# Patient Record
Sex: Female | Born: 1935 | Hispanic: No | Smoking: Former smoker
Health system: Southern US, Community
[De-identification: ages and names within clinical notes are randomized; demographics above are authoritative.]

## PROBLEM LIST (undated history)

## (undated) DIAGNOSIS — E785 Hyperlipidemia, unspecified: Secondary | ICD-10-CM

## (undated) DIAGNOSIS — Z7409 Other reduced mobility: Secondary | ICD-10-CM

## (undated) DIAGNOSIS — I10 Essential (primary) hypertension: Secondary | ICD-10-CM

## (undated) DIAGNOSIS — M4802 Spinal stenosis, cervical region: Secondary | ICD-10-CM

## (undated) DIAGNOSIS — R011 Cardiac murmur, unspecified: Secondary | ICD-10-CM

## (undated) DIAGNOSIS — R338 Other retention of urine: Secondary | ICD-10-CM

## (undated) DIAGNOSIS — Z741 Need for assistance with personal care: Secondary | ICD-10-CM

## (undated) DIAGNOSIS — K449 Diaphragmatic hernia without obstruction or gangrene: Secondary | ICD-10-CM

## (undated) DIAGNOSIS — R627 Adult failure to thrive: Secondary | ICD-10-CM

## (undated) DIAGNOSIS — I739 Peripheral vascular disease, unspecified: Secondary | ICD-10-CM

## (undated) DIAGNOSIS — M1990 Unspecified osteoarthritis, unspecified site: Secondary | ICD-10-CM

## (undated) DIAGNOSIS — D638 Anemia in other chronic diseases classified elsewhere: Secondary | ICD-10-CM

## (undated) DIAGNOSIS — R1312 Dysphagia, oropharyngeal phase: Secondary | ICD-10-CM

## (undated) DIAGNOSIS — M62838 Other muscle spasm: Secondary | ICD-10-CM

## (undated) DIAGNOSIS — M1711 Unilateral primary osteoarthritis, right knee: Secondary | ICD-10-CM

## (undated) DIAGNOSIS — M6281 Muscle weakness (generalized): Secondary | ICD-10-CM

## (undated) DIAGNOSIS — E440 Moderate protein-calorie malnutrition: Secondary | ICD-10-CM

## (undated) DIAGNOSIS — I621 Nontraumatic extradural hemorrhage: Secondary | ICD-10-CM

## (undated) DIAGNOSIS — I700 Atherosclerosis of aorta: Secondary | ICD-10-CM

## (undated) DIAGNOSIS — U071 COVID-19: Secondary | ICD-10-CM

## (undated) HISTORY — DX: Hyperlipidemia, unspecified: E78.5

## (undated) HISTORY — PX: ABDOMINAL HYSTERECTOMY: SHX81

## (undated) HISTORY — DX: Peripheral vascular disease, unspecified: I73.9

## (undated) HISTORY — DX: Unspecified osteoarthritis, unspecified site: M19.90

## (undated) HISTORY — PX: CHOLECYSTECTOMY: SHX55

## (undated) HISTORY — DX: Cardiac murmur, unspecified: R01.1

## (undated) HISTORY — DX: Essential (primary) hypertension: I10

## (undated) HISTORY — DX: Diaphragmatic hernia without obstruction or gangrene: K44.9

---

## 2006-09-19 ENCOUNTER — Ambulatory Visit: Payer: Self-pay | Admitting: Vascular Surgery

## 2007-03-19 ENCOUNTER — Ambulatory Visit: Payer: Self-pay | Admitting: Vascular Surgery

## 2007-09-17 ENCOUNTER — Ambulatory Visit: Payer: Self-pay | Admitting: Vascular Surgery

## 2008-03-24 ENCOUNTER — Ambulatory Visit: Payer: Self-pay | Admitting: Vascular Surgery

## 2008-08-25 ENCOUNTER — Ambulatory Visit: Payer: Self-pay | Admitting: Vascular Surgery

## 2009-03-18 ENCOUNTER — Ambulatory Visit: Payer: Self-pay | Admitting: Vascular Surgery

## 2009-09-16 ENCOUNTER — Ambulatory Visit: Payer: Self-pay | Admitting: Vascular Surgery

## 2010-03-09 ENCOUNTER — Ambulatory Visit: Payer: Self-pay | Admitting: Vascular Surgery

## 2010-08-09 NOTE — Consult Note (Signed)
VASCULAR SURGERY CONSULTATION   DOAA, KENDZIERSKI D  DOB:  1929-12-18                                       09/19/2006  ZHYQM#:[id number]   I saw Ms. Farney in the office today in consultation concerning her left  lower extremity claudication.  She was referred by Dr. Nicholos Johns.  She states that for approximately 6 months she has noted that the left  leg gets tired when she ambulates.  This occurs at a fairly short  distance.  Her symptoms appear to be mostly involving the thigh and  calf.  She has had minimal symptoms in the right leg with no specific  claudication symptoms on the right.  She has had no rest pain and no  history of nonhealing wounds.  She has had some arthritis pain in the  right knee.  Her symptoms have remained relatively stable over the last  6 months.   PAST MEDICAL HISTORY:  Significant for adult-onset diabetes,  hypertension, and hypercholesterolemia.  She denies any history of  previous myocardial infarction, a history of congestive heart failure,  or history of COPD.   PAST SURGICAL HISTORY:  Her past surgical history is significant for  previous hysterectomy and cholecystectomy.   FAMILY HISTORY:  On the family history, there is no history of premature  cardiovascular disease.   SOCIAL HISTORY:  On social history, she is widowed.  She smokes a pack  per day of cigarettes but quit three years ago.   REVIEW OF SYSTEMS AND MEDICATIONS:  Documented on the medical history  form in her chart.   PHYSICAL EXAMINATION:  Vital Signs:  On physical examination, blood  pressure is 179/78, heart rate is 62.  HEENT:  I did not detect any  carotid bruits.  Lungs:  Clear bilaterally to auscultation.  Cardiac:  On cardiac exam, she has a regular rate and rhythm.  Abdomen:  Her  abdomen is soft and nontender.  I could not palpate an aneurysm.  She  has a normal right femoral pulse.  I could not palpate a left femoral  pulse.  Extremities:  She has a  palpable popliteal and dorsalis pedis  pulse on the right.  She has brisk Doppler signals in the right foot.  On the left side, I could not palpate any popliteal or pedal pulses.   She had a Doppler study at [facility] on September 11, 2006 which  showed an ABI of 94% on the right and 53% on the left.  Of note, she has  no ischemic ulcers on her feet.   Based on her exam, she has essentially normal flow in the right leg.  On  the left side, she has evidence of iliac artery occlusive disease and  possible infrainguinal arterio-occlusive disease.  However, currently  her symptoms are quite stable and I would not recommend arteriography  and revascularization unless she developed disabling claudication, or  rest pain, or nonhealing ulcer.  We have discussed the importance of a  structured walking program and she is willing to give this a try.  I  plan on seeing her back in 6 months to follow her progress.  She knows  to call sooner if she has problems.  The other consideration will  cilostazol however for now she is content to trying the structured  walking program.   Di Kindle.  Edilia Bo, M.D.  Electronically Signed  CSD/MEDQ  D:  09/19/2006  T:  09/20/2006  Job:  [id number]   cc:   Georgianne Fick, M.D.

## 2010-08-09 NOTE — Consult Note (Signed)
NEW PATIENT CONSULTATION   Suzanne Fox, Suzanne Fox  DOB:  December 09, 1929                                       03/09/2010  ZOXWR#:[id number]   I saw this patient in the office today to evaluate her left leg  claudication.  This is a pleasant 77 year old woman with a long history  of claudication in the left leg.  She states that the left leg becomes  tired when she ambulates and this is relieved with rest.  She denies any  specific pain in he leg but says the leg simply gets tired.  This has  gone on for several years and heart rate symptoms have been fairly  stable over the last 2 years.  Symptoms are brought on by ambulation at  approximately 3 blocks.  Symptoms are relieved with rest.  There are no  other aggravating or alleviating factors.  She had no rest pain.  No  history of nonhealing wounds.  Her symptoms only involve the left leg.  She has had no symptoms on the right.   PAST MEDICAL HISTORY:  Significant for hypertension and  hypercholesterolemia.  She also has adult onset diabetes, but does not  require insulin.  She has had no previous history of myocardial  infarction, history of congestive heart failure, or history of COPD.   SOCIAL HISTORY:  She is widowed.  She does not have any children.  She  quit tobacco 5 years ago.   FAMILY HISTORY:  There is no history of premature cardiovascular  disease.   REVIEW OF SYSTEMS:  GENERAL:  She has had no recent weight loss, weight  gain or problems with her appetite.  CARDIOVASCULAR:  She has had no  chest pain, chest pressure, palpitations or arrhythmias.  She had no  claudication, rest pain or nonhealing ulcers.  She had no history of  stroke or TIAs.  She has had no history of DVT or phlebitis.  GI:  She  has a hiatal hernia and has also had problems with constipation.  NEUROLOGIC, PULMONARY, HEMATOLOGIC, GU, ENT, MUSCULOSKELETAL,  PSYCHIATRIC, INTEGUMENTARY review of systems is unremarkable as  documented in the  medical history form in her chart.   PHYSICAL EXAMINATION:  This is a pleasant 77 year old woman who appears  her stated age.  Blood pressure 162/74, heart rate is 55, saturation  98%.  HEENT:  Unremarkable.  Lungs:  Clear bilaterally to auscultation  without rales, rhonchi or wheezing.  Cardiovascular exam:  I do not  detect any carotid bruits.  She has a regular rate and rhythm.  She has  a palpable right femoral pulse.  I cannot palpate a left femoral pulse.  She has a palpable dorsalis pedis and posterior tibial pulse on the  right that is slightly diminished.  I cannot palpate pedal pulses on the  left side.  She has no significant lower extremity swelling.  Both feet  are warm and well-perfused without ischemic ulcers.  Abdomen:  Soft and nontender with normal pitched bowel sounds.  Musculoskeletal exam:  There is no major deformities or cyanosis.  Neurologic:  She has no focal weakness or paresthesias.  Skin:  There  are no ulcers or rashes.   I have independently interpreted her arterial Doppler study which shows  triphasic waveforms in the dorsalis pedis and posterior tibial positions  on the right with  an ABI of 86%.  On the left side she has monophasic  signals in the posterior tibial and dorsalis pedis positions with an ABI  of 59%.  Given her symptoms are stable,  I would not recommend an  aggressive workup unless she develops rest pain or nonhealing ulcer.  I  have encouraged her to stay as active as possible.  Fortunately she is  not a smoker.  I have ordered a follow-up Doppler study in 18 months and  I will see her back at that time.  She knows to call sooner if she has  problems.     Di Kindle. Edilia Bo, M.Fox.  Electronically Signed   CSD/MEDQ  Fox:  03/09/2010  T:  03/10/2010  Job:  [id number]

## 2010-09-07 ENCOUNTER — Ambulatory Visit: Payer: Medicare Other | Admitting: Vascular Surgery

## 2011-08-28 ENCOUNTER — Encounter: Payer: Self-pay | Admitting: Vascular Surgery

## 2011-09-06 ENCOUNTER — Ambulatory Visit: Payer: Medicare Other | Admitting: Neurosurgery

## 2014-01-08 ENCOUNTER — Encounter (INDEPENDENT_AMBULATORY_CARE_PROVIDER_SITE_OTHER): Payer: Self-pay

## 2014-01-08 ENCOUNTER — Encounter: Payer: Self-pay | Admitting: Neurology

## 2014-01-08 ENCOUNTER — Ambulatory Visit (INDEPENDENT_AMBULATORY_CARE_PROVIDER_SITE_OTHER): Payer: Medicare Other | Admitting: Neurology

## 2014-01-08 VITALS — BP 134/56 | HR 62 | Temp 96.8°F | Ht 60.0 in | Wt 132.0 lb

## 2014-01-08 DIAGNOSIS — H47011 Ischemic optic neuropathy, right eye: Secondary | ICD-10-CM

## 2014-01-08 NOTE — Progress Notes (Addendum)
[facility]    Provider:  Dr Jaynee Fox Referring Provider: Merrilee Seashore, MD Primary Care Physician:  Suzanne Seashore, MD  CC:  Something wrong with my right eye  HPI:  Suzanne Fox is a lovely 80 y.o. female with a PMHx of DM, HTN, arthritis, HLD, hypothyroidism who is here as a referral from Suzanne Fox for optic atrophy. She has been following with an ophthamologist due to a cataract. Patient reports that Suzanne. Kathrin Fox says there is something wrong with her right eye but doesn't know what it is. Has not noticed decreased vision in her right eye. No vision changes. Denies pain chewing, no headache, no stffnes in the shoulders or hips more than her usual arthritis. She feels like she sees well. Sometimes feels a little gritty in her right eye. Has not been in an MRi machine at all. Has diabetes. No hypercoagulability or FHx of this.  Per notes from Suzanne Fox, she under went removal of right eye mature cataract 06/2013. Since then it has become apparent that she has optic atrophy of the right eye. I spoke with Suzanne. Kathrin Fox and appreciate her time, advice and sincere concern for this lovely elderly patient. There is a FHx of glaucoma and blindness in an aunt. Left eye cataract was removed in 2006. Vision in her right eye was 20/400 previous to removing the cataract and 20/20-2 afterwards, left eye 20/25. Visual field test in her right eye showed diffuse and dense visual field depression with some relative sparing of central field. OCT testing showed a markedly thinned retinal nerve fiber layer in right and normal in the left, thinned ganglion cell layer in the macula of the right eye and normal in the left. Intraocular pressures normal in both eyes and no history of elevated pressures or glaucoma. Dilated fundus exam showed diffuse pallor and moderate optic disc cupping in the right eye. No diabetic retinopathy but mildly attenuated retinal vasculature due to  HTN.    No labs available.   Review of Systems: Patient complains of symptoms per HPI as well as the following symptoms blurred vision, easy bruising, constipation, cramps, joint pain. Pertinent negatives per HPI. All others negative.   History   Social History  . Marital Status: Widowed    Spouse Name: N/A    Number of Children: 0  . Years of Education: 12   Occupational History  . Retired    Social History Main Topics  . Smoking status: Former Smoker -- 1.00 packs/day for 50 years    Types: Cigarettes    Quit date: 08/27/2004  . Smokeless tobacco: Never Used  . Alcohol Use: No  . Drug Use: No  . Sexual Activity: Not on file   Other Topics Concern  . Not on file   Social History Narrative   Patient lives at home alone.   Caffeine use: 4 cups daily    Family History  Problem Relation Age of Onset  . Other Mother     At childbirth  . Prostate cancer Father   . Asthma Sister   . Diabetes Sister   . Cancer Maternal Aunt   . Diabetes Sister   . Diabetes Sister     Past Medical History  Diagnosis Date  . Hypertension   . Diabetes mellitus   . Hyperlipidemia   . Hiatal hernia   . Peripheral vascular disease   . Arthritis     Past Surgical History  Procedure Laterality Date  . Cholecystectomy    .  Abdominal hysterectomy      Current Outpatient Prescriptions  Medication Sig Dispense Refill  . amLODipine (NORVASC) 10 MG tablet Take 10 mg by mouth daily.      Marland Kitchen aspirin EC 81 MG tablet Take 81 mg by mouth every other day.      Marland Kitchen atenolol-chlorthalidone (TENORETIC) 50-25 MG per tablet Take 1 tablet by mouth daily.      Marland Kitchen atorvastatin (LIPITOR) 40 MG tablet Take 40 mg by mouth daily.      . cilostazol (PLETAL) 50 MG tablet Take 50 mg by mouth 2 (two) times daily.      . clobetasol cream (TEMOVATE) 6.78 % Apply 1 application topically 2 (two) times daily.      Marland Kitchen glipiZIDE (GLUCOTROL) 10 MG tablet Take 20 mg by mouth daily.       Marland Kitchen guanFACINE (TENEX) 1 MG  tablet Take 2 mg by mouth at bedtime.      Marland Kitchen levothyroxine (SYNTHROID, LEVOTHROID) 25 MCG tablet Take 25 mcg by mouth daily.      Marland Kitchen lisinopril (PRINIVIL,ZESTRIL) 40 MG tablet Take 80 mg by mouth daily.       . metFORMIN (GLUCOPHAGE-XR) 500 MG 24 hr tablet Take 1,000 mg by mouth 2 (two) times daily.        No current facility-administered medications for this visit.    Allergies as of 01/08/2014  . (No Known Allergies)    Vitals: BP 134/56  Pulse 62  Temp(Src) 96.8 F (36 C) (Oral)  Ht 5' (1.524 m)  Wt 132 lb (59.875 kg)  BMI 25.78 kg/m2 Last Weight:  Wt Readings from Last 1 Encounters:  01/08/14 132 lb (59.875 kg)   Last Height:   Ht Readings from Last 1 Encounters:  01/08/14 5' (1.524 m)    Physical exam: Exam: Gen: NAD, conversant, well nourised, well groomed                     CV: RRR, no MRG. No Carotid Bruits. No peripheral edema, warm, nontender Eyes: Conjunctivae clear without exudates or hemorrhage  Neuro: Detailed Neurologic Exam  Speech:    Speech is normal; fluent and spontaneous with normal comprehension.  Cognition:    The patient is oriented to person, place, and time;     recent and remote memory intact;     language fluent;     normal attention, concentration,    fund of knowledge Cranial Nerves:    Right APD.  Pale right optic nerve. Visual fields were impaired bilaterally, she could not see fingers when looking at my nose however she did look in the correct quadrant indicating she does have some intact peripheral vision. 20/30 bilaterally.  Extraocular movements are intact. Trigeminal sensation is intact and the muscles of mastication are normal. The face is symmetric. The palate elevates in the midline. Voice is normal. Shoulder shrug is normal. The tongue has normal motion without fasciculations.   Coordination:    Normal finger to nose, no dysmetria noted  Gait:    Slow, mildly wide based  Motor Observation:    No asymmetry, no atrophy, and  no involuntary movements noted.  Tone:    Normal muscle tone.    Posture:    Posture is stooped due to kyphosis    Strength:    Strength is V/V in the upper and lower limbs.      Sensation: intact to LT     Reflex Exam:  DTR's:    Right achilles  absent, otherwise deep tendon reflexes in the upper and lower extremities are normal bilaterally.   Toes:    Left toe upgoing? Clonus:    Clonus is absent.    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 Denies prodrome of systemic symptoms known as polymyalgia rheumatica, which include jaw claudication, proximal myalgias and arthralgias, scalp tenderness, headache, and fatigue. Denies dizziness or any hx of hypotension. Will order carotid dopplers to rule out atherosclerotic carotid disease. Will order MRI of the brain to rule out any compressive etiology. Need records from her primary care to see if they have checked esr or crp recently and will need renal function for contrast.    Sarina Ill, MD  [facility] [address] [place], [place] [address]  Phone [phone] Fax [phone]    Lenor Coffin

## 2014-01-08 NOTE — Patient Instructions (Signed)
As far as diagnostic testing: MRI of the brain and eyes and MRA of the vessels  I would like to see you back after results to discuss, sooner if we need to. Please call us with any interim questions, concerns, problems, updates or refill requests.   Please also call us for any test results so we can go over those with you on the phone.  My clinical assistant and will answer any of your questions and relay your messages to me and also relay most of my messages to you.   Our phone number is [phone]. We also have an after hours call service for urgent matters and there is a physician on-call for urgent questions. For any emergencies you know to call 911 or go to the nearest emergency room

## 2014-01-11 ENCOUNTER — Telehealth: Payer: Self-pay | Admitting: Neurology

## 2014-01-11 ENCOUNTER — Encounter: Payer: Self-pay | Admitting: Neurology

## 2014-01-11 DIAGNOSIS — H47019 Ischemic optic neuropathy, unspecified eye: Secondary | ICD-10-CM | POA: Insufficient documentation

## 2014-01-11 NOTE — Telephone Encounter (Signed)
Larey Seat - Would you call Dr. Mathis Fare office and get the most recent labs for this patient please? Will need renal function (so a BMP or a CMP) and an esr and crp. This is her PCP and he referred her here in conjunction with Dr. Kathrin Penner for her optic atrophy. If they don't have recent labs, let me know and we will have her back to get them done. Thanks.

## 2014-01-12 ENCOUNTER — Other Ambulatory Visit: Payer: Self-pay | Admitting: Neurology

## 2014-01-12 ENCOUNTER — Telehealth: Payer: Self-pay | Admitting: Neurology

## 2014-01-12 DIAGNOSIS — H4720 Unspecified optic atrophy: Secondary | ICD-10-CM

## 2014-01-12 DIAGNOSIS — H47011 Ischemic optic neuropathy, right eye: Secondary | ICD-10-CM

## 2014-01-12 NOTE — Telephone Encounter (Signed)
Called patient. Went over Dr. Trevor MaceAhern's previous note. Patient agreed to come to our office for labs by the end of this week.

## 2014-01-12 NOTE — Telephone Encounter (Signed)
Notes received

## 2014-01-12 NOTE — Telephone Encounter (Signed)
Larey Seat - would you please call this lovely patient and let her know that after reviewing the labs that she had done at Dr. Mathis Fare office, there are 2 more we really need. Would you have her go to [facility] or come here at her earliest convenience please? (ESR and CRP) Thank you.

## 2014-01-12 NOTE — Telephone Encounter (Signed)
Dr. Lucia GaskinsAhern spoke with Dr. Carolyn Stareamachandran's nurse. She will fax info needed.

## 2014-01-15 ENCOUNTER — Other Ambulatory Visit (INDEPENDENT_AMBULATORY_CARE_PROVIDER_SITE_OTHER): Payer: Medicare Other

## 2014-01-15 ENCOUNTER — Ambulatory Visit (INDEPENDENT_AMBULATORY_CARE_PROVIDER_SITE_OTHER): Payer: Medicare Other

## 2014-01-15 ENCOUNTER — Other Ambulatory Visit: Payer: Self-pay | Admitting: Neurology

## 2014-01-15 DIAGNOSIS — H4720 Unspecified optic atrophy: Secondary | ICD-10-CM

## 2014-01-15 DIAGNOSIS — H47011 Ischemic optic neuropathy, right eye: Secondary | ICD-10-CM

## 2014-01-15 DIAGNOSIS — Z0289 Encounter for other administrative examinations: Secondary | ICD-10-CM

## 2014-01-16 LAB — SEDIMENTATION RATE: Sed Rate: 5 mm/hr (ref 0–40)

## 2014-01-16 LAB — BASIC METABOLIC PANEL
BUN/Creatinine Ratio: 13 (ref 11–26)
BUN: 8 mg/dL (ref 8–27)
CO2: 24 mmol/L (ref 18–29)
Calcium: 10.1 mg/dL (ref 8.7–10.3)
Chloride: 95 mmol/L — ABNORMAL LOW (ref 97–108)
Creatinine, Ser: 0.63 mg/dL (ref 0.57–1.00)
GFR calc Af Amer: 97 mL/min/{1.73_m2} (ref >59)
GFR calc non Af Amer: 84 mL/min/{1.73_m2} (ref >59)
Glucose: 122 mg/dL — ABNORMAL HIGH (ref 65–99)
Potassium: 4.2 mmol/L (ref 3.5–5.2)
Sodium: 136 mmol/L (ref 134–144)

## 2014-01-16 LAB — C-REACTIVE PROTEIN: CRP: 0.4 mg/L (ref 0.0–4.9)

## 2014-01-21 ENCOUNTER — Ambulatory Visit
Admission: RE | Admit: 2014-01-21 | Discharge: 2014-01-21 | Disposition: A | Discharge status: Home / Self Care | Payer: 59 | Source: Ambulatory Visit | Attending: Neurology | Admitting: Neurology

## 2014-01-21 ENCOUNTER — Telehealth: Payer: Self-pay

## 2014-01-21 DIAGNOSIS — H47011 Ischemic optic neuropathy, right eye: Secondary | ICD-10-CM

## 2014-01-21 MED ORDER — GADOBENATE DIMEGLUMINE 529 MG/ML IV SOLN
12.0000 mL | Freq: Once | INTRAVENOUS | Status: AC | PRN
  Administered 2014-01-21: 12 mL via INTRAVENOUS

## 2014-01-21 NOTE — Telephone Encounter (Signed)
Message copied by Doree BarthelLOWE, Jedadiah Abdallah on Wed Jan 21, 2014 11:47 AM ------      Message from: Naomie DeanAHERN, ANTONIA B      Created: Fri Jan 16, 2014  2:39 PM       Would you let patient know that her lab tests done in our office were normal. Also remind her that we ordered images of her brain (MRIs). Thank you. ------

## 2014-01-21 NOTE — Telephone Encounter (Signed)
Called patient. No answer. Will try later.  

## 2014-01-22 NOTE — Telephone Encounter (Signed)
Called patient.  No answer.

## 2014-01-28 ENCOUNTER — Telehealth: Payer: Self-pay

## 2014-01-28 NOTE — Telephone Encounter (Signed)
-----   Message from Anson FretAntonia B Ahern, MD sent at 01/26/2014 11:14 AM EST ----- Please let patient know that her MRIs were not unremarkable, not concerning for any significant issues.

## 2014-01-28 NOTE — Telephone Encounter (Signed)
Called patient and gave results.

## 2014-02-18 ENCOUNTER — Telehealth: Payer: Self-pay | Admitting: Neurology

## 2014-02-18 NOTE — Telephone Encounter (Signed)
Reviewed with Dr. Pearlean BrownieSethi and called patient. There is stenosis in the external carotid artery but there is no intervention for this and it does not increase stroke risk or cause patient's symptoms (optic neuropathy).

## 2017-08-23 ENCOUNTER — Telehealth: Payer: Self-pay | Admitting: Pharmacist

## 2017-08-23 NOTE — Patient Outreach (Signed)
[facility]) Care Management  08/23/2017  Suzanne Fox [birth year] [id number]  84 year old female on triple-fail measure medication adherence list from [facility].  Per [facility] records, the following medications are due to be refilled.      glipizide: last filled 05/24/2017 #30 DS $9:60  lisinopril  last filled 05/24/2017 #30 DS: $7.86  atorvastatin: last filled 05/25/2017 #30 DS $9.60  Unsuccessful call placed to Ms. Suzanne Fox.  Home phone number on file was no longer in service.  No other phone number noted in [facility].  Call placed to [facility] and new contact information given.  Successful call to Ms. Suzanne Fox on new home phone number.  HIPAA identifiers verified.   Patient reports that she is filling her medications but due to a "fixed income" she cannot afford more than 30 day fills at a time.  We reviewed cost-savings with 90 day supplies and with mail order.  Patient voiced understanding.  Call placed to [facility] to confirm medications were filled and all were ready to be picked up.  Based on pricing, patient may be in the coverage gap.  Patient plans to pick up medications within the next few days.     Plan: I will close [facility] pharmacy case.  No further intervention warranted at this time.    Haynes Hoehn, PharmD, [facility] Clinical Pharmacist [facility] [employer] [phone]

## 2019-09-05 ENCOUNTER — Ambulatory Visit: Payer: Medicare PPO | Admitting: Podiatry

## 2019-09-05 ENCOUNTER — Other Ambulatory Visit: Payer: Self-pay

## 2019-09-05 ENCOUNTER — Encounter: Payer: Self-pay | Admitting: Podiatry

## 2019-09-05 DIAGNOSIS — M79675 Pain in left toe(s): Secondary | ICD-10-CM | POA: Diagnosis not present

## 2019-09-05 DIAGNOSIS — M79674 Pain in right toe(s): Secondary | ICD-10-CM

## 2019-09-05 DIAGNOSIS — B351 Tinea unguium: Secondary | ICD-10-CM

## 2019-09-05 NOTE — Progress Notes (Signed)
This patient returns to my office for at risk foot care.  This patient requires this care by a professional since this patient will be at risk due to having  Diabetes.  This patient presents to the office with her niece.   This patient is unable to cut nails himself since the patient cannot reach his nails.These nails are painful walking and wearing shoes.  This patient presents for at risk foot care today.  General Appearance  Alert, conversant and in no acute stress.  Vascular  Dorsalis pedis are weakly  pulses are palpable  Bilaterally. Posterior tibial pulses are not palpable  B/L.   Capillary return is within normal limits  bilaterally. Temperature is within normal limits  bilaterally.  Neurologic  Senn-Weinstein monofilament wire test within normal limits  bilaterally. Muscle power within normal limits bilaterally.  Nails Thick disfigured discolored nails with subungual debris  from hallux to fifth toes bilaterally. No evidence of bacterial infection or drainage bilaterally.  Orthopedic  No limitations of motion  feet .  No crepitus or effusions noted.  No bony pathology or digital deformities noted. HAV  B/L.  Pes planus  B/L.  Skin  normotropic skin with no porokeratosis noted bilaterally.  No signs of infections or ulcers noted.     Onychomycosis  Pain in right toes  Pain in left toes  Consent was obtained for treatment procedures.   Mechanical debridement of nails 1-5  bilaterally performed with a nail nipper.  Filed with dremel without incident.    Return office visit  3 months                    Told patient to return for periodic foot care and evaluation due to potential at risk complications.   Helane Gunther DPM

## 2019-09-24 ENCOUNTER — Ambulatory Visit: Payer: Medicare PPO | Admitting: Podiatry

## 2019-12-12 ENCOUNTER — Ambulatory Visit: Payer: Medicare PPO | Admitting: Podiatry

## 2019-12-12 ENCOUNTER — Encounter: Payer: Self-pay | Admitting: Podiatry

## 2019-12-12 ENCOUNTER — Other Ambulatory Visit: Payer: Self-pay

## 2019-12-12 DIAGNOSIS — M79674 Pain in right toe(s): Secondary | ICD-10-CM | POA: Diagnosis not present

## 2019-12-12 DIAGNOSIS — M79675 Pain in left toe(s): Secondary | ICD-10-CM | POA: Diagnosis not present

## 2019-12-12 DIAGNOSIS — B351 Tinea unguium: Secondary | ICD-10-CM | POA: Diagnosis not present

## 2019-12-12 NOTE — Progress Notes (Signed)
This patient returns to my office for at risk foot care.  This patient requires this care by a professional since this patient will be at risk due to having  Diabetes.  This patient presents to the office with her niece.    This patient is unable to cut nails herself since the patient cannot reach her nails.These nails are painful walking and wearing shoes.  This patient presents for at risk foot care today.  General Appearance  Alert, conversant and in no acute stress.  Vascular  Dorsalis pedis are weakly  pulses are palpable  Bilaterally. Posterior tibial pulses are not palpable  B/L.   Capillary return is within normal limits  bilaterally. Temperature is within normal limits  bilaterally.  Neurologic  Senn-Weinstein monofilament wire test within normal limits  bilaterally. Muscle power within normal limits bilaterally.  Nails Thick disfigured discolored nails with subungual debris  from hallux to fifth toes bilaterally. No evidence of bacterial infection or drainage bilaterally.  Orthopedic  No limitations of motion  feet .  No crepitus or effusions noted.  No bony pathology or digital deformities noted. HAV  B/L.  Pes planus  B/L.  Skin  normotropic skin with no porokeratosis noted bilaterally.  No signs of infections or ulcers noted.     Onychomycosis  Pain in right toes  Pain in left toes  Consent was obtained for treatment procedures.   Mechanical debridement of nails 1-5  bilaterally performed with a nail nipper.  Filed with dremel without incident.    Return office visit  3 months                    Told patient to return for periodic foot care and evaluation due to potential at risk complications.   Helane Gunther DPM

## 2020-03-16 ENCOUNTER — Other Ambulatory Visit: Payer: Self-pay

## 2020-03-16 ENCOUNTER — Ambulatory Visit (INDEPENDENT_AMBULATORY_CARE_PROVIDER_SITE_OTHER): Payer: Medicare PPO | Admitting: Podiatry

## 2020-03-16 DIAGNOSIS — B351 Tinea unguium: Secondary | ICD-10-CM

## 2020-03-16 DIAGNOSIS — M79674 Pain in right toe(s): Secondary | ICD-10-CM

## 2020-03-16 DIAGNOSIS — M79675 Pain in left toe(s): Secondary | ICD-10-CM

## 2020-03-23 DIAGNOSIS — H34832 Tributary (branch) retinal vein occlusion, left eye, with macular edema: Secondary | ICD-10-CM | POA: Diagnosis not present

## 2020-03-23 DIAGNOSIS — H35373 Puckering of macula, bilateral: Secondary | ICD-10-CM | POA: Diagnosis not present

## 2020-03-23 DIAGNOSIS — H35033 Hypertensive retinopathy, bilateral: Secondary | ICD-10-CM | POA: Diagnosis not present

## 2020-03-23 DIAGNOSIS — H35361 Drusen (degenerative) of macula, right eye: Secondary | ICD-10-CM | POA: Diagnosis not present

## 2020-03-24 ENCOUNTER — Encounter: Payer: Self-pay | Admitting: Podiatry

## 2020-03-24 NOTE — Progress Notes (Signed)
Pt. Left without being seen

## 2020-04-14 ENCOUNTER — Ambulatory Visit: Payer: Medicare PPO | Admitting: Podiatry

## 2020-06-09 DIAGNOSIS — I10 Essential (primary) hypertension: Secondary | ICD-10-CM | POA: Diagnosis not present

## 2020-06-09 DIAGNOSIS — R5383 Other fatigue: Secondary | ICD-10-CM | POA: Diagnosis not present

## 2020-06-09 DIAGNOSIS — E1165 Type 2 diabetes mellitus with hyperglycemia: Secondary | ICD-10-CM | POA: Diagnosis not present

## 2020-06-09 DIAGNOSIS — M542 Cervicalgia: Secondary | ICD-10-CM | POA: Diagnosis not present

## 2020-06-10 ENCOUNTER — Emergency Department (HOSPITAL_COMMUNITY): Payer: Medicare PPO

## 2020-06-10 ENCOUNTER — Inpatient Hospital Stay (HOSPITAL_COMMUNITY)
Admission: EM | Admit: 2020-06-10 | Discharge: 2020-06-18 | DRG: 471 | Disposition: A | Discharge status: Skilled Nursing Facility | Payer: Medicare PPO | Attending: Family Medicine | Admitting: Family Medicine

## 2020-06-10 ENCOUNTER — Encounter (HOSPITAL_COMMUNITY): Payer: Self-pay | Admitting: Emergency Medicine

## 2020-06-10 ENCOUNTER — Observation Stay (HOSPITAL_COMMUNITY): Payer: Medicare PPO | Admitting: Anesthesiology

## 2020-06-10 ENCOUNTER — Observation Stay (HOSPITAL_COMMUNITY): Payer: Medicare PPO

## 2020-06-10 ENCOUNTER — Ambulatory Visit (HOSPITAL_COMMUNITY)
Admission: EM | Disposition: A | Discharge status: Skilled Nursing Facility | Payer: Self-pay | Source: Home / Self Care | Attending: Pulmonary Disease

## 2020-06-10 ENCOUNTER — Other Ambulatory Visit: Payer: Self-pay

## 2020-06-10 DIAGNOSIS — G8314 Monoplegia of lower limb affecting left nondominant side: Secondary | ICD-10-CM | POA: Diagnosis present

## 2020-06-10 DIAGNOSIS — E876 Hypokalemia: Secondary | ICD-10-CM | POA: Diagnosis present

## 2020-06-10 DIAGNOSIS — R339 Retention of urine, unspecified: Secondary | ICD-10-CM | POA: Diagnosis present

## 2020-06-10 DIAGNOSIS — D638 Anemia in other chronic diseases classified elsewhere: Secondary | ICD-10-CM | POA: Diagnosis present

## 2020-06-10 DIAGNOSIS — Z20822 Contact with and (suspected) exposure to covid-19: Secondary | ICD-10-CM | POA: Diagnosis present

## 2020-06-10 DIAGNOSIS — R531 Weakness: Secondary | ICD-10-CM | POA: Diagnosis not present

## 2020-06-10 DIAGNOSIS — I10 Essential (primary) hypertension: Secondary | ICD-10-CM | POA: Diagnosis present

## 2020-06-10 DIAGNOSIS — G8191 Hemiplegia, unspecified affecting right dominant side: Secondary | ICD-10-CM | POA: Diagnosis present

## 2020-06-10 DIAGNOSIS — S064X9A Epidural hemorrhage with loss of consciousness of unspecified duration, initial encounter: Secondary | ICD-10-CM | POA: Diagnosis present

## 2020-06-10 DIAGNOSIS — I621 Nontraumatic extradural hemorrhage: Secondary | ICD-10-CM | POA: Diagnosis not present

## 2020-06-10 DIAGNOSIS — Z87891 Personal history of nicotine dependence: Secondary | ICD-10-CM | POA: Diagnosis not present

## 2020-06-10 DIAGNOSIS — M7551 Bursitis of right shoulder: Secondary | ICD-10-CM | POA: Diagnosis present

## 2020-06-10 DIAGNOSIS — S14109A Unspecified injury at unspecified level of cervical spinal cord, initial encounter: Secondary | ICD-10-CM | POA: Diagnosis present

## 2020-06-10 DIAGNOSIS — G9589 Other specified diseases of spinal cord: Secondary | ICD-10-CM | POA: Diagnosis not present

## 2020-06-10 DIAGNOSIS — F0390 Unspecified dementia without behavioral disturbance: Secondary | ICD-10-CM | POA: Diagnosis present

## 2020-06-10 DIAGNOSIS — M4802 Spinal stenosis, cervical region: Secondary | ICD-10-CM | POA: Diagnosis present

## 2020-06-10 DIAGNOSIS — M47812 Spondylosis without myelopathy or radiculopathy, cervical region: Secondary | ICD-10-CM | POA: Diagnosis not present

## 2020-06-10 DIAGNOSIS — H9190 Unspecified hearing loss, unspecified ear: Secondary | ICD-10-CM | POA: Diagnosis present

## 2020-06-10 DIAGNOSIS — Z741 Need for assistance with personal care: Secondary | ICD-10-CM | POA: Diagnosis not present

## 2020-06-10 DIAGNOSIS — Z888 Allergy status to other drugs, medicaments and biological substances status: Secondary | ICD-10-CM

## 2020-06-10 DIAGNOSIS — E785 Hyperlipidemia, unspecified: Secondary | ICD-10-CM | POA: Diagnosis present

## 2020-06-10 DIAGNOSIS — E43 Unspecified severe protein-calorie malnutrition: Secondary | ICD-10-CM | POA: Insufficient documentation

## 2020-06-10 DIAGNOSIS — E039 Hypothyroidism, unspecified: Secondary | ICD-10-CM | POA: Diagnosis present

## 2020-06-10 DIAGNOSIS — Z419 Encounter for procedure for purposes other than remedying health state, unspecified: Secondary | ICD-10-CM

## 2020-06-10 DIAGNOSIS — X58XXXA Exposure to other specified factors, initial encounter: Secondary | ICD-10-CM | POA: Diagnosis present

## 2020-06-10 DIAGNOSIS — E1151 Type 2 diabetes mellitus with diabetic peripheral angiopathy without gangrene: Secondary | ICD-10-CM | POA: Diagnosis present

## 2020-06-10 DIAGNOSIS — J9601 Acute respiratory failure with hypoxia: Secondary | ICD-10-CM | POA: Diagnosis not present

## 2020-06-10 DIAGNOSIS — Z79890 Hormone replacement therapy (postmenopausal): Secondary | ICD-10-CM

## 2020-06-10 DIAGNOSIS — E1165 Type 2 diabetes mellitus with hyperglycemia: Secondary | ICD-10-CM | POA: Diagnosis present

## 2020-06-10 DIAGNOSIS — S46119A Strain of muscle, fascia and tendon of long head of biceps, unspecified arm, initial encounter: Secondary | ICD-10-CM | POA: Diagnosis present

## 2020-06-10 DIAGNOSIS — M25511 Pain in right shoulder: Secondary | ICD-10-CM | POA: Diagnosis not present

## 2020-06-10 DIAGNOSIS — E871 Hypo-osmolality and hyponatremia: Secondary | ICD-10-CM | POA: Diagnosis present

## 2020-06-10 DIAGNOSIS — Z4682 Encounter for fitting and adjustment of non-vascular catheter: Secondary | ICD-10-CM | POA: Diagnosis not present

## 2020-06-10 DIAGNOSIS — M2550 Pain in unspecified joint: Secondary | ICD-10-CM | POA: Diagnosis not present

## 2020-06-10 DIAGNOSIS — M6281 Muscle weakness (generalized): Secondary | ICD-10-CM | POA: Diagnosis not present

## 2020-06-10 DIAGNOSIS — Z833 Family history of diabetes mellitus: Secondary | ICD-10-CM | POA: Diagnosis not present

## 2020-06-10 DIAGNOSIS — E782 Mixed hyperlipidemia: Secondary | ICD-10-CM | POA: Diagnosis not present

## 2020-06-10 DIAGNOSIS — Z7982 Long term (current) use of aspirin: Secondary | ICD-10-CM | POA: Diagnosis not present

## 2020-06-10 DIAGNOSIS — Z48811 Encounter for surgical aftercare following surgery on the nervous system: Secondary | ICD-10-CM | POA: Diagnosis not present

## 2020-06-10 DIAGNOSIS — I959 Hypotension, unspecified: Secondary | ICD-10-CM | POA: Diagnosis not present

## 2020-06-10 DIAGNOSIS — Z981 Arthrodesis status: Secondary | ICD-10-CM | POA: Diagnosis not present

## 2020-06-10 DIAGNOSIS — E860 Dehydration: Secondary | ICD-10-CM | POA: Diagnosis not present

## 2020-06-10 DIAGNOSIS — Z825 Family history of asthma and other chronic lower respiratory diseases: Secondary | ICD-10-CM

## 2020-06-10 DIAGNOSIS — J988 Other specified respiratory disorders: Secondary | ICD-10-CM | POA: Diagnosis not present

## 2020-06-10 DIAGNOSIS — Z043 Encounter for examination and observation following other accident: Secondary | ICD-10-CM | POA: Diagnosis not present

## 2020-06-10 DIAGNOSIS — R509 Fever, unspecified: Secondary | ICD-10-CM | POA: Diagnosis not present

## 2020-06-10 DIAGNOSIS — Z79899 Other long term (current) drug therapy: Secondary | ICD-10-CM | POA: Diagnosis not present

## 2020-06-10 DIAGNOSIS — M4322 Fusion of spine, cervical region: Secondary | ICD-10-CM | POA: Diagnosis not present

## 2020-06-10 DIAGNOSIS — T68XXXA Hypothermia, initial encounter: Secondary | ICD-10-CM | POA: Diagnosis present

## 2020-06-10 DIAGNOSIS — Z7409 Other reduced mobility: Secondary | ICD-10-CM | POA: Diagnosis not present

## 2020-06-10 DIAGNOSIS — Z7401 Bed confinement status: Secondary | ICD-10-CM | POA: Diagnosis not present

## 2020-06-10 DIAGNOSIS — Z6822 Body mass index (BMI) 22.0-22.9, adult: Secondary | ICD-10-CM

## 2020-06-10 DIAGNOSIS — S064XAA Epidural hemorrhage with loss of consciousness status unknown, initial encounter: Secondary | ICD-10-CM | POA: Diagnosis present

## 2020-06-10 DIAGNOSIS — R5381 Other malaise: Secondary | ICD-10-CM | POA: Diagnosis not present

## 2020-06-10 DIAGNOSIS — Z4659 Encounter for fitting and adjustment of other gastrointestinal appliance and device: Secondary | ICD-10-CM

## 2020-06-10 DIAGNOSIS — M67813 Other specified disorders of tendon, right shoulder: Secondary | ICD-10-CM | POA: Diagnosis not present

## 2020-06-10 HISTORY — PX: POSTERIOR CERVICAL FUSION/FORAMINOTOMY: SHX5038

## 2020-06-10 HISTORY — DX: Hypothyroidism, unspecified: E03.9

## 2020-06-10 HISTORY — DX: Unspecified dementia, unspecified severity, without behavioral disturbance, psychotic disturbance, mood disturbance, and anxiety: F03.90

## 2020-06-10 LAB — RESP PANEL BY RT-PCR (FLU A&B, COVID) ARPGX2
Influenza A by PCR: NEGATIVE
Influenza B by PCR: NEGATIVE
SARS Coronavirus 2 by RT PCR: NEGATIVE

## 2020-06-10 LAB — POCT I-STAT 7, (LYTES, BLD GAS, ICA,H+H)
Acid-Base Excess: 1 mmol/L (ref 0.0–2.0)
Bicarbonate: 26.1 mmol/L (ref 20.0–28.0)
Calcium, Ion: 1.07 mmol/L — ABNORMAL LOW (ref 1.15–1.40)
HCT: 27 % — ABNORMAL LOW (ref 36.0–46.0)
Hemoglobin: 9.2 g/dL — ABNORMAL LOW (ref 12.0–15.0)
O2 Saturation: 100 %
Patient temperature: 32.7
Potassium: 2.5 mmol/L — CL (ref 3.5–5.1)
Sodium: 132 mmol/L — ABNORMAL LOW (ref 135–145)
TCO2: 27 mmol/L (ref 22–32)
pCO2 arterial: 35.8 mmHg (ref 32.0–48.0)
pH, Arterial: 7.452 — ABNORMAL HIGH (ref 7.350–7.450)
pO2, Arterial: 305 mmHg — ABNORMAL HIGH (ref 83.0–108.0)

## 2020-06-10 LAB — URINALYSIS, ROUTINE W REFLEX MICROSCOPIC
Bilirubin Urine: NEGATIVE
Glucose, UA: NEGATIVE mg/dL
Hgb urine dipstick: NEGATIVE
Ketones, ur: 5 mg/dL — AB
Leukocytes,Ua: NEGATIVE
Nitrite: NEGATIVE
Protein, ur: NEGATIVE mg/dL
Specific Gravity, Urine: 1.011 (ref 1.005–1.030)
pH: 5 (ref 5.0–8.0)

## 2020-06-10 LAB — BASIC METABOLIC PANEL
Anion gap: 16 — ABNORMAL HIGH (ref 5–15)
BUN: 15 mg/dL (ref 8–23)
CO2: 19 mmol/L — ABNORMAL LOW (ref 22–32)
Calcium: 9.8 mg/dL (ref 8.9–10.3)
Chloride: 93 mmol/L — ABNORMAL LOW (ref 98–111)
Creatinine, Ser: 0.72 mg/dL (ref 0.44–1.00)
GFR, Estimated: >60 mL/min (ref >60)
Glucose, Bld: 184 mg/dL — ABNORMAL HIGH (ref 70–99)
Potassium: 3.6 mmol/L (ref 3.5–5.1)
Sodium: 128 mmol/L — ABNORMAL LOW (ref 135–145)

## 2020-06-10 LAB — POCT I-STAT, CHEM 8
BUN: 8 mg/dL (ref 8–23)
Calcium, Ion: 1.02 mmol/L — ABNORMAL LOW (ref 1.15–1.40)
Chloride: 89 mmol/L — ABNORMAL LOW (ref 98–111)
Creatinine, Ser: 0.3 mg/dL — ABNORMAL LOW (ref 0.44–1.00)
Glucose, Bld: 205 mg/dL — ABNORMAL HIGH (ref 70–99)
HCT: 29 % — ABNORMAL LOW (ref 36.0–46.0)
Hemoglobin: 9.9 g/dL — ABNORMAL LOW (ref 12.0–15.0)
Potassium: 2.6 mmol/L — CL (ref 3.5–5.1)
Sodium: 130 mmol/L — ABNORMAL LOW (ref 135–145)
TCO2: 24 mmol/L (ref 22–32)

## 2020-06-10 LAB — GLUCOSE, CAPILLARY
Glucose-Capillary: 186 mg/dL — ABNORMAL HIGH (ref 70–99)
Glucose-Capillary: 209 mg/dL — ABNORMAL HIGH (ref 70–99)

## 2020-06-10 LAB — ABO/RH
ABO/RH(D): B POS
ABO/RH(D): B POS

## 2020-06-10 LAB — CBC
HCT: 35.4 % — ABNORMAL LOW (ref 36.0–46.0)
Hemoglobin: 11.1 g/dL — ABNORMAL LOW (ref 12.0–15.0)
MCH: 27.1 pg (ref 26.0–34.0)
MCHC: 31.4 g/dL (ref 30.0–36.0)
MCV: 86.6 fL (ref 80.0–100.0)
Platelets: 210 10*3/uL (ref 150–400)
RBC: 4.09 MIL/uL (ref 3.87–5.11)
RDW: 13.5 % (ref 11.5–15.5)
WBC: 9.5 10*3/uL (ref 4.0–10.5)
nRBC: 0 % (ref 0.0–0.2)

## 2020-06-10 LAB — RAPID URINE DRUG SCREEN, HOSP PERFORMED
Amphetamines: NOT DETECTED
Barbiturates: NOT DETECTED
Benzodiazepines: NOT DETECTED
Cocaine: NOT DETECTED
Opiates: POSITIVE — AB
Tetrahydrocannabinol: NOT DETECTED

## 2020-06-10 LAB — PROTIME-INR
INR: 1.1 (ref 0.8–1.2)
Prothrombin Time: 13.3 seconds (ref 11.4–15.2)

## 2020-06-10 LAB — APTT: aPTT: 32 seconds (ref 24–36)

## 2020-06-10 SURGERY — POSTERIOR CERVICAL FUSION/FORAMINOTOMY LEVEL 3
Anesthesia: General | Site: Spine Cervical

## 2020-06-10 MED ORDER — SENNOSIDES-DOCUSATE SODIUM 8.6-50 MG PO TABS: 1.0000 | ORAL_TABLET | Freq: Every evening | ORAL | Status: DC | PRN

## 2020-06-10 MED ORDER — BACITRACIN ZINC 500 UNIT/GM EX OINT
TOPICAL_OINTMENT | CUTANEOUS | Status: AC
  Filled 2020-06-10: qty 28.35

## 2020-06-10 MED ORDER — PROPOFOL 500 MG/50ML IV EMUL
INTRAVENOUS | Status: DC | PRN
  Administered 2020-06-10: 50 ug/kg/min via INTRAVENOUS

## 2020-06-10 MED ORDER — SODIUM CHLORIDE 0.9 % IV SOLN: Freq: Once | INTRAVENOUS | Status: DC

## 2020-06-10 MED ORDER — PHENYLEPHRINE HCL-NACL 10-0.9 MG/250ML-% IV SOLN
0.0000 ug/min | INTRAVENOUS | Status: DC
  Administered 2020-06-10: 15 ug/min via INTRAVENOUS
  Administered 2020-06-11: 10 ug/min via INTRAVENOUS
  Filled 2020-06-10: qty 250

## 2020-06-10 MED ORDER — POTASSIUM CHLORIDE 10 MEQ/50ML IV SOLN
10.0000 meq | INTRAVENOUS | Status: AC
  Administered 2020-06-10 – 2020-06-11 (×6): 10 meq via INTRAVENOUS
  Filled 2020-06-10 (×6): qty 50

## 2020-06-10 MED ORDER — ROCURONIUM BROMIDE 10 MG/ML (PF) SYRINGE
PREFILLED_SYRINGE | INTRAVENOUS | Status: AC
  Filled 2020-06-10: qty 10

## 2020-06-10 MED ORDER — PROPOFOL 1000 MG/100ML IV EMUL
5.0000 ug/kg/min | INTRAVENOUS | Status: DC
  Administered 2020-06-10: 30 ug/kg/min via INTRAVENOUS
  Filled 2020-06-10: qty 100

## 2020-06-10 MED ORDER — CHLORHEXIDINE GLUCONATE CLOTH 2 % EX PADS
6.0000 | MEDICATED_PAD | Freq: Every day | CUTANEOUS | Status: DC
  Administered 2020-06-11 – 2020-06-14 (×4): 6 via TOPICAL

## 2020-06-10 MED ORDER — PROPOFOL 1000 MG/100ML IV EMUL
INTRAVENOUS | Status: AC
  Filled 2020-06-10: qty 100

## 2020-06-10 MED ORDER — FENTANYL CITRATE (PF) 100 MCG/2ML IJ SOLN
25.0000 ug | INTRAMUSCULAR | Status: DC | PRN
Start: 2020-06-10 — End: 2020-06-10

## 2020-06-10 MED ORDER — PHENOL 1.4 % MT LIQD
1.0000 | OROMUCOSAL | Status: DC | PRN
  Administered 2020-06-14: 1 via OROMUCOSAL
  Filled 2020-06-10: qty 177

## 2020-06-10 MED ORDER — DEXAMETHASONE SODIUM PHOSPHATE 10 MG/ML IJ SOLN
INTRAMUSCULAR | Status: AC
  Filled 2020-06-10: qty 1

## 2020-06-10 MED ORDER — FENTANYL CITRATE (PF) 250 MCG/5ML IJ SOLN
INTRAMUSCULAR | Status: DC | PRN
  Administered 2020-06-10 (×2): 50 ug via INTRAVENOUS

## 2020-06-10 MED ORDER — LACTATED RINGERS IV SOLN: INTRAVENOUS | Status: DC

## 2020-06-10 MED ORDER — ACETAMINOPHEN 325 MG PO TABS: 650.0000 mg | ORAL_TABLET | ORAL | Status: DC | PRN

## 2020-06-10 MED ORDER — BUPIVACAINE-EPINEPHRINE (PF) 0.25% -1:200000 IJ SOLN
INTRAMUSCULAR | Status: AC
  Filled 2020-06-10: qty 10

## 2020-06-10 MED ORDER — ROCURONIUM 10MG/ML (10ML) SYRINGE FOR MEDFUSION PUMP - OPTIME
INTRAVENOUS | Status: DC | PRN
  Administered 2020-06-10 (×2): 10 mg via INTRAVENOUS
  Administered 2020-06-10: 40 mg via INTRAVENOUS

## 2020-06-10 MED ORDER — CEFAZOLIN SODIUM-DEXTROSE 2-4 GM/100ML-% IV SOLN
2.0000 g | INTRAVENOUS | Status: AC
  Administered 2020-06-10: 2 g via INTRAVENOUS
  Filled 2020-06-10: qty 100

## 2020-06-10 MED ORDER — MENTHOL 3 MG MT LOZG: 1.0000 | LOZENGE | OROMUCOSAL | Status: DC | PRN

## 2020-06-10 MED ORDER — PROPOFOL 10 MG/ML IV BOLUS
INTRAVENOUS | Status: DC | PRN
  Administered 2020-06-10: 50 mg via INTRAVENOUS
  Administered 2020-06-10: 60 mg via INTRAVENOUS

## 2020-06-10 MED ORDER — ONDANSETRON HCL 4 MG PO TABS: 4.0000 mg | ORAL_TABLET | Freq: Four times a day (QID) | ORAL | Status: DC | PRN

## 2020-06-10 MED ORDER — POTASSIUM CHLORIDE 10 MEQ/50ML IV SOLN
10.0000 meq | INTRAVENOUS | Status: DC
  Filled 2020-06-10: qty 50

## 2020-06-10 MED ORDER — KETAMINE HCL 10 MG/ML IJ SOLN
INTRAMUSCULAR | Status: DC | PRN
  Administered 2020-06-10: 15 mg via INTRAVENOUS

## 2020-06-10 MED ORDER — INSULIN ASPART 100 UNIT/ML ~~LOC~~ SOLN
0.0000 [IU] | SUBCUTANEOUS | Status: DC
  Administered 2020-06-10: 5 [IU] via SUBCUTANEOUS
  Administered 2020-06-11: 3 [IU] via SUBCUTANEOUS
  Administered 2020-06-11 (×2): 2 [IU] via SUBCUTANEOUS
  Administered 2020-06-11 (×2): 3 [IU] via SUBCUTANEOUS
  Administered 2020-06-11 – 2020-06-12 (×2): 5 [IU] via SUBCUTANEOUS
  Administered 2020-06-12: 8 [IU] via SUBCUTANEOUS
  Administered 2020-06-12 (×2): 5 [IU] via SUBCUTANEOUS

## 2020-06-10 MED ORDER — FENTANYL 2500MCG IN NS 250ML (10MCG/ML) PREMIX INFUSION
0.0000 ug/h | INTRAVENOUS | Status: DC
  Administered 2020-06-10: 25 ug/h via INTRAVENOUS
  Filled 2020-06-10: qty 250

## 2020-06-10 MED ORDER — VANCOMYCIN HCL 1000 MG IV SOLR
INTRAVENOUS | Status: AC
  Filled 2020-06-10: qty 1000

## 2020-06-10 MED ORDER — THROMBIN (RECOMBINANT) 5000 UNITS EX SOLR
CUTANEOUS | Status: AC
  Filled 2020-06-10: qty 5000

## 2020-06-10 MED ORDER — ONDANSETRON HCL 4 MG/2ML IJ SOLN
INTRAMUSCULAR | Status: DC | PRN
  Administered 2020-06-10: 4 mg via INTRAVENOUS

## 2020-06-10 MED ORDER — LORAZEPAM 2 MG/ML IJ SOLN: 0.5000 mg | Freq: Once | INTRAMUSCULAR | Status: DC | PRN

## 2020-06-10 MED ORDER — LIDOCAINE 2% (20 MG/ML) 5 ML SYRINGE
INTRAMUSCULAR | Status: DC | PRN
  Administered 2020-06-10: 50 mg via INTRAVENOUS

## 2020-06-10 MED ORDER — SODIUM CHLORIDE 0.9 % IV SOLN: 250.0000 mL | INTRAVENOUS | Status: DC

## 2020-06-10 MED ORDER — CEFAZOLIN SODIUM-DEXTROSE 2-4 GM/100ML-% IV SOLN
INTRAVENOUS | Status: AC
  Administered 2020-06-11: 2 g via INTRAVENOUS
  Filled 2020-06-10: qty 100

## 2020-06-10 MED ORDER — SODIUM CHLORIDE 0.9% FLUSH
3.0000 mL | INTRAVENOUS | Status: DC | PRN
Start: 2020-06-10 — End: 2020-06-11

## 2020-06-10 MED ORDER — BUPIVACAINE-EPINEPHRINE (PF) 0.25% -1:200000 IJ SOLN
INTRAMUSCULAR | Status: AC
  Filled 2020-06-10: qty 20

## 2020-06-10 MED ORDER — SODIUM CHLORIDE 0.9% FLUSH
3.0000 mL | Freq: Two times a day (BID) | INTRAVENOUS | Status: DC
  Administered 2020-06-10: 3 mL via INTRAVENOUS

## 2020-06-10 MED ORDER — SODIUM CHLORIDE 0.9 % IV SOLN: INTRAVENOUS | Status: DC

## 2020-06-10 MED ORDER — SUCCINYLCHOLINE CHLORIDE 20 MG/ML IJ SOLN
INTRAMUSCULAR | Status: DC | PRN
  Administered 2020-06-10: 80 mg via INTRAVENOUS

## 2020-06-10 MED ORDER — MORPHINE SULFATE (PF) 2 MG/ML IV SOLN
2.0000 mg | INTRAVENOUS | Status: DC | PRN
  Administered 2020-06-10 (×3): 2 mg via INTRAVENOUS
  Filled 2020-06-10 (×3): qty 1

## 2020-06-10 MED ORDER — PHENYLEPHRINE 40 MCG/ML (10ML) SYRINGE FOR IV PUSH (FOR BLOOD PRESSURE SUPPORT)
PREFILLED_SYRINGE | INTRAVENOUS | Status: AC
  Filled 2020-06-10: qty 10

## 2020-06-10 MED ORDER — METHOCARBAMOL 1000 MG/10ML IJ SOLN: 500.0000 mg | Freq: Four times a day (QID) | INTRAVENOUS | Status: DC | PRN

## 2020-06-10 MED ORDER — SUGAMMADEX SODIUM 200 MG/2ML IV SOLN
INTRAVENOUS | Status: DC | PRN
  Administered 2020-06-10: 200 mg via INTRAVENOUS

## 2020-06-10 MED ORDER — CHLORHEXIDINE GLUCONATE CLOTH 2 % EX PADS: 6.0000 | MEDICATED_PAD | Freq: Every day | CUTANEOUS | Status: DC

## 2020-06-10 MED ORDER — THROMBIN 5000 UNITS EX SOLR
OROMUCOSAL | Status: DC | PRN
  Administered 2020-06-10 (×2): 5 mL via TOPICAL

## 2020-06-10 MED ORDER — HYDROCODONE-ACETAMINOPHEN 7.5-325 MG PO TABS
1.0000 | ORAL_TABLET | ORAL | Status: DC | PRN
  Filled 2020-06-10: qty 1

## 2020-06-10 MED ORDER — SODIUM CHLORIDE 0.9% IV SOLUTION: Freq: Once | INTRAVENOUS | Status: DC

## 2020-06-10 MED ORDER — FENTANYL CITRATE (PF) 250 MCG/5ML IJ SOLN
INTRAMUSCULAR | Status: AC
  Filled 2020-06-10: qty 5

## 2020-06-10 MED ORDER — HYDROCODONE-ACETAMINOPHEN 5-325 MG PO TABS
1.0000 | ORAL_TABLET | Freq: Once | ORAL | Status: AC
  Administered 2020-06-10: 1 via ORAL
  Filled 2020-06-10: qty 1

## 2020-06-10 MED ORDER — LIDOCAINE 2% (20 MG/ML) 5 ML SYRINGE
INTRAMUSCULAR | Status: AC
  Filled 2020-06-10: qty 5

## 2020-06-10 MED ORDER — LIDOCAINE-EPINEPHRINE 1 %-1:100000 IJ SOLN
INTRAMUSCULAR | Status: AC
  Filled 2020-06-10: qty 1

## 2020-06-10 MED ORDER — POTASSIUM CHLORIDE 10 MEQ/100ML IV SOLN
INTRAVENOUS | Status: DC | PRN
  Administered 2020-06-10 (×2): 10 meq via INTRAVENOUS

## 2020-06-10 MED ORDER — KETAMINE HCL 50 MG/5ML IJ SOSY
PREFILLED_SYRINGE | INTRAMUSCULAR | Status: AC
  Filled 2020-06-10: qty 10

## 2020-06-10 MED ORDER — PHENYLEPHRINE HCL (PRESSORS) 10 MG/ML IV SOLN
INTRAVENOUS | Status: DC | PRN
  Administered 2020-06-10: 40 ug via INTRAVENOUS
  Administered 2020-06-10: 80 ug via INTRAVENOUS

## 2020-06-10 MED ORDER — PHENYLEPHRINE HCL-NACL 10-0.9 MG/250ML-% IV SOLN
INTRAVENOUS | Status: DC | PRN
  Administered 2020-06-10: 25 ug/min via INTRAVENOUS
  Administered 2020-06-10: 50 ug/min via INTRAVENOUS
  Administered 2020-06-10: 5 ug/min via INTRAVENOUS

## 2020-06-10 MED ORDER — ALUM & MAG HYDROXIDE-SIMETH 200-200-20 MG/5ML PO SUSP: 30.0000 mL | Freq: Four times a day (QID) | ORAL | Status: DC | PRN

## 2020-06-10 MED ORDER — MEPERIDINE HCL 25 MG/ML IJ SOLN: 6.2500 mg | INTRAMUSCULAR | Status: DC | PRN

## 2020-06-10 MED ORDER — THROMBIN 5000 UNITS EX KIT
PACK | CUTANEOUS | Status: AC
  Filled 2020-06-10: qty 1

## 2020-06-10 MED ORDER — PROPOFOL 10 MG/ML IV BOLUS
INTRAVENOUS | Status: AC
  Filled 2020-06-10: qty 20

## 2020-06-10 MED ORDER — ALBUMIN HUMAN 5 % IV SOLN: INTRAVENOUS | Status: DC | PRN

## 2020-06-10 MED ORDER — SODIUM CHLORIDE 0.9 % IV BOLUS
500.0000 mL | Freq: Once | INTRAVENOUS | Status: AC
  Administered 2020-06-10: 500 mL via INTRAVENOUS

## 2020-06-10 MED ORDER — AMISULPRIDE (ANTIEMETIC) 5 MG/2ML IV SOLN: 10.0000 mg | Freq: Once | INTRAVENOUS | Status: DC | PRN

## 2020-06-10 MED ORDER — GADOBUTROL 1 MMOL/ML IV SOLN
5.0000 mL | Freq: Once | INTRAVENOUS | Status: AC | PRN
  Administered 2020-06-10: 5 mL via INTRAVENOUS

## 2020-06-10 MED ORDER — THROMBIN 20000 UNITS EX SOLR
CUTANEOUS | Status: DC | PRN
  Administered 2020-06-10: 20 mL via TOPICAL

## 2020-06-10 MED ORDER — CEFAZOLIN SODIUM-DEXTROSE 2-4 GM/100ML-% IV SOLN
2.0000 g | Freq: Three times a day (TID) | INTRAVENOUS | Status: AC
  Administered 2020-06-11: 2 g via INTRAVENOUS
  Filled 2020-06-10 (×2): qty 100

## 2020-06-10 MED ORDER — METHOCARBAMOL 500 MG PO TABS: 500.0000 mg | ORAL_TABLET | Freq: Four times a day (QID) | ORAL | Status: DC | PRN

## 2020-06-10 MED ORDER — EPHEDRINE 5 MG/ML INJ
INTRAVENOUS | Status: AC
  Filled 2020-06-10: qty 10

## 2020-06-10 MED ORDER — 0.9 % SODIUM CHLORIDE (POUR BTL) OPTIME
TOPICAL | Status: DC | PRN
  Administered 2020-06-10: 1000 mL

## 2020-06-10 MED ORDER — THROMBIN 20000 UNITS EX SOLR
CUTANEOUS | Status: AC
  Filled 2020-06-10: qty 20000

## 2020-06-10 MED ORDER — ACETAMINOPHEN 650 MG RE SUPP: 650.0000 mg | RECTAL | Status: DC | PRN

## 2020-06-10 MED ORDER — MORPHINE SULFATE (PF) 2 MG/ML IV SOLN
2.0000 mg | INTRAVENOUS | Status: DC | PRN
Start: 2020-06-10 — End: 2020-06-12
  Administered 2020-06-11 (×2): 2 mg via INTRAVENOUS
  Filled 2020-06-10 (×2): qty 1

## 2020-06-10 MED ORDER — EPHEDRINE SULFATE 50 MG/ML IJ SOLN
INTRAMUSCULAR | Status: DC | PRN
  Administered 2020-06-10 (×3): 10 mg via INTRAVENOUS

## 2020-06-10 MED ORDER — MIDAZOLAM HCL 2 MG/2ML IJ SOLN
INTRAMUSCULAR | Status: AC
  Filled 2020-06-10: qty 2

## 2020-06-10 MED ORDER — ONDANSETRON HCL 4 MG/2ML IJ SOLN: 4.0000 mg | Freq: Four times a day (QID) | INTRAMUSCULAR | Status: DC | PRN

## 2020-06-10 SURGICAL SUPPLY — 81 items
ADH SKN CLS APL DERMABOND .7 (GAUZE/BANDAGES/DRESSINGS) ×1
BAND INSRT 18 STRL LF DISP RB (MISCELLANEOUS) ×2
BAND RUBBER #18 3X1/16 STRL (MISCELLANEOUS) ×4 IMPLANT
BIT DRILL NEURO 2X3.1 SFT TUCH (MISCELLANEOUS) ×1 IMPLANT
BNDG GAUZE ELAST 4 BULKY (GAUZE/BANDAGES/DRESSINGS) ×2 IMPLANT
BONE VIVIGEN FORMABLE 5.4CC (Bone Implant) ×2 IMPLANT
BUR MATCHSTICK NEURO 3.0 LAGG (BURR) ×1 IMPLANT
BUR SABER NEURO 2.5 (BURR) IMPLANT
CARTRIDGE OIL MAESTRO DRILL (MISCELLANEOUS) ×1 IMPLANT
CNTNR URN SCR LID CUP LEK RST (MISCELLANEOUS) ×1 IMPLANT
CONT SPEC 4OZ STRL OR WHT (MISCELLANEOUS) ×2
COVER BACK TABLE 60X90IN (DRAPES) ×2 IMPLANT
COVER MAYO STAND STRL (DRAPES) ×2 IMPLANT
COVER WAND RF STERILE (DRAPES) ×2 IMPLANT
DECANTER SPIKE VIAL GLASS SM (MISCELLANEOUS) ×2 IMPLANT
DERMABOND ADVANCED (GAUZE/BANDAGES/DRESSINGS) ×1
DERMABOND ADVANCED .7 DNX12 (GAUZE/BANDAGES/DRESSINGS) ×1 IMPLANT
DIFFUSER DRILL AIR PNEUMATIC (MISCELLANEOUS) ×1 IMPLANT
DRAIN JACKSON RD 7FR 3/32 (WOUND CARE) IMPLANT
DRAPE C-ARM 42X72 X-RAY (DRAPES) ×2 IMPLANT
DRAPE LAPAROTOMY 100X72 PEDS (DRAPES) ×2 IMPLANT
DRAPE MICROSCOPE LEICA (MISCELLANEOUS) ×2 IMPLANT
DRAPE SURG 17X23 STRL (DRAPES) ×2 IMPLANT
DRILL NEURO 2X3.1 SOFT TOUCH (MISCELLANEOUS) ×2
DRSG OPSITE POSTOP 4X6 (GAUZE/BANDAGES/DRESSINGS) ×2 IMPLANT
DURAPREP 26ML APPLICATOR (WOUND CARE) ×2 IMPLANT
ELECT BLADE INSULATED 4IN (ELECTROSURGICAL)
ELECT BLADE INSULATED 6.5IN (ELECTROSURGICAL)
ELECT REM PT RETURN 9FT ADLT (ELECTROSURGICAL)
ELECTRODE BLADE INSULATED 4IN (ELECTROSURGICAL) IMPLANT
ELECTRODE BLDE INSULATED 6.5IN (ELECTROSURGICAL) IMPLANT
ELECTRODE REM PT RTRN 9FT ADLT (ELECTROSURGICAL) ×1 IMPLANT
GAUZE 4X4 16PLY RFD (DISPOSABLE) IMPLANT
GAUZE SPONGE 4X4 12PLY STRL (GAUZE/BANDAGES/DRESSINGS) ×1 IMPLANT
GLOVE ECLIPSE 8.0 STRL XLNG CF (GLOVE) ×2 IMPLANT
GLOVE SRG 8 PF TXTR STRL LF DI (GLOVE) ×1 IMPLANT
GLOVE SURG UNDER POLY LF SZ7 (GLOVE) ×1 IMPLANT
GLOVE SURG UNDER POLY LF SZ7.5 (GLOVE) ×3 IMPLANT
GLOVE SURG UNDER POLY LF SZ8 (GLOVE) ×2
GOWN STRL REUS W/ TWL LRG LVL3 (GOWN DISPOSABLE) IMPLANT
GOWN STRL REUS W/ TWL XL LVL3 (GOWN DISPOSABLE) ×1 IMPLANT
GOWN STRL REUS W/TWL 2XL LVL3 (GOWN DISPOSABLE) IMPLANT
GOWN STRL REUS W/TWL LRG LVL3 (GOWN DISPOSABLE) ×6
GOWN STRL REUS W/TWL XL LVL3 (GOWN DISPOSABLE) ×4
GRAFT BNE MATRIX VG FRMBL MD 5 (Bone Implant) IMPLANT
HEMOSTAT POWDER KIT SURGIFOAM (HEMOSTASIS) ×2 IMPLANT
KIT BASIN OR (CUSTOM PROCEDURE TRAY) ×2 IMPLANT
KIT TURNOVER KIT B (KITS) ×2 IMPLANT
MARKER SKIN DUAL TIP RULER LAB (MISCELLANEOUS) ×2 IMPLANT
MILL MEDIUM DISP (BLADE) ×1 IMPLANT
MODULE EMG NDL SSEP NVM5 (NEEDLE) IMPLANT
MODULE EMG NEEDLE SSEP NVM5 (NEEDLE) ×2 IMPLANT
NDL BLUNT 18X1 FOR OR ONLY (NEEDLE) ×1 IMPLANT
NDL HYPO 25X1 1.5 SAFETY (NEEDLE) ×1 IMPLANT
NDL SPNL 18GX3.5 QUINCKE PK (NEEDLE) ×2 IMPLANT
NEEDLE BLUNT 18X1 FOR OR ONLY (NEEDLE) ×2 IMPLANT
NEEDLE HYPO 25X1 1.5 SAFETY (NEEDLE) ×2 IMPLANT
NEEDLE SPNL 18GX3.5 QUINCKE PK (NEEDLE) ×4 IMPLANT
NS IRRIG 1000ML POUR BTL (IV SOLUTION) ×2 IMPLANT
OIL CARTRIDGE MAESTRO DRILL (MISCELLANEOUS)
PACK LAMINECTOMY NEURO (CUSTOM PROCEDURE TRAY) ×2 IMPLANT
PAD ARMBOARD 7.5X6 YLW CONV (MISCELLANEOUS) ×6 IMPLANT
PATTIES SURGICAL .5 X1 (DISPOSABLE) IMPLANT
ROD TI PREBENT 4.0X45 (Rod) ×2 IMPLANT
SCREW POLY 3.5X12 FOR 4 ROD (Screw) ×6 IMPLANT
SCREW SET SPINAL (Screw) ×6 IMPLANT
SPONGE LAP 4X18 RFD (DISPOSABLE) IMPLANT
SPONGE SURGIFOAM ABS GEL 100 (HEMOSTASIS) ×2 IMPLANT
STAPLER VISISTAT 35W (STAPLE) IMPLANT
SUT VIC AB 0 CT1 18XCR BRD8 (SUTURE) IMPLANT
SUT VIC AB 0 CT1 27 (SUTURE) ×2
SUT VIC AB 0 CT1 27XBRD ANBCTR (SUTURE) ×1 IMPLANT
SUT VIC AB 0 CT1 8-18 (SUTURE) ×4
SUT VIC AB 2-0 CP2 18 (SUTURE) ×2 IMPLANT
SYR 3ML LL SCALE MARK (SYRINGE) ×2 IMPLANT
SYR CONTROL 10ML LL (SYRINGE) ×1 IMPLANT
TOWEL GREEN STERILE (TOWEL DISPOSABLE) ×2 IMPLANT
TOWEL GREEN STERILE FF (TOWEL DISPOSABLE) ×2 IMPLANT
TRAY FOLEY MTR SLVR 16FR STAT (SET/KITS/TRAYS/PACK) ×2 IMPLANT
TUBE FEEDING ENTERAL 5FR 16IN (TUBING) ×1 IMPLANT
WATER STERILE IRR 1000ML POUR (IV SOLUTION) ×2 IMPLANT

## 2020-06-10 NOTE — ED Provider Notes (Signed)
[facility] EMERGENCY DEPARTMENT Provider Note   CSN: [id number] Arrival date & time: 06/10/20  0101     History Chief Complaint  Patient presents with  . Weakness    Suzanne Fox is a 87 y.o. female.  The history is provided by the patient, a relative and a caregiver.  Weakness Severity:  Moderate Onset quality:  Gradual Duration:  1 day Timing:  Constant Progression:  Unchanged Chronicity:  New Relieved by:  None tried Exacerbated by: walking. Associated symptoms: arthralgias   Associated symptoms: no abdominal pain, no chest pain, no fever and no headaches   Patient with history of diabetes, hypertension, hyperlipidemia, peripheral vascular disease, dementia presents with generalized weakness and joint pain. Patient lives at home with her twin sister and her niece lives next door and cares for her frequently. Patient woke up reporting right shoulder pain.  No recent falls or trauma.  She saw her PCP who gave her a cortisone injection and anti-inflammatory injection in her buttock The shoulder was not injected.  Since that time her right shoulder pain has worsened.  She now has difficulty walking and moving her legs  she reports pain in her right knee She typically can ambulate with a cane No falls are reported. No fevers or vomiting.  No chest pain or abdominal pain    Past Medical History:  Diagnosis Date  . Arthritis   . Diabetes mellitus   . Heart murmur   . Hiatal hernia   . Hyperlipidemia   . Hypertension   . Peripheral vascular disease [facility])     Patient Active Problem List   Diagnosis Date Noted  . Pain due to onychomycosis of toenails of both feet 09/05/2019  . 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 ABDOMINAL HYSTERECTOMY    . CHOLECYSTECTOMY       OB History   No obstetric history on file.     Family History  Problem Relation Age of Onset  . Other Mother         At childbirth  . Prostate cancer Father   . Cancer Maternal Aunt   . Asthma Sister   . Diabetes Sister   . Diabetes Sister   . Diabetes Sister     Social History   Tobacco Use  . Smoking status: Former Smoker    Packs/day: 1.00    Years: 50.00    Pack years: 50.00    Types: Cigarettes    Quit date: 08/27/2004    Years since quitting: 15.7  . Smokeless tobacco: Never Used  Substance Use Topics  . Alcohol use: No  . Drug use: No    Home Medications Prior to Admission medications   Medication Sig Start Date End Date Taking? Authorizing Provider  amLODipine (NORVASC) 10 MG tablet Take 10 mg by mouth daily.    [provider]  aspirin EC 81 MG tablet Take 81 mg by mouth every other day.    [provider]  atenolol-chlorthalidone (TENORETIC) 50-25 MG per tablet Take 1 tablet by mouth daily.    [provider]  atorvastatin (LIPITOR) 40 MG tablet Take 40 mg by mouth daily.    [provider]  augmented betamethasone dipropionate (DIPROLENE-AF) 0.05 % ointment Apply topically. 10/27/19   [provider]  cilostazol (PLETAL) 50 MG tablet Take 50 mg by mouth 2 (two) times daily.     [provider]  clobetasol cream (  TEMOVATE) 0.05 % Apply 1 application topically 2 (two) times daily.     [provider]  cloNIDine (CATAPRES) 0.1 MG tablet Take 0.1 mg by mouth 2 (two) times daily. 09/29/19   [provider]  glipiZIDE (GLUCOTROL XL) 2.5 MG 24 hr tablet Take 2.5 mg by mouth every morning. 10/29/19   [provider]  guanFACINE (TENEX) 1 MG tablet Take 2 mg by mouth at bedtime.    [provider]  levothyroxine (SYNTHROID, LEVOTHROID) 25 MCG tablet Take 25 mcg by mouth daily.     [provider]  lisinopril (PRINIVIL,ZESTRIL) 40 MG tablet Take 80 mg by mouth daily.     [provider]  lisinopril (ZESTRIL) 40 MG tablet SMARTSIG:2 Tablet(s) By Mouth Every Morning 09/04/19   [provider]  memantine (NAMENDA) 10 MG tablet Take 10 mg by mouth every morning. 12/09/19   [provider]  metFORMIN (GLUCOPHAGE-XR) 500 MG 24 hr tablet Take 1,000 mg by mouth 2 (two) times daily.     [provider]    Allergies    Other  Review of Systems   Review of Systems  Constitutional: Negative for fever.  Cardiovascular: Negative for chest pain.  Gastrointestinal: Negative for abdominal pain.  Musculoskeletal: Positive for arthralgias and joint swelling.  Neurological: Positive for weakness. Negative for headaches.  All other systems reviewed and are negative.   Physical Exam Updated Vital Signs BP (!) 146/63   Pulse 65   Temp 97.8 F (36.6 C) (Oral)   Resp (!) 25   Ht 1.524 m (5')   Wt 59.9 kg   SpO2 100%   BMI 25.79 kg/m   Physical Exam CONSTITUTIONAL: Elderly and frail HEAD: Normocephalic/atraumatic EYES: EOMI/PERRL ENMT: Mucous membranes moist NECK: supple no meningeal signs SPINE/BACK:entire spine nontender CV: S1/S2 noted, no murmurs/rubs/gallops noted LUNGS: Lungs are clear to auscultation bilaterally, no apparent distress ABDOMEN: soft, nontender, no rebound or guarding, bowel sounds noted throughout abdomen GU:no cva tenderness NEURO: Pt is awake/alert/appropriate, no facial droop.  She is able move left arm without difficulty.  She has limitation in range of motion of her right arm and right leg due to joint pain EXTREMITIES: Soft tissue swelling and tenderness noted to the right shoulder.  No erythema is noted.  No crepitus.  See photo below. Deformity noted to the right knee.  No effusion is noted. Distal pulses are intact in all 4 extremities.  Pulses in both feet found by Doppler SKIN: warm, color normal PSYCH: no abnormalities of mood noted, alert and oriented to situation  Patient gave verbal permission to utilize photo for medical documentation only The image was not stored on any personal device        ED  Results / Procedures / Treatments   Labs (all labs ordered are listed, but only abnormal results are displayed) Labs Reviewed  BASIC METABOLIC PANEL - Abnormal; Notable for the following components:      Result Value   Sodium 128 (*)    Chloride 93 (*)    CO2 19 (*)    Glucose, Bld 184 (*)    Anion gap 16 (*)    All other components within normal limits  CBC - Abnormal; Notable for the following components:   Hemoglobin 11.1 (*)    HCT 35.4 (*)    All other components within normal limits  RESP PANEL BY RT-PCR (FLU A&B, COVID) [id number]  URINALYSIS, ROUTINE W REFLEX MICROSCOPIC  RAPID URINE DRUG SCREEN, HOSP  PERFORMED    EKG EKG Interpretation  Date/Time:  Thursday June 10 2020 01:20:59 EDT Ventricular Rate:  63 PR Interval:  214 QRS Duration: 68 QT Interval:  454 QTC Calculation: 464 R Axis:   49 Text Interpretation: Sinus rhythm with 1st degree A-V block Otherwise normal ECG No previous ECGs available Interpretation limited secondary to artifact Confirmed by Zadie RhineWickline, Shanicka Oldenkamp ([id number]) on 06/10/2020 3:04:11 AM   Radiology DG Pelvis Portable  Result Date: 06/10/2020 CLINICAL DATA:  Initial evaluation for acute pain status post fall. EXAM: PORTABLE PELVIS 1-2 VIEWS COMPARISON:  None. FINDINGS: Right hip in internal rotation, somewhat limiting assessment. There is suspected cortical irregularity with possible impaction at the subcapital right femoral neck, which could reflect an acute fracture. Femoral head remains in normal alignment within the acetabulum. Femoral head height maintained. No acute abnormality about the left hip. Bony pelvis intact. SI joints approximated. No pubic diastasis. Advanced degenerative spondylosis noted within the lower lumbar spine. Osteopenia. No visible soft tissue injury. IMPRESSION: 1. Question cortical irregularity with possible impaction at the subcapital right femoral neck, suspicious for possible fracture. Correlation with physical exam recommended.  Additionally, further assessment with dedicated radiographs of the right hip could be performed as clinically warranted. 2. No other acute osseous abnormality about the pelvis. Electronically Signed   By: Rise MuBenjamin  McClintock M.D.   On: 06/10/2020 03:59   DG Chest Port 1 View  Result Date: 06/10/2020 CLINICAL DATA:  Initial evaluation for acute trauma, fall. EXAM: PORTABLE CHEST 1 VIEW COMPARISON:  None available. FINDINGS: Transverse heart size within normal limits. Mediastinal silhouette normal. Aortic atherosclerosis. Lungs normally inflated. No focal infiltrates. No pulmonary edema or pleural effusion. Minimal left basilar subsegmental atelectasis and/or scarring. No visible pneumothorax. No acute osseous finding. Prominent osteoarthritic changes noted about the right shoulder. Underlying osteopenia. IMPRESSION: 1. Minimal left basilar subsegmental atelectasis and/or scarring. 2. No other active cardiopulmonary disease. 3.  Aortic Atherosclerosis (ICD10-I70.0). Electronically Signed   By: Rise MuBenjamin  McClintock M.D.   On: 06/10/2020 04:40   DG Shoulder Right Port  Result Date: 06/10/2020 CLINICAL DATA:  Initial evaluation for acute pain status post fall. EXAM: PORTABLE RIGHT SHOULDER COMPARISON:  None available. FINDINGS: No visible acute fracture or dislocation. Advanced osteoarthritic changes seen about the right glenohumeral articulation, with more mild to moderate changes about the right acromioclavicular joint. No visible soft tissue abnormality. Underlying osteopenia. Visualized right hemithorax clear. IMPRESSION: 1. No acute osseous abnormality about the right shoulder. 2. Advanced osteoarthritic changes about the right glenohumeral articulation. 3. Osteopenia. Electronically Signed   By: Rise MuBenjamin  McClintock M.D.   On: 06/10/2020 03:51   DG Knee Right Port  Result Date: 06/10/2020 CLINICAL DATA:  Initial evaluation for acute trauma, fall. EXAM: PORTABLE RIGHT KNEE - 1-2 VIEW COMPARISON:  None.  FINDINGS: No acute fracture dislocation. Moderate osteoarthritic changes present about the knee. No joint effusion. No visible soft tissue injury. Osteopenia noted. Prominent vascular calcifications noted at the posterior right thigh/knee. IMPRESSION: 1. No acute osseous abnormality about the right knee. 2. Moderate degenerative osteoarthrosis. Electronically Signed   By: Rise MuBenjamin  McClintock M.D.   On: 06/10/2020 04:42   DG Hip Unilat W or Wo Pelvis 2-3 Views Right  Result Date: 06/10/2020 CLINICAL DATA:  Initial evaluation for acute trauma, fall. EXAM: DG HIP (WITH OR WITHOUT PELVIS) 2-3V RIGHT COMPARISON:  Prior radiograph of the pelvis from earlier the same day. FINDINGS: Additional views of the right hip demonstrate no acute fracture or dislocation. Femoral head in normal alignment  within the acetabulum. Femoral head height maintained. Visualized bony pelvis intact. Mild osteoarthritic changes noted about the hips. No visible soft tissue injury. Advanced degenerative spondylosis again noted within the lower lumbar spine. Vascular calcifications overlying the pelvis and thigh noted. IMPRESSION: 1. No acute fracture or dislocation. 2. Previously questioned cortical irregularity is not seen on these additional views of the right hip. This was likely positional on prior exam. Electronically Signed   By: Rise Mu M.D.   On: 06/10/2020 04:45    Procedures Procedures   Medications Ordered in ED Medications  HYDROcodone-acetaminophen (NORCO/VICODIN) 5-325 MG per tablet 1 tablet (1 tablet Oral Given 06/10/20 0407)    ED Course  I have reviewed the triage vital signs and the nursing notes.  Pertinent labs & imaging results that were available during my care of the patient were reviewed by me and considered in my medical decision making (see chart for details).    MDM Rules/Calculators/A&P                          Patient presented for multiple complaints.  Patient had woken up with  right shoulder pain and was seen by her PCP.  After receiving injections for pain and steroids, she reported weakness throughout her body.  The knees also felt that her right knee appeared deformed.  X-ray imaging was performed, there is no acute fracture or dislocation is noted.  The right shoulder is mildly tender to palpation but there is no erythema or warmth.  Low suspicion for septic joint at this time. 5:49 AM On reassessment, patient has almost no movement in the right leg.  She also has significantly decreased grip in the right hand which would not be explained by pain in the shoulder. She has no focal weakness in the left arm or leg.  No obvious facial droop.  Due to dementia patient is a poor historian.  At this point  concern for possible stroke.  MRI has been ordered Last known well is unknown. tPA in stroke considered but not given due to: Onset over 3-4.5hours  7:16 AM Signed out to Dr. Clarice Pole at shift change to follow-up on MRI.  Patient will need to be admitted.  If MRI brain negative, would advise exploration of myelopathy as cause of weakness.  Final Clinical Impression(s) / ED Diagnoses Final diagnoses:  Weakness  Pain in joint of right shoulder    Rx / DC Orders ED Discharge Orders    None       Zadie Rhine, MD 06/10/20 [phone]

## 2020-06-10 NOTE — Anesthesia Procedure Notes (Signed)
Procedure Name: Intubation Date/Time: 06/10/2020 5:37 PM Performed by: Gwenyth Allegra, CRNA Pre-anesthesia Checklist: Patient identified, Emergency Drugs available, Suction available, Patient being monitored and Timeout performed Patient Re-evaluated:Patient Re-evaluated prior to induction Oxygen Delivery Method: Circle system utilized Preoxygenation: Pre-oxygenation with 100% oxygen Induction Type: Rapid sequence Laryngoscope Size: Glidescope Grade View: Grade I Tube type: Oral Tube size: 7.0 mm Number of attempts: 1 Airway Equipment and Method: Stylet and Video-laryngoscopy Placement Confirmation: ETT inserted through vocal cords under direct vision,  positive ETCO2 and breath sounds checked- equal and bilateral Secured at: 21 cm Tube secured with: Tape Dental Injury: Teeth and Oropharynx as per pre-operative assessment

## 2020-06-10 NOTE — Progress Notes (Addendum)
I discussed the findings of the MRI and CAT scan with the patient.  Discussed how I recommend surgical intervention for any improvement of her right upper greater than lower extremity weakness.  She agrees to proceed with surgical intervention at this time.  We discussed all risks and benefits which included but were not limited to heart attack, stroke, death, infection, further bleeding, further spinal cord injury, hardware failure.  She understands that surgical intervention is her only opportunity for any improvement neurologically.  At this time she is still awake and alert, communicates appropriately.  She states she does take aspirin daily but is unsure of the dose.  Per her niece, she did get an injection of Toradol yesterday from her primary care physician.  On physical exam she is 0/5 in the right upper extremity in all groups, the right lower extremity has no spontaneous movement at 0/5 however now she will withdraw to pain in the right lower extremity.  The left arm and left leg remain 4+/5 in strength.  I am also going to give her platelet pheresis given her aspirin and Toradol.  Monia Pouch, DO Neurosurgeon

## 2020-06-10 NOTE — ED Notes (Signed)
Patient taken to MRI

## 2020-06-10 NOTE — Anesthesia Preprocedure Evaluation (Addendum)
Anesthesia Evaluation  Patient identified by MRN, date of birth, ID band Patient awake    Reviewed: Allergy & Precautions, NPO status , Patient's Chart, lab work & pertinent test results  Airway Mallampati: II  TM Distance: >3 FB Neck ROM: Full    Dental  (+) Dental Advisory Given, Edentulous Upper, Edentulous Lower   Pulmonary neg pulmonary ROS, former smoker,    Pulmonary exam normal breath sounds clear to auscultation       Cardiovascular hypertension, Pt. on medications + Peripheral Vascular Disease  Normal cardiovascular exam+ Valvular Problems/Murmurs  Rhythm:Regular Rate:Normal     Neuro/Psych negative neurological ROS     GI/Hepatic Neg liver ROS, hiatal hernia,   Endo/Other  negative endocrine ROSdiabetes  Renal/GU negative Renal ROS     Musculoskeletal negative musculoskeletal ROS (+)   Abdominal   Peds  Hematology negative hematology ROS (+)   Anesthesia Other Findings   Reproductive/Obstetrics                           Anesthesia Physical Anesthesia Plan  ASA: III  Anesthesia Plan: General   Post-op Pain Management:    Induction: Intravenous, Rapid sequence and Cricoid pressure planned  PONV Risk Score and Plan: 4 or greater and Ondansetron and Dexamethasone  Airway Management Planned: Oral ETT and Video Laryngoscope Planned  Additional Equipment: Arterial line  Intra-op Plan:   Post-operative Plan: Possible Post-op intubation/ventilation  Informed Consent: I have reviewed the patients History and Physical, chart, labs and discussed the procedure including the risks, benefits and alternatives for the proposed anesthesia with the patient or authorized representative who has indicated his/her understanding and acceptance.     Dental advisory given  Plan Discussed with: CRNA  Anesthesia Plan Comments:        Anesthesia Quick Evaluation

## 2020-06-10 NOTE — ED Provider Notes (Addendum)
Follow-up MRI.  Anticipate admission. Physical Exam  BP (!) 145/52   Pulse (!) 55   Temp 97.8 F (36.6 C) (Oral)   Resp 18   Ht 5' (1.524 m)   Wt 59.9 kg   SpO2 100%   BMI 25.79 kg/m   Physical Exam Patient is alert and interactive.  She is cooperative.  She follows commands to the best of her capacity based on pain and dysfunction of the right shoulder and upper extremity.  Right shoulder has pronounced 10 cm protuberant swelling anteriorly.  Not significantly erythematous.  She does have pain with range of motion of the shoulder.  There is crepitus present.  Hand is warm and dry with normal pulse.  Right knee has some mild erythema and large arthritic changes.  No gross effusion. ED Course/Procedures     Procedures  MDM  9: 14 reviewed with Dale Thayer of orthopedics for right shoulder mass with pain and limited range of motion.  Will consult and likely proceed with MRI.  10: 06 placed repeat request for consult for neurosurgery.  10: 25 reviewed with Dr. Jake Samples neurosurgery.  He will add MRI C-spine and consult.  11: 07 consult Dr. Ophelia Charter [facility] hospitalist for admission Arby Barrette, MD 06/10/20 1006    Arby Barrette, MD 06/10/20 1109

## 2020-06-10 NOTE — ED Notes (Signed)
Patient transported to MRI 

## 2020-06-10 NOTE — Progress Notes (Signed)
I discussed with the patient's niece, Boone Master at [phone], she described to me that yesterday the patient was complaining of right shoulder pain and therefore made an appointment with her primary care physician, Dr. Nicholos Johns.  She was taken there and given a steroid and anti-inflammatory intramuscular injection.  She was brought back home by her family in which they had difficulty helping her even stand independently.  She needed help with her right leg at that point and could hardly move it whatsoever.  She stated she did not quite feel like herself.  Then this morning she continued to complain of right shoulder pain therefore they brought her to the emergency department.  I discussed with her at length the findings of the MRI brain and the likelihood of severe cervical stenosis, we did discuss that she is a poor candidate for surgery given her age despite the findings and her poor physical exam.  She mentions that she is coherent and can make her own decisions and that if she states she does not want any surgical intervention then she agrees.  I discussed with her that it is unlikely that she will need surgical intervention due to her age. I told her I would call back once MRI is completed, she is going to discuss with her other family to see if she has any type of advanced directive.  I answered all of her questions.    Warden Buffa, DO

## 2020-06-10 NOTE — ED Notes (Signed)
Niece Cherrie called to talk to Pt. I informed Nurse that Her Niece would like and update.

## 2020-06-10 NOTE — Anesthesia Postprocedure Evaluation (Signed)
Anesthesia Post Note  Patient: Suzanne Fox  Procedure(s) Performed: POSTERIOR CERVICAL FUSION/FORAMINOTOMY LEVEL cervical three-cervical six (N/A Spine Cervical)     Patient location during evaluation: PACU Anesthesia Type: General Level of consciousness: sedated Pain management: pain level controlled Vital Signs Assessment: post-procedure vital signs reviewed and stable Respiratory status: spontaneous breathing, nonlabored ventilation, patient on ventilator - see flowsheet for VS and patient remains intubated per anesthesia plan Cardiovascular status: blood pressure returned to baseline and stable Postop Assessment: no apparent nausea or vomiting Anesthetic complications: no Comments: Pt cold at the end of the procedure despite forced warming throughout the case. Bear Hugger and warm blankets continued in the PACU. Pt was not extubated to to her temperature. Will be sent to the ICU and extubated when her temperature improves.    No complications documented.  Last Vitals:  Vitals:   06/10/20 2210 06/10/20 2215  BP:    Pulse: (!) 55 (!) 59  Resp: 18 19  Temp: (!) 33.6 C (!) 33.8 C  SpO2: 100% 100%    Last Pain:  Vitals:   06/10/20 1600  TempSrc: Oral  PainSc:                  Eleaner Dibartolo,W. EDMOND

## 2020-06-10 NOTE — Progress Notes (Signed)
Received on cart from ED.  Assisted to the bed and 10 minutes later, surgery called to get report and said pt is going to surgery for a C3-C6 decompression.  Was here long enough to get vitals and assister her with peri care, as she was incont.  Called report to [facility] in short stay and pt is now on her way to surgery.

## 2020-06-10 NOTE — ED Notes (Signed)
Heat Pack was placed on patient, Right Shoulder.

## 2020-06-10 NOTE — Consult Note (Signed)
NAME:  Suzanne Fox, MRN:  [id number], DOB:  [birth year], LOS: 0 ADMISSION DATE:  06/10/2020, CONSULTATION DATE: 06/10/2020 REFERRING MD:  Bethann Goo, DO, CHIEF COMPLAINT: Right-sided weakness  History of Present Illness:  Patient is intubated and sedated so most of the history taken from chart review  87 year old female with peripheral vascular disease, hypertension, hyperlipidemia and diabetes who presented with increasing right shoulder pain, she went to see primary care physician where she had a steroid and Toradol injection, today she presented with increasing right-sided weakness, MRI of cervical spine showed spontaneous epidural hematoma with cord compression and severe cervical spinal stenosis.  Neurosurgery was consulted, patient underwent emergent C3-C6 decompression and fusion, postop she was hypothermic so decision was made to keep her intubated and sedated until her temperature is back to normal.  Pertinent  Medical History   Past Medical History:  Diagnosis Date  . Arthritis   . Dementia without behavioral disturbance (HCC) 06/10/2020  . Diabetes mellitus   . Heart murmur   . Hiatal hernia   . Hyperlipidemia   . Hypertension   . Hypothyroidism (acquired) 06/10/2020  . Peripheral vascular disease (HCC)     Significant Hospital Events: Including procedures, antibiotic start and stop dates in addition to other pertinent events   . 3/17 underwent emergent C3-6 decompression and fusion of cervical spine  Interim History / Subjective:  Remains intubated and sedated  Objective   Blood pressure (!) 148/53, pulse 61, temperature (!) 93.2 F (34 C), resp. rate (!) 22, height 5' (1.524 m), weight 59.9 kg, SpO2 100 %.    Vent Mode: PRVC FiO2 (%):  [100 %] 100 % Set Rate:  [18 bmp] 18 bmp Vt Set:  [400 mL] 400 mL PEEP:  [5 cmH20] 5 cmH20 Plateau Pressure:  [12 cmH20] 12 cmH20   Intake/Output Summary (Last 24 hours) at 06/10/2020 2227 Last data filed at 06/10/2020  2101 Gross per 24 hour  Intake 2889 ml  Output 2725 ml  Net 164 ml   Filed Weights   06/10/20 0306  Weight: 59.9 kg    Examination: Physical exam: General: Chronically ill-appearing female, orally intubated HEENT: /AT, eyes anicteric.  ETT in place Neuro: Sedated, not following commands.  Opens eyes with vocal stimuli, pupils 3 mm bilateral reactive to light, withdraws in lower extremities, right upper extremity no withdrawal to pain Chest: Coarse breath sounds, no wheezes or rhonchi Heart: Regular rate and rhythm, no murmurs or gallops Abdomen: Soft, nontender, nondistended, bowel sounds present Skin: No rash  Labs/imaging personally reviewed   3/17 MRI cervical spine: Ventral epidural collection or structure is likely due to hematoma but could represent ossification of the posterior longitudinal ligament. Hematoma is favored signal intensity is similar to that seen about the cord at the C2 level. CT of the cervical spine could be used to assess for ossification of the posterior longitudinal ligament.  Severe central canal stenosis at C2-3, C3-4, C4-5 and C5-6. There is edema within the cord from approximately the foramen magnum through the C6 level. The extent of edema is worrisome for acute Abnormality  3/17 MRI right shoulder: Severe glenohumeral osteoarthritis.  Massive volume of subacromial/subdeltoid fluid consistent with bursitis.  Complete tear of the long head of biceps from the superior labrum.  Rotator cuff tendinopathy without tear. Mild atrophy of musculature about the shoulder is likely due to disuse.  Moderate acromioclavicular osteoarthritis.  3/17 CT cervical spine: 1. Ventral epidural abnormality seen on same day MRI is hyperdense on  CT and does not correlate with bone, compatible with acute/recent hemorrhage. Additional canal hemorrhage is noted more superiorly at the foramen magnum/craniocervical junction.   Resolved Hospital Problem list      Assessment & Plan:  Right-sided weakness due to severe cervical spinal stenosis Spontaneous cervical epidural hematoma with cord compression s/p C3-C6 decompression and fusion Acute hypoxic respiratory failure Hyponatremia Hypokalemia Diabetes type 2  Patient underwent urgent neurosurgical decompression of her cervical spine due to cord compression Currently remain intubated and sedated Continue lung protective ventilation Vent bundle ordered Elevate head end of the bed Minimize sedation with RASS goal -1/-2 SAT/SBT in a.m. Monitor serum sodium Continue supplement electrolytes and monitor Hold oral hypoglycemic agent, continue sliding scale insulin Monitor fingerstick with goal 140-180  Best practice (evaluated daily)  Diet:  NPO Pain/Anxiety/Delirium protocol (if indicated): Yes (RASS goal -1) VAP protocol (if indicated): Yes DVT prophylaxis: SCD GI prophylaxis: H2B Glucose control:  SSI Yes Central venous access:  N/A Arterial line:  N/A Foley:  N/A Mobility:  bed rest  PT consulted: N/A Last date of multidisciplinary goals of care discussion(per primary team] Code Status:  full code Disposition: ICU  Labs   CBC: Recent Labs  Lab 06/10/20 0114 06/10/20 1954 06/10/20 2120  WBC 9.5  --   --   HGB 11.1* 9.9* 9.2*  HCT 35.4* 29.0* 27.0*  MCV 86.6  --   --   PLT 210  --   --     Basic Metabolic Panel: Recent Labs  Lab 06/10/20 0114 06/10/20 1954 06/10/20 2120  NA 128* 130* 132*  K 3.6 2.6* 2.5*  CL 93* 89*  --   CO2 19*  --   --   GLUCOSE 184* 205*  --   BUN 15 8  --   CREATININE 0.72 0.30*  --   CALCIUM 9.8  --   --    GFR: Estimated Creatinine Clearance: 39.4 mL/min (A) (by C-G formula based on SCr of 0.3 mg/dL (L)). Recent Labs  Lab 06/10/20 0114  WBC 9.5    Liver Function Tests: No results for input(s): AST, ALT, ALKPHOS, BILITOT, PROT, ALBUMIN in the last 168 hours. No results for input(s): LIPASE, AMYLASE in the last 168 hours. No  results for input(s): AMMONIA in the last 168 hours.  ABG    Component Value Date/Time   PHART 7.452 (H) 06/10/2020 2120   PCO2ART 35.8 06/10/2020 2120   PO2ART 305 (H) 06/10/2020 2120   HCO3 26.1 06/10/2020 2120   TCO2 27 06/10/2020 2120   O2SAT 100.0 06/10/2020 2120     Coagulation Profile: Recent Labs  Lab 06/10/20 1631  INR 1.1    Cardiac Enzymes: No results for input(s): CKTOTAL, CKMB, CKMBINDEX, TROPONINI in the last 168 hours.  HbA1C: No results found for: HGBA1C  CBG: Recent Labs  Lab 06/10/20 2203  GLUCAP 186*    Review of Systems:   Unable to obtain as patient intubated and sedated  Past Medical History:  She,  has a past medical history of Arthritis, Dementia without behavioral disturbance (HCC) (06/10/2020), Diabetes mellitus, Heart murmur, Hiatal hernia, Hyperlipidemia, Hypertension, Hypothyroidism (acquired) (06/10/2020), and Peripheral vascular disease (HCC).   Surgical History:   Past Surgical History:  Procedure Laterality Date  . ABDOMINAL HYSTERECTOMY    . CHOLECYSTECTOMY       Social History:   reports that she quit smoking about 15 years ago. Her smoking use included cigarettes. She has a 50.00 pack-year smoking history. She has never used  smokeless tobacco. She reports that she does not drink alcohol and does not use drugs.   Family History:  Her family history includes Asthma in her sister; Cancer in her maternal aunt; Diabetes in her sister, sister, and sister; Other in her mother; Prostate cancer in her father.   Allergies Allergies  Allergen Reactions  . Aricept [Donepezil]     Other reaction(s): localised rash  . Other Other (See Comments)     Home Medications  Prior to Admission medications   Medication Sig Start Date End Date Taking? Authorizing Provider  amLODipine (NORVASC) 10 MG tablet Take 10 mg by mouth daily.   Yes [provider]  aspirin EC 81 MG tablet Take 81 mg by mouth daily.   Yes [provider]  atenolol-chlorthalidone (TENORETIC) 50-25 MG per tablet Take 1 tablet by mouth daily.   Yes [provider]  atorvastatin (LIPITOR) 40 MG tablet Take 40 mg by mouth daily.   Yes [provider]  augmented betamethasone dipropionate (DIPROLENE-AF) 0.05 % ointment Apply 1 application topically daily as needed (rash). 10/27/19  Yes [provider]  cloNIDine (CATAPRES) 0.1 MG tablet Take 0.1 mg by mouth 2 (two) times daily. 09/29/19  Yes [provider]  levothyroxine (SYNTHROID, LEVOTHROID) 25 MCG tablet Take 25 mcg by mouth daily.    Yes [provider]  lisinopril (PRINIVIL,ZESTRIL) 40 MG tablet Take 80 mg by mouth daily.    Yes [provider]  memantine (NAMENDA) 10 MG tablet Take 10 mg by mouth every morning. 12/09/19  Yes [provider]     Total critical care time: 35 minutes  Performed by: Cheri Fowler   Critical care time was exclusive of separately billable procedures and treating other patients.   Critical care was necessary to treat or prevent imminent or life-threatening deterioration.   Critical care was time spent personally by me on the following activities: development of treatment plan with patient and/or surrogate as well as nursing, discussions with consultants, evaluation of patient's response to treatment, examination of patient, obtaining history from patient or surrogate, ordering and performing treatments and interventions, ordering and review of laboratory studies, ordering and review of radiographic studies, pulse oximetry and re-evaluation of patient's condition.   Cheri Fowler MD [facility] See Amion for pager If no response to pager, please call [phone] until 7pm After 7pm, Please call E-link [phone]

## 2020-06-10 NOTE — Transfer of Care (Signed)
Immediate Anesthesia Transfer of Care Note  Patient: Suzanne Fox  Procedure(s) Performed: POSTERIOR CERVICAL FUSION/FORAMINOTOMY LEVEL cervical three-cervical six (N/A Spine Cervical)  Patient Location: PACU  Anesthesia Type:General  Level of Consciousness: Patient remains intubated per anesthesia plan  Airway & Oxygen Therapy: Patient remains intubated per anesthesia plan and Patient placed on Ventilator (see vital sign flow sheet for setting)  Post-op Assessment: Report given to RN and Post -op Vital signs reviewed and stable  Post vital signs: Reviewed and stable  Last Vitals:  Vitals Value Taken Time  BP 141/42 (ABP)   Temp 32.6 C 06/10/20 2139  Pulse 56 06/10/20 2139  Resp 17 06/10/20 2139  SpO2 100 % 06/10/20 2139  Vitals shown include unvalidated device data.  Last Pain:  Vitals:   06/10/20 1600  TempSrc: Oral  PainSc:      Report to Harriett Sine RN in PACU, 2 Bare Huggers applied, upper and lower @ 43C in PACU. Unable to extubate due to low body temperatures, propofol, neo, potassium running per orders, RT at bedside to apply to ventilator, transfer of patient in safe and stable condition.     Complications: No complications documented.

## 2020-06-10 NOTE — Consult Note (Signed)
   Providing Compassionate, Quality Care - Together  Neurosurgery Consult  Referring physician: Dr. Clarice Pole Reason for referral: Weakness, cervical stenosis  Chief Complaint: Right-sided weakness  History of Present Illness: This is an 87 year old female with a history of diabetes hypertension hyperlipidemia, dementia that presents with acute right-sided weakness in her arm and leg.  She states she woke up with weakness and also has acute right shoulder pain.  She denies any trauma whatsoever.  She states she is normally ambulatory with an assistive device at home.  She lives with her twin.  She has for the most part self-sufficient.  She denies any numbness at this time.  She denies any bowel or bladder changes.  She denies any left-sided symptoms.  She was initially worked up for stroke, MRI of the brain was negative for stroke.  On [facility] there was noted moderate to severe stenosis in the upper cervical spine therefore I was consulted.  She is somewhat of a poor historian.  She also complains of pain in her right knee.   Medications: I have reviewed the patient's current medications. Allergies: No Known Allergies  History reviewed. No pertinent family history. Social History:  has no history on file for tobacco use, alcohol use, and drug use.  ROS: Unable to obtain  Physical Exam:  Vital signs in last 24 hours: Temp:  [98 F (36.7 C)-98.3 F (36.8 C)] 98 F (36.7 C) (07/25 1814) Pulse Rate:  [58-128] 65 (07/26 0746) Resp:  [11-18] 14 (07/26 0217) BP: (138-182)/(65-125) 153/88 (07/26 0700) SpO2:  [91 %-98 %] 96 % (07/26 0746) PE: Awake, alert, oriented to person PERRLA face symmetric EOMI Left upper/lower extremity 4+/5 strength Right upper extremity 1/5 strength throughout, tender to palpation in the right shoulder Right lower extremity 0/5 strength throughout except able to wiggle toes slightly, does complain of right knee tenderness Sensory intact to light touch  throughout Left lower extremity deep tendon reflex patellar 3/4, no clonus Right lower extremity patellar DTR 2/4, no clonus No Hoffmann's bilaterally, slightly difficult to assess due to her dementia and cooperation   Impression/Assessment:  87 year old female with  1.  Acute right-sided weakness, arm and leg, questionable etiology  Plan:  -Right shoulder mass, per Ortho likely incidental and unrelated, I agree given her right lower extremity symptoms as well -MRI on T1 sagittal shows upper cervical spine, there is moderate to severe stenosis at C2-3, 3-4, 4-5 (partially visualized), dedicated MRI cervical spine ordered stat -We will attempt to reach patient's family, likely a very poor surgical candidate given her age -I question her history given her dementia, however she may have had a fall that she does not remember although there is no evidence of this. -Further recommendations once MRI cervical spine is completed -Continue bedrest for the time being  Thank you for allowing me to participate in this patient's care.  Please do not hesitate to call with questions or concerns.   Monia Pouch, DO Neurosurgeon [facility] Cell: [phone]

## 2020-06-10 NOTE — Anesthesia Procedure Notes (Addendum)
Arterial Line Insertion Start/End3/17/2022 5:40 PM, 06/10/2020 5:50 PM Performed by: Lewie Loron, MD, anesthesiologist  Patient location: Pre-op. Preanesthetic checklist: patient identified, IV checked, site marked, risks and benefits discussed, surgical consent, monitors and equipment checked, pre-op evaluation, timeout performed and anesthesia consent Lidocaine 1% used for infiltration Right, radial was placed Catheter size: 20 Fr Hand hygiene performed  and maximum sterile barriers used   Attempts: 3 Procedure performed without using ultrasound guided technique. Ultrasound Notes:anatomy identified, needle tip was noted to be adjacent to the nerve/plexus identified and no ultrasound evidence of intravascular and/or intraneural injection Following insertion, dressing applied and Biopatch. Post procedure assessment: normal and unchanged  Patient tolerated the procedure well with no immediate complications.

## 2020-06-10 NOTE — Anesthesia Procedure Notes (Signed)
Central Venous Catheter Insertion Performed by: Lewie Loron, MD, anesthesiologist Start/End3/17/2022 5:50 PM, 06/10/2020 6:10 PM Patient location: Pre-op. Preanesthetic checklist: patient identified, IV checked, site marked, risks and benefits discussed, surgical consent, monitors and equipment checked, pre-op evaluation, timeout performed and anesthesia consent Position: Trendelenburg Lidocaine 1% used for infiltration and patient sedated Hand hygiene performed , maximum sterile barriers used  and Seldinger technique used Catheter size: 8 Fr Total catheter length 16. Central line was placed.Double lumen Procedure performed using ultrasound guided technique. Ultrasound Notes:anatomy identified, needle tip was noted to be adjacent to the nerve/plexus identified, no ultrasound evidence of intravascular and/or intraneural injection and image(s) printed for medical record Attempts: 1 Following insertion, dressing applied, line sutured and Biopatch. Post procedure assessment: blood return through all ports, free fluid flow and no air  Patient tolerated the procedure well with no immediate complications.

## 2020-06-10 NOTE — ED Triage Notes (Signed)
Pt BIB [facility] from home, pt c/o right shoulder pain, seen by PCP yesterday for same and received a steroid shot. Throughout the day yesterday, pt had increased weakness to right arm and right leg. Pt able to move both at this time, grip strengths equal.

## 2020-06-10 NOTE — Progress Notes (Signed)
   Providing Compassionate, Quality Care - Together  NEUROSURGERY PROGRESS NOTE   S: Patient seen and examined in PACU, intubated, sedation weaning  O: EXAM:  BP (!) 148/53 (BP Location: Left Arm)   Pulse (!) 54   Temp (!) 91.22 F (32.9 C)   Resp 18   Ht 5' (1.524 m)   Wt 59.9 kg   SpO2 100%   BMI 25.79 kg/m   Eyes open spontaneously, intubated Pupils equally round reactive light Face symmetric Strong bilateral lower extremity withdrawal to pain Left upper extremity withdrawal to pain Right upper extremity no withdrawal to pain Hemovac in place Incision clean dry and intact Not following commands at this point  ASSESSMENT:  87 y.o. female with   1.  Cervical stenosis with cord signal change due to spontaneous epidural hematoma, cervical spine  -Status post C3-6 decompression and fusion on 06/10/2020  PLAN: -ICU for vent management -Map goals greater than 70 -Monitor Hemovac -Pain control -Hold all anticoagulation at this time, platelets given Intra-Op due to aspirin regimen -SCDs -GI prophylaxis -I spoke to and updated the family about the surgery and her remaining intubated.    Thank you for allowing me to participate in this patient's care.  Please do not hesitate to call with questions or concerns.   Monia Pouch, DO Neurosurgeon [facility] Cell: [phone]

## 2020-06-10 NOTE — H&P (Addendum)
History and Physical    Suzanne Fox RAQ:[id number] DOB: March 09, 1930 DOA: 06/10/2020  PCP: Georgianne Fick, MD Consultants:  Stacie Acres - podiatry Patient coming from:  Home - lives with ; NOK: Niece, Laruth Bouchard, [phone]  Chief Complaint:  Shoulder pain  HPI: Suzanne Fox is a 87 y.o. female with medical history significant of PVD; HTN; HLD; dementia; and DM presenting with R shoulder pain.  She reports that the pain started today, but she was actually seen by her PCP yesterday for this issue.  I called her PCP and was told that she was diagnosed with spasms of paraspinous neck muscles, R > L with radiation to R shoulder.  She was given Prednisone and Toradol.  Her PCP has not discussed advanced directives with her.  At the time of my evaluation, she reported pain in the R shoulder but inability to move her R arm or leg.      ED Course:  Acute R-sided weakness and pain, starting with shoulder.  Brain MRI negative but with potential spinal cord impingement.  Neurosurgery to see, recommends MRI.  Ortho is seeing for 10 cm R shoulder mass and R knee pain with erythema.  Review of Systems: As per HPI; otherwise review of systems reviewed and negative.   Ambulatory Status:  Ambulates with cane or walker  COVID Vaccine Status:   Complete  Past Medical History:  Diagnosis Date  . Arthritis   . Dementia without behavioral disturbance (HCC) 06/10/2020  . Diabetes mellitus   . Heart murmur   . Hiatal hernia   . Hyperlipidemia   . Hypertension   . Hypothyroidism (acquired) 06/10/2020  . Peripheral vascular disease [facility])     Past Surgical History:  Procedure Laterality Date  . ABDOMINAL HYSTERECTOMY    . CHOLECYSTECTOMY      Social History   Socioeconomic History  . Marital status: Widowed    Spouse name: Not on file  . Number of children: 0  . Years of education: 75  . Highest education level: Not on file  Occupational History  . Occupation: Retired    Associate Professor:  RETIRED  Tobacco Use  . Smoking status: Former Smoker    Packs/day: 1.00    Years: 50.00    Pack years: 50.00    Types: Cigarettes    Quit date: 08/27/2004    Years since quitting: 15.7  . Smokeless tobacco: Never Used  Substance and Sexual Activity  . Alcohol use: No  . Drug use: No  . Sexual activity: Not on file  Other Topics Concern  . Not on file  Social History Narrative   Patient lives at home alone.   Caffeine use: 4 cups daily   Social Determinants of Health   Financial Resource Strain: Not on file  Food Insecurity: Not on file  Transportation Needs: Not on file  Physical Activity: Not on file  Stress: Not on file  Social Connections: Not on file  Intimate Partner Violence: Not on file    Allergies  Allergen Reactions  . Aricept [Donepezil]     Other reaction(s): localised rash  . Other Other (See Comments)    Family History  Problem Relation Age of Onset  . Other Mother        At childbirth  . Prostate cancer Father   . Cancer Maternal Aunt   . Asthma Sister   . Diabetes Sister   . Diabetes Sister   . Diabetes Sister     Prior to Admission  medications   Medication Sig Start Date End Date Taking? Authorizing Provider  amLODipine (NORVASC) 10 MG tablet Take 10 mg by mouth daily.   Yes [provider]  aspirin EC 81 MG tablet Take 81 mg by mouth daily.   Yes [provider]  atenolol-chlorthalidone (TENORETIC) 50-25 MG per tablet Take 1 tablet by mouth daily.   Yes [provider]  atorvastatin (LIPITOR) 40 MG tablet Take 40 mg by mouth daily.   Yes [provider]  augmented betamethasone dipropionate (DIPROLENE-AF) 0.05 % ointment Apply 1 application topically daily as needed (rash). 10/27/19  Yes [provider]  cloNIDine (CATAPRES) 0.1 MG tablet Take 0.1 mg by mouth 2 (two) times daily. 09/29/19  Yes [provider]  levothyroxine (SYNTHROID, LEVOTHROID) 25 MCG tablet Take 25 mcg by mouth daily.     Yes [provider]  lisinopril (PRINIVIL,ZESTRIL) 40 MG tablet Take 80 mg by mouth daily.    Yes [provider]  memantine (NAMENDA) 10 MG tablet Take 10 mg by mouth every morning. 12/09/19  Yes [provider]    Physical Exam: Vitals:   06/10/20 1330 06/10/20 1504 06/10/20 1505 06/10/20 1600  BP: (!) 155/56  (!) 148/61 (!) 148/53  Pulse: (!) 56  (!) 53   Resp: (!) 30  20 16   Temp:  (!) 97.4 F (36.3 C)  (!) 97.5 F (36.4 C)  TempSrc:  Oral  Oral  SpO2: 100%  100% 100%  Weight:      Height:         . General:  Appears calm and comfortable and is in NAD . Eyes:  PERRL, EOMI, normal lids, iris . ENT:  Hard of hearing, grossly normal lips & tongue, mmm . Neck:  no LAD, masses or thyromegaly . Cardiovascular:  RR with bradycardia, no m/r/g. No LE edema.  Respiratory:   CTA bilaterally with no wheezes/rales/rhonchi.  Normal respiratory effort. . Abdomen:  soft, NT, ND . Neck:   normal alignment, no TTP along C-spine . Skin:  no rash or induration seen on limited exam . Musculoskeletal:  Marked difficulty moving RUE or RLE . Lower extremity:  No LE edema.  Limited foot exam with no ulcerations.  2+ distal pulses. Marland Kitchen Psychiatric:  blunted mood and affect, speech fluent and appropriate, AOx3 . Neurologic:  CN 2-12 grossly intact    Radiological Exams on Admission: Independently reviewed - see discussion in A/P where applicable  CT CERVICAL SPINE WO CONTRAST  Result Date: 06/10/2020 CLINICAL DATA:  Spinal stenosis. Concern for hemorrhage on same day MRI of the cervical spine. EXAM: CT CERVICAL SPINE WITHOUT CONTRAST TECHNIQUE: Multidetector CT imaging of the cervical spine was performed without intravenous contrast. Multiplanar CT image reconstructions were also generated. COMPARISON:  Same day MRI of the cervical spine. FINDINGS: Alignment: Similar reversal of the normal cervical lordosis. Slight anterolisthesis of C3 on C4 and retrolisthesis of C4 on  C5, favor degenerative given severe degenerative changes at these levels. Dextrocurvature. Skull base and vertebrae: No evidence of acute fracture. Vertebral body heights are maintained. Soft tissues and spinal canal: Ventral epidural abnormality seen on same day MRI is hyperdense on CT (for example see series 11, image 43) and does not correlate with bone, compatible with hemorrhage. Additional hyperdensity in the canal more superiorly at the foramen magnum/craniocervical junction, suggestive of hemorrhage. Disc levels: Please see same day MRI for characterization of multilevel degenerative change, including multilevel severe canal stenosis. Upper chest: Limited visualization lung  apices without evidence of consolidation. Other: Calcific atherosclerosis of the carotid arteries. IMPRESSION: 1. Ventral epidural abnormality seen on same day MRI is hyperdense on CT and does not correlate with bone, compatible with acute/recent hemorrhage. Additional canal hemorrhage is noted more superiorly at the foramen magnum/craniocervical junction. 2. Please see same day MRI for characterization of multilevel severe canal stenosis and cord edema. 3. No evidence of acute fracture. Electronically Signed   By: Feliberto Harts MD   On: 06/10/2020 16:05   MR BRAIN WO CONTRAST  Result Date: 06/10/2020 CLINICAL DATA:  Increasing weakness in the right arm and leg. EXAM: MRI HEAD WITHOUT CONTRAST TECHNIQUE: Multiplanar, multiecho pulse sequences of the brain and surrounding structures were obtained without intravenous contrast. COMPARISON:  01/21/2014 FINDINGS: Brain: No convincing infarction, hemorrhage, hydrocephalus, extra-axial collection or mass lesion. Minimal diffusion hyperintensity in the region of the lower left thalamus on coronal acquisition is not confirmed on the axial diffusion or other sequences. Cerebral volume which has progressed from 2015. Chronic small vessel ischemic change in the deep cerebral white matter that  is mild for age, also mildly progressed. Clustered CSF intensity cystic spaces along the body and splenium of the corpus callosum on the left, stable and attributed to dilated perivascular spaces. Vascular: The left cervical ICA is difficult to visualize on T2 weighted imaging but appears stable and symmetric on T1 weighted imaging when compared with prior. Skull and upper cervical spine: Advanced cervical spine degeneration with probable cord impingement at C2-3, C3-4, and C4-5. Sinuses/Orbits: No emergent finding. Bilateral cataract resection. Partial left mastoid opacification with negative nasopharynx. IMPRESSION: 1. No acute intracranial finding. 2. Brain involution that has progressed from 2015. 3. Cervical spine degeneration with multilevel upper cord impingement. 4. Partial left mastoid opacification with negative nasopharynx. Electronically Signed   By: Marnee Spring M.D.   On: 06/10/2020 07:33   MR CERVICAL SPINE WO CONTRAST  Result Date: 06/10/2020 CLINICAL DATA:  Increasing right arm and right leg weakness. Myelopathy. EXAM: MRI CERVICAL SPINE WITHOUT CONTRAST TECHNIQUE: Multiplanar, multisequence MR imaging of the cervical spine was performed. No intravenous contrast was administered. COMPARISON:  None. FINDINGS: Alignment: There is marked reversal of the normal cervical lordosis with the apex at C4. Vertebrae: No fracture or worrisome lesion. There is some degenerative endplate signal change from C2-3 to C5-6, worst at C5-6. Cord: There is edema in the cord from the foramen magnum to approximately mid C6. Posterior Fossa, vertebral arteries, paraspinal tissues: Empty sella is noted. The patient also has a 1 cm T2 hyperintense lesion left lobe of the thyroid. Not clinically significant; no follow-up imaging recommended (ref: J Am Coll Radiol. 2015 Feb;12(2): 143-50). Disc levels: C2-3: Disc bulge, ligamentum flavum thickening and right worse than left facet degenerative disease. There is severe  central canal stenosis. T2 hypointense material is seen on both sides of the cord, greater on the left. This is contiguous with the ventral epidural structure described below. C3-4: Marked loss of disc space height with a bulge and endplate spur. There is facet arthropathy. Partially visualized at this level is a ventral epidural collection or structure measuring approximately 2.5 cm craniocaudal by 0.5 cm AP by up to 2 cm transverse. This area is T1 hypointense and mixed signal intensity on T2 weighted imaging. Severe flattening of the cord is present. Moderate to moderately severe foraminal narrowing is worse on the left. C4-5: Loss of disc space height with endplate spur. Cyst severe central canal stenosis and moderately severe bilateral foraminal narrowing.  C5-6: Loss of disc space height with endplate spur and a bulge. Severe central canal stenosis and moderately severe to severe foraminal narrowing, worse on the left. C6-7: Shallow disc bulge and ligamentum flavum thickening. The central canal and foramina appear open. C7-T1: Negative. IMPRESSION: Ventral epidural collection or structure is likely due to hematoma but could represent ossification of the posterior longitudinal ligament. Hematoma is favored signal intensity is similar to that seen about the cord at the C2 level. CT of the cervical spine could be used to assess for ossification of the posterior longitudinal ligament. Severe central canal stenosis at C2-3, C3-4, C4-5 and C5-6. There is edema within the cord from approximately the foramen magnum through the C6 level. The extent of edema is worrisome for acute abnormality. Critical Value/emergent results were called by telephone at the time of interpretation on 06/10/2020 at 2:46 pm to provider TROY DAWLEY , who verbally acknowledged these results. Electronically Signed   By: Drusilla Kannerhomas  Dalessio M.D.   On: 06/10/2020 15:02   DG Pelvis Portable  Result Date: 06/10/2020 CLINICAL DATA:  Initial  evaluation for acute pain status post fall. EXAM: PORTABLE PELVIS 1-2 VIEWS COMPARISON:  None. FINDINGS: Right hip in internal rotation, somewhat limiting assessment. There is suspected cortical irregularity with possible impaction at the subcapital right femoral neck, which could reflect an acute fracture. Femoral head remains in normal alignment within the acetabulum. Femoral head height maintained. No acute abnormality about the left hip. Bony pelvis intact. SI joints approximated. No pubic diastasis. Advanced degenerative spondylosis noted within the lower lumbar spine. Osteopenia. No visible soft tissue injury. IMPRESSION: 1. Question cortical irregularity with possible impaction at the subcapital right femoral neck, suspicious for possible fracture. Correlation with physical exam recommended. Additionally, further assessment with dedicated radiographs of the right hip could be performed as clinically warranted. 2. No other acute osseous abnormality about the pelvis. Electronically Signed   By: Rise MuBenjamin  McClintock M.D.   On: 06/10/2020 03:59   MR Shoulder Right W Wo Contrast  Result Date: 06/10/2020 CLINICAL DATA:  Severe right shoulder pain. EXAM: MRI OF THE RIGHT SHOULDER WITHOUT AND WITH CONTRAST TECHNIQUE: Multiplanar, multisequence MR imaging of the right shoulder was performed before and after the administration of intravenous contrast. CONTRAST:  5 mL GADAVIST IV SOLN COMPARISON:  Plain films right shoulder 06/10/2020. FINDINGS: Rotator cuff:  There is rotator cuff tendinopathy without tear. Muscles: Mild-to-moderate fatty atrophy of musculature of the shoulder girdle is identified. Biceps long head:  Completely torn from the superior labrum. Acromioclavicular Joint: Moderate osteoarthritis. Type 2 acromion. There is a massive volume of subacromial/subdeltoid fluid. Glenohumeral Joint: Severe degenerative change is present with marked flattening and remodeling of both the glenoid and the humeral  head. Osteophyte off the humeral head is noted. Labrum:  Diffusely torn. Bones:  No fracture or focal lesion. Other: None. IMPRESSION: No acute abnormality. Severe glenohumeral osteoarthritis. Massive volume of subacromial/subdeltoid fluid consistent with bursitis. Complete tear of the long head of biceps from the superior labrum. Rotator cuff tendinopathy without tear. Mild atrophy of musculature about the shoulder is likely due to disuse. Moderate acromioclavicular osteoarthritis. Electronically Signed   By: Drusilla Kannerhomas  Dalessio M.D.   On: 06/10/2020 15:07   DG Chest Port 1 View  Result Date: 06/10/2020 CLINICAL DATA:  Initial evaluation for acute trauma, fall. EXAM: PORTABLE CHEST 1 VIEW COMPARISON:  None available. FINDINGS: Transverse heart size within normal limits. Mediastinal silhouette normal. Aortic atherosclerosis. Lungs normally inflated. No focal infiltrates. No pulmonary edema or  pleural effusion. Minimal left basilar subsegmental atelectasis and/or scarring. No visible pneumothorax. No acute osseous finding. Prominent osteoarthritic changes noted about the right shoulder. Underlying osteopenia. IMPRESSION: 1. Minimal left basilar subsegmental atelectasis and/or scarring. 2. No other active cardiopulmonary disease. 3.  Aortic Atherosclerosis (ICD10-I70.0). Electronically Signed   By: Rise Mu M.D.   On: 06/10/2020 04:40   DG Shoulder Right Port  Result Date: 06/10/2020 CLINICAL DATA:  Initial evaluation for acute pain status post fall. EXAM: PORTABLE RIGHT SHOULDER COMPARISON:  None available. FINDINGS: No visible acute fracture or dislocation. Advanced osteoarthritic changes seen about the right glenohumeral articulation, with more mild to moderate changes about the right acromioclavicular joint. No visible soft tissue abnormality. Underlying osteopenia. Visualized right hemithorax clear. IMPRESSION: 1. No acute osseous abnormality about the right shoulder. 2. Advanced osteoarthritic  changes about the right glenohumeral articulation. 3. Osteopenia. Electronically Signed   By: Rise Mu M.D.   On: 06/10/2020 03:51   DG Knee Right Port  Result Date: 06/10/2020 CLINICAL DATA:  Initial evaluation for acute trauma, fall. EXAM: PORTABLE RIGHT KNEE - 1-2 VIEW COMPARISON:  None. FINDINGS: No acute fracture dislocation. Moderate osteoarthritic changes present about the knee. No joint effusion. No visible soft tissue injury. Osteopenia noted. Prominent vascular calcifications noted at the posterior right thigh/knee. IMPRESSION: 1. No acute osseous abnormality about the right knee. 2. Moderate degenerative osteoarthrosis. Electronically Signed   By: Rise Mu M.D.   On: 06/10/2020 04:42   DG Hip Unilat W or Wo Pelvis 2-3 Views Right  Result Date: 06/10/2020 CLINICAL DATA:  Initial evaluation for acute trauma, fall. EXAM: DG HIP (WITH OR WITHOUT PELVIS) 2-3V RIGHT COMPARISON:  Prior radiograph of the pelvis from earlier the same day. FINDINGS: Additional views of the right hip demonstrate no acute fracture or dislocation. Femoral head in normal alignment within the acetabulum. Femoral head height maintained. Visualized bony pelvis intact. Mild osteoarthritic changes noted about the hips. No visible soft tissue injury. Advanced degenerative spondylosis again noted within the lower lumbar spine. Vascular calcifications overlying the pelvis and thigh noted. IMPRESSION: 1. No acute fracture or dislocation. 2. Previously questioned cortical irregularity is not seen on these additional views of the right hip. This was likely positional on prior exam. Electronically Signed   By: Rise Mu M.D.   On: 06/10/2020 04:45    EKG: Independently reviewed.  NSR with rate 63; no evidence of acute ischemia   Labs on Admission: I have personally reviewed the available labs and imaging studies at the time of the admission.  Pertinent labs:   Na++ 128 CO2 19 Glucose  184 Anion gap 16 WBC 9.5 Hgb 11.1 COVID/flu negative   Assessment/Plan Principal Problem:   Nontraumatic epidural hematoma (HCC) Active Problems:   Right shoulder pain   Dementia without behavioral disturbance (HCC)   Hypertension   Hyperlipidemia   Hypothyroidism (acquired)   R hemiparesis -Imaging indicates a spontaneous epidural hematoma along the entire C-spine -On 81 mg ASA PTA and received Toradol injection the day prior - unclear if related -Neurosurgery ordered stat CT -Dr. Jake Samples contacted NOK to determine if emergent surgery is desired; they did -Her outcome is very tenuous -Surgery at 5pm -Full code -Family is aware of the gravity of the situation - no plan for trach/PEG -Likely to come out to neurosurgery ICU if she survives -Patient will be transferred to neurosurgery service  R shoulder joint effusion -Appears to be bursal inflammation -Will defer to orthopedics post-operatively  Dementia -Resume Namenda  when appropriate  HTN -Resume Norvasc, Tenoretic, Catapres, Lisinopril when appropriate  HLD -Resume Lipitor when appropriate  Hypothyroidism -Continue Synthroid at current dose for now    Note: This patient has been tested and is negative for the novel coronavirus COVID-19. The patient has been fully vaccinated against COVID-19.    Total critical care time: 65 minutes Critical care time was exclusive of separately billable procedures and treating other patients. Critical care was necessary to treat or prevent imminent or life-threatening deterioration. Critical care was time spent personally by me on the following activities: development of treatment plan with patient and/or surrogate as well as nursing, discussions with consultants, evaluation of patient's response to treatment, examination of patient, obtaining history from patient or surrogate, ordering and performing treatments and interventions, ordering and review of laboratory studies,  ordering and review of radiographic studies, pulse oximetry and re-evaluation of patient's condition.   Level of care: In OR - will go to neurosurgical ICU post-operatively DVT prophylaxis:  SCDs Code Status: Full - confirmed with patient/family Family Communication: None present; Dr. Jake Samples spoke with the patient's niece by telephone at the time of admission. Disposition Plan:  The patient is from: home  Anticipated d/c is to: be determined  Anticipated d/c date will depend on clinical response to treatment  Patient is currently: critically ill Consults called: Neurosurgery; orthopedics Admission status:  Admit - It is my clinical opinion that admission to INPATIENT is reasonable and necessary because of the expectation that this patient will require hospital care that crosses at least 2 midnights to treat this condition based on the medical complexity of the problems presented.  Given the aforementioned information, the predictability of an adverse outcome is felt to be significant.    Jonah Blue MD [facility]   How to contact the [facility] Attending or Consulting provider 7A - 7P or covering provider during after hours 7P -7A, for this patient?  1. Check the care team in [facility] and look for a) attending/consulting [facility] provider listed and b) the [facility] team listed 2. Log into [email or web address] and use [place]'s universal password to access. If you do not have the password, please contact the hospital operator. 3. Locate the [facility] provider you are looking for under [facility] and page to a number that you can be directly reached. 4. If you still have difficulty reaching the provider, please page the [facility] (Director on Call) for the Hospitalists listed on amion for assistance.   06/10/2020, 7:01 PM

## 2020-06-10 NOTE — Progress Notes (Signed)
I was called by radiology with emergent results of the patient's MRI findings.  There is severe stenosis throughout the cervical spine with evidence of a ventral epidural hematoma likely.  There is cord signal change from the medulla down throughout the cervical spine.  I called the patient's family, Cherrie and discussed the findings with the patient's family.  We discussed 2 options, 1 being posterior cervical decompression and fusion and this would be her only opportunity of any neurologic improvement.  The other option we did discuss was palliative/hospice care given her age.  The family discussed among her sisters and agreed that they would like to proceed with surgical intervention as an opportunity for improvement despite risks of paralysis, no improvement, minimal improvement, tracheostomy, PEG tube, infection, bleeding, heart attack, stroke, death.  I described to them in detail the surgery and answered all their questions.  We extensively went over the expected outcomes, recovery process and her likelihood of not returning to an independent state.  However given her hemiplegia and the severe stenosis, surgical intervention may give her an opportunity of improvement.  Thankfully her symptoms began yesterday afternoon per the family.  I am boarding her for an emergency posterior cervical decompression and fusion. They understand all of the risks and wish to proceed.     Monia Pouch, DO Neurosurgery

## 2020-06-10 NOTE — ED Notes (Signed)
Pt in CT.

## 2020-06-10 NOTE — Consult Note (Signed)
Reason for Consult:Right shoulder mass Referring Physician: Stanton Kidney Time called: 0910 Time at bedside: 0926   Suzanne Fox is an 87 y.o. female.  HPI: Suzanne Fox comes to the ED for evaluation of right shoulder pain and weakness. She woke up yesterday with acute right shoulder pain, denies known trauma. She saw her PCP who gave her cortisone and anti-inflammatory injection (not in joint) but she only got worse and presented to the ED last night. She denies prior hx/o similar. She is LHD.  Past Medical History:  Diagnosis Date  . Arthritis   . Diabetes mellitus   . Heart murmur   . Hiatal hernia   . Hyperlipidemia   . Hypertension   . Peripheral vascular disease [facility])     Past Surgical History:  Procedure Laterality Date  . ABDOMINAL HYSTERECTOMY    . CHOLECYSTECTOMY      Family History  Problem Relation Age of Onset  . Other Mother        At childbirth  . Prostate cancer Father   . Cancer Maternal Aunt   . Asthma Sister   . Diabetes Sister   . Diabetes Sister   . Diabetes Sister     Social History:  reports that she quit smoking about 15 years ago. Her smoking use included cigarettes. She has a 50.00 pack-year smoking history. She has never used smokeless tobacco. She reports that she does not drink alcohol and does not use drugs.  Allergies:  Allergies  Allergen Reactions  . Aricept [Donepezil]     Other reaction(s): localised rash  . Other Other (See Comments)    Medications: I have reviewed the patient's current medications.  Results for orders placed or performed during the hospital encounter of 06/10/20 (from the past 48 hour(s))  Basic metabolic panel     Status: Abnormal   Collection Time: 06/10/20  1:14 AM  Result Value Ref Range   Sodium 128 (L) 135 - 145 mmol/L   Potassium 3.6 3.5 - 5.1 mmol/L   Chloride 93 (L) 98 - 111 mmol/L   CO2 19 (L) 22 - 32 mmol/L   Glucose, Bld 184 (H) 70 - 99 mg/dL    Comment: Glucose reference range applies only to  samples taken after fasting for at least 8 hours.   BUN 15 8 - 23 mg/dL   Creatinine, Ser 1.09 0.44 - 1.00 mg/dL   Calcium 9.8 8.9 - 32.3 mg/dL   GFR, Estimated >55 >73 mL/min    Comment: (NOTE) Calculated using the CKD-EPI Creatinine Equation (2021)    Anion gap 16 (H) 5 - 15    Comment: Performed at [facility], [address], [place], [place] [address]  CBC     Status: Abnormal   Collection Time: 06/10/20  1:14 AM  Result Value Ref Range   WBC 9.5 4.0 - 10.5 K/uL   RBC 4.09 3.87 - 5.11 MIL/uL   Hemoglobin 11.1 (L) 12.0 - 15.0 g/dL   HCT 42.7 (L) 06.2 - 37.6 %   MCV 86.6 80.0 - 100.0 fL   MCH 27.1 26.0 - 34.0 pg   MCHC 31.4 30.0 - 36.0 g/dL   RDW 28.3 15.1 - 76.1 %   Platelets 210 150 - 400 K/uL   nRBC 0.0 0.0 - 0.2 %    Comment: Performed at [facility], [address], [place], [place] [address]  Resp Panel by RT-PCR (Flu A&B, Covid) Nasopharyngeal Swab     Status: None  Collection Time: 06/10/20  5:52 AM   Specimen: Nasopharyngeal Swab; Nasopharyngeal(NP) swabs in vial transport medium  Result Value Ref Range   SARS Coronavirus 2 by RT PCR NEGATIVE NEGATIVE    Comment: (NOTE) SARS-CoV-2 target nucleic acids are NOT DETECTED.  The SARS-CoV-2 RNA is generally detectable in upper respiratory specimens during the acute phase of infection. The lowest concentration of SARS-CoV-2 viral copies this assay can detect is 138 copies/mL. A negative result does not preclude SARS-Cov-2 infection and should not be used as the sole basis for treatment or other patient management decisions. A negative result may occur with  improper specimen collection/handling, submission of specimen other than nasopharyngeal swab, presence of viral mutation(s) within the areas targeted by this assay, and inadequate number of viral copies(<138 copies/mL). A negative result must be combined with clinical observations, patient history, and epidemiological information. The expected result  is Negative.  Fact Sheet for Patients:  BloggerCourse.com  Fact Sheet for Healthcare Providers:  SeriousBroker.it  This test is no t yet approved or cleared by the [place] FDA and  has been authorized for detection and/or diagnosis of SARS-CoV-2 by FDA under an Emergency Use Authorization (EUA). This EUA will remain  in effect (meaning this test can be used) for the duration of the COVID-19 declaration under Section 564(b)(1) of the Act, 21 U.S.C.section 360bbb-3(b)(1), unless the authorization is terminated  or revoked sooner.       Influenza A by PCR NEGATIVE NEGATIVE   Influenza B by PCR NEGATIVE NEGATIVE    Comment: (NOTE) The Xpert Xpress SARS-CoV-2/FLU/RSV plus assay is intended as an aid in the diagnosis of influenza from Nasopharyngeal swab specimens and should not be used as a sole basis for treatment. Nasal washings and aspirates are unacceptable for Xpert Xpress SARS-CoV-2/FLU/RSV testing.  Fact Sheet for Patients: BloggerCourse.com  Fact Sheet for Healthcare Providers: SeriousBroker.it  This test is not yet approved or cleared by the [place] FDA and has been authorized for detection and/or diagnosis of SARS-CoV-2 by FDA under an Emergency Use Authorization (EUA). This EUA will remain in effect (meaning this test can be used) for the duration of the COVID-19 declaration under Section 564(b)(1) of the Act, 21 U.S.C. section 360bbb-3(b)(1), unless the authorization is terminated or revoked.  Performed at [facility], [address] [place], [place] [address]     MR BRAIN WO CONTRAST  Result Date: 06/10/2020 CLINICAL DATA:  Increasing weakness in the right arm and leg. EXAM: MRI HEAD WITHOUT CONTRAST TECHNIQUE: Multiplanar, multiecho pulse sequences of the brain and surrounding structures were obtained without intravenous contrast. COMPARISON:   01/21/2014 FINDINGS: Brain: No convincing infarction, hemorrhage, hydrocephalus, extra-axial collection or mass lesion. Minimal diffusion hyperintensity in the region of the lower left thalamus on coronal acquisition is not confirmed on the axial diffusion or other sequences. Cerebral volume which has progressed from 2015. Chronic small vessel ischemic change in the deep cerebral white matter that is mild for age, also mildly progressed. Clustered CSF intensity cystic spaces along the body and splenium of the corpus callosum on the left, stable and attributed to dilated perivascular spaces. Vascular: The left cervical ICA is difficult to visualize on T2 weighted imaging but appears stable and symmetric on T1 weighted imaging when compared with prior. Skull and upper cervical spine: Advanced cervical spine degeneration with probable cord impingement at C2-3, C3-4, and C4-5. Sinuses/Orbits: No emergent finding. Bilateral cataract resection. Partial left mastoid opacification with negative nasopharynx. IMPRESSION: 1. No acute intracranial  finding. 2. Brain involution that has progressed from 2015. 3. Cervical spine degeneration with multilevel upper cord impingement. 4. Partial left mastoid opacification with negative nasopharynx. Electronically Signed   By: Marnee SpringJonathon  Watts M.D.   On: 06/10/2020 07:33   DG Pelvis Portable  Result Date: 06/10/2020 CLINICAL DATA:  Initial evaluation for acute pain status post fall. EXAM: PORTABLE PELVIS 1-2 VIEWS COMPARISON:  None. FINDINGS: Right hip in internal rotation, somewhat limiting assessment. There is suspected cortical irregularity with possible impaction at the subcapital right femoral neck, which could reflect an acute fracture. Femoral head remains in normal alignment within the acetabulum. Femoral head height maintained. No acute abnormality about the left hip. Bony pelvis intact. SI joints approximated. No pubic diastasis. Advanced degenerative spondylosis noted  within the lower lumbar spine. Osteopenia. No visible soft tissue injury. IMPRESSION: 1. Question cortical irregularity with possible impaction at the subcapital right femoral neck, suspicious for possible fracture. Correlation with physical exam recommended. Additionally, further assessment with dedicated radiographs of the right hip could be performed as clinically warranted. 2. No other acute osseous abnormality about the pelvis. Electronically Signed   By: Rise MuBenjamin  McClintock M.D.   On: 06/10/2020 03:59   DG Chest Port 1 View  Result Date: 06/10/2020 CLINICAL DATA:  Initial evaluation for acute trauma, fall. EXAM: PORTABLE CHEST 1 VIEW COMPARISON:  None available. FINDINGS: Transverse heart size within normal limits. Mediastinal silhouette normal. Aortic atherosclerosis. Lungs normally inflated. No focal infiltrates. No pulmonary edema or pleural effusion. Minimal left basilar subsegmental atelectasis and/or scarring. No visible pneumothorax. No acute osseous finding. Prominent osteoarthritic changes noted about the right shoulder. Underlying osteopenia. IMPRESSION: 1. Minimal left basilar subsegmental atelectasis and/or scarring. 2. No other active cardiopulmonary disease. 3.  Aortic Atherosclerosis (ICD10-I70.0). Electronically Signed   By: Rise MuBenjamin  McClintock M.D.   On: 06/10/2020 04:40   DG Shoulder Right Port  Result Date: 06/10/2020 CLINICAL DATA:  Initial evaluation for acute pain status post fall. EXAM: PORTABLE RIGHT SHOULDER COMPARISON:  None available. FINDINGS: No visible acute fracture or dislocation. Advanced osteoarthritic changes seen about the right glenohumeral articulation, with more mild to moderate changes about the right acromioclavicular joint. No visible soft tissue abnormality. Underlying osteopenia. Visualized right hemithorax clear. IMPRESSION: 1. No acute osseous abnormality about the right shoulder. 2. Advanced osteoarthritic changes about the right glenohumeral  articulation. 3. Osteopenia. Electronically Signed   By: Rise MuBenjamin  McClintock M.D.   On: 06/10/2020 03:51   DG Knee Right Port  Result Date: 06/10/2020 CLINICAL DATA:  Initial evaluation for acute trauma, fall. EXAM: PORTABLE RIGHT KNEE - 1-2 VIEW COMPARISON:  None. FINDINGS: No acute fracture dislocation. Moderate osteoarthritic changes present about the knee. No joint effusion. No visible soft tissue injury. Osteopenia noted. Prominent vascular calcifications noted at the posterior right thigh/knee. IMPRESSION: 1. No acute osseous abnormality about the right knee. 2. Moderate degenerative osteoarthrosis. Electronically Signed   By: Rise MuBenjamin  McClintock M.D.   On: 06/10/2020 04:42   DG Hip Unilat W or Wo Pelvis 2-3 Views Right  Result Date: 06/10/2020 CLINICAL DATA:  Initial evaluation for acute trauma, fall. EXAM: DG HIP (WITH OR WITHOUT PELVIS) 2-3V RIGHT COMPARISON:  Prior radiograph of the pelvis from earlier the same day. FINDINGS: Additional views of the right hip demonstrate no acute fracture or dislocation. Femoral head in normal alignment within the acetabulum. Femoral head height maintained. Visualized bony pelvis intact. Mild osteoarthritic changes noted about the hips. No visible soft tissue injury. Advanced degenerative spondylosis again noted within the  lower lumbar spine. Vascular calcifications overlying the pelvis and thigh noted. IMPRESSION: 1. No acute fracture or dislocation. 2. Previously questioned cortical irregularity is not seen on these additional views of the right hip. This was likely positional on prior exam. Electronically Signed   By: Rise Mu M.D.   On: 06/10/2020 04:45    Review of Systems  HENT: Negative for ear discharge, ear pain, hearing loss and tinnitus.   Eyes: Negative for photophobia and pain.  Respiratory: Negative for cough and shortness of breath.   Cardiovascular: Negative for chest pain.  Gastrointestinal: Negative for abdominal pain, nausea  and vomiting.  Genitourinary: Negative for dysuria, flank pain, frequency and urgency.  Musculoskeletal: Positive for arthralgias (Right shoulder). Negative for back pain, myalgias and neck pain.  Neurological: Positive for weakness. Negative for dizziness and headaches.  Hematological: Does not bruise/bleed easily.  Psychiatric/Behavioral: The patient is not nervous/anxious.    Blood pressure (!) 145/52, pulse (!) 55, temperature 97.8 F (36.6 C), temperature source Oral, resp. rate 18, height 5' (1.524 m), weight 59.9 kg, SpO2 100 %. Physical Exam Constitutional:      General: She is not in acute distress.    Appearance: She is well-developed. She is not diaphoretic.  HENT:     Head: Normocephalic and atraumatic.  Eyes:     General: No scleral icterus.       Right eye: No discharge.        Left eye: No discharge.     Conjunctiva/sclera: Conjunctivae normal.  Cardiovascular:     Rate and Rhythm: Regular rhythm. Bradycardia present.  Pulmonary:     Effort: Pulmonary effort is normal. No respiratory distress.  Musculoskeletal:     Cervical back: Normal range of motion.     Comments: Right shoulder, elbow, wrist, digits- no skin wounds, soft mobile mass anterior shoulder, nontender, shoulder joint TTP ant/post, crepitance noted with PROM, pt unable to perform any AROM of shoulder, elbow, or hand, no instability, no blocks to motion  Sens  Ax/R/M/U intact  Mot   Ax/ R/ PIN/ M/ AIN/ U 1/5  Rad 2+  Skin:    General: Skin is warm and dry.  Neurological:     Mental Status: She is alert.  Psychiatric:        Behavior: Behavior normal.     Assessment/Plan: Right shoulder mass -- Will need MRI w/wo to evaluate mass. Given entire arm involvement would favor radicular or central etiology, suspect mass is incidental. We will await results of studies. She may be WBAT RUE.    Freeman Caldron, PA-C Orthopedic Surgery [phone] 06/10/2020, 9:32 AM

## 2020-06-10 NOTE — Op Note (Signed)
Providing Compassionate, Quality Care - Together  Date of service: 06/10/2020  PREOP DIAGNOSIS:  Severe cervical stenosis with weakness Ventral spontaneous epidural hematoma, cervical spine, with cord signal change  POSTOP DIAGNOSIS: Same  PROCEDURE: Posterior cervical arthrodesis, lateral mass instrumentation, segmental, C3-C4, C4-C5, C5-C6, DePuy Synthes Symphony system (3.5 mm x 19mm bilaterally at C3, 3.5 mm x 50mm at C4 on the left, 3.5 mm x 33mm on C5 at the left, 3.5 x 12 mm bilaterally at C6) Posterior cervical bilateral laminectomy partial C2, bilateral C3, bilateral C4, bilateral C5, bilateral C6 for decompression of spinal cord Right C4, C5 partial facetectomy for decompression and attempted evacuation of ventral epidural hematoma Use of autograft Use of allograft, vivigen Intraoperative use of microscope for neural decompression and microsurgery Intraoperative use of fluoroscopy, greater than 1 hour Intraoperative use of neuro monitoring, SSEPs  SURGEON: Dr. Kendell Bane C. Syncere Eble, DO  ASSISTANT: Dr. Coletta Memos, MD, he was present for the entirety of the decompression and his assistance was required due to the severity of stenosis.  ANESTHESIA: General Endotracheal  EBL: 300 cc  SPECIMENS: None  DRAINS: Medium Hemovac  COMPLICATIONS: None immediate  CONDITION: Hemodynamically stable, remaining intubated for airway protection  HISTORY: Suzanne Fox is a 87 y.o. female the presented to the emergency department with progressive right-sided weakness onset yesterday.  She takes aspirin at home on a daily regimen, received an injection intramuscular of Toradol yesterday.  Once returning home from her PCP she had progressive right lower extremity weakness and then this morning when she woke up she also had right upper extremity weakness and was complaining of shoulder pain.  She was brought to the hospital and worked up for stroke which was negative but cervical stenosis was  noted.  MRI of the cervical spine was obtained which showed a large ventral epidural mass likely hematoma with severe stenosis and cord signal change from the medulla to C6.  The stenosis was noted at C2-3, 3 4, 4 5, 5 6.  Despite her age, she was living somewhat independently with her twin sister.  I discussed with her and her family about surgical decompression and fusion given the severe stenosis and cord signal change in her progressive neurologic decline.  Our other option was unfortunately no surgical intervention given her age.  We discussed all risks, benefits and expected outcomes and the family and the patient agreed to proceed with surgical intervention.  PROCEDURE IN DETAIL: The patient was brought to the operating room. After induction of general anesthesia, prepositional SSEPs were obtained and noted to be monitorable, the patient's head was placed in a Sugita head holder, the patient was positioned on the operative table in the prone position. All pressure points were meticulously padded. Skin incision was then marked out and prepped and draped in the usual sterile fashion.  A midline incision was made with a 10 blade over the C2-C7 spinous processes.  Using Bovie electrocautery the soft tissue was dissected down to the posterior cervical fascia.  Self-retaining retractors were placed in the wound.  Subperiosteal dissection was performed bilaterally to expose C2-C7 lamina bilaterally and the C3-C6 lateral masses bilaterally.  Deep retractors were placed in the wound.  At this point using a high-speed drill, a pilot hole was created in the left C3 lateral mass and a tap was used to place a lateral mass screw.  Lateral fluoroscopy confirmed the appropriate level at C3.  At this point the microscope was brought into the field for the  decompression and was used for the remainder of the case for neural decompression microsurgery.  Using a high-speed drill a inferior bilateral C2 laminectomy was  performed to the ligamentous attachment bilaterally along C2.  Then bilateral troughs were created with a high-speed drill along the lateral mass/lamina junction bilaterally.  The ligamentum flavum was carefully dissected with a micronerve hook.  The spinous processes were and lamina were carefully removed.  The bone was then saved and morselized for autograft.  At this point the dura and spinal cord were noted to be pulsatile.  A partial facetectomy was then performed inferiorly at C4 and superiorly at C5 on the right side exposing the C5 nerve root.  Carefully using Kerrison rongeurs and micro curettes the lateral edge of the thecal sac was identified and dissected ventrally.  Using a pediatric feeding tube and micronerve hooks and microcurette's, the ventral space was carefully dilated and explored both above and below the C5 nerve root.  There was no significant hematoma that was evacuated.  Given the thecal sac was pulsatile at this point further ventral dissection did not seem necessary.  Hemostasis was achieved with Surgi-Flo and the lateral gutters.  Hemostasis was achieved with Surgi-Flo in the epidural space as well.  Lateral mass pilot holes were created bilaterally at C6 and tapped to 14 mm.  They were felt with a feeler and noted to be circumferentially containing bone.  12 mm lateral mass screws were placed at C6 bilaterally.  This was repeated at C4 on the left, C5 on the left and C3 bilaterally.  All bony purchase felt appropriate.  Lateral fluoroscopy confirmed appropriate placement of hardware.  Appropriate size rods, 45 mm bilaterally, precontoured, were placed in the screws and setscrews were placed and final tightened to the manufacturer's recommendation.  Autograft and allograft was then placed in the lateral gutters.  Deep retractors were taken out of the wound.  Hemostasis was achieved with Surgi-Flo and bipolar cautery.  A medium Hemovac was tunneled laterally and placed in the  epidural space.  The wound was noted to be excellently hemostatic.  The wound was then closed in layers with 0 Vicryl, 2-0 Vicryl, 3-0 Vicryl.  The skin was closed with Dermabond.  Sterile dressing was applied.  Sterile drapes were taken down.  At the end of the case all sponge, needle, and instrument counts were correct. The patient was then transferred to the stretcher, head holder removed, unable to be extubated therefore remained intubated per anesthesia's recommendations, and taken to the post-anesthesia care unit in stable hemodynamic condition.  An ICU bed was obtained for vent management, and I discussed the case directly with critical care medicine and updated the family.

## 2020-06-11 ENCOUNTER — Inpatient Hospital Stay (HOSPITAL_COMMUNITY): Payer: Medicare PPO

## 2020-06-11 ENCOUNTER — Encounter (HOSPITAL_COMMUNITY): Payer: Self-pay | Admitting: Neurological Surgery

## 2020-06-11 DIAGNOSIS — R339 Retention of urine, unspecified: Secondary | ICD-10-CM | POA: Diagnosis present

## 2020-06-11 DIAGNOSIS — E1151 Type 2 diabetes mellitus with diabetic peripheral angiopathy without gangrene: Secondary | ICD-10-CM | POA: Diagnosis present

## 2020-06-11 DIAGNOSIS — E785 Hyperlipidemia, unspecified: Secondary | ICD-10-CM | POA: Diagnosis present

## 2020-06-11 DIAGNOSIS — I621 Nontraumatic extradural hemorrhage: Secondary | ICD-10-CM | POA: Diagnosis not present

## 2020-06-11 DIAGNOSIS — Z87891 Personal history of nicotine dependence: Secondary | ICD-10-CM | POA: Diagnosis not present

## 2020-06-11 DIAGNOSIS — S064X9A Epidural hemorrhage with loss of consciousness of unspecified duration, initial encounter: Secondary | ICD-10-CM | POA: Diagnosis not present

## 2020-06-11 DIAGNOSIS — E039 Hypothyroidism, unspecified: Secondary | ICD-10-CM | POA: Diagnosis present

## 2020-06-11 DIAGNOSIS — E782 Mixed hyperlipidemia: Secondary | ICD-10-CM | POA: Diagnosis not present

## 2020-06-11 DIAGNOSIS — Z20822 Contact with and (suspected) exposure to covid-19: Secondary | ICD-10-CM | POA: Diagnosis present

## 2020-06-11 DIAGNOSIS — E876 Hypokalemia: Secondary | ICD-10-CM | POA: Diagnosis present

## 2020-06-11 DIAGNOSIS — R509 Fever, unspecified: Secondary | ICD-10-CM | POA: Diagnosis not present

## 2020-06-11 DIAGNOSIS — J988 Other specified respiratory disorders: Secondary | ICD-10-CM

## 2020-06-11 DIAGNOSIS — S064XAA Epidural hemorrhage with loss of consciousness status unknown, initial encounter: Secondary | ICD-10-CM | POA: Diagnosis present

## 2020-06-11 DIAGNOSIS — M7551 Bursitis of right shoulder: Secondary | ICD-10-CM | POA: Diagnosis present

## 2020-06-11 DIAGNOSIS — Z79899 Other long term (current) drug therapy: Secondary | ICD-10-CM | POA: Diagnosis not present

## 2020-06-11 DIAGNOSIS — E43 Unspecified severe protein-calorie malnutrition: Secondary | ICD-10-CM | POA: Diagnosis present

## 2020-06-11 DIAGNOSIS — J9601 Acute respiratory failure with hypoxia: Secondary | ICD-10-CM | POA: Diagnosis not present

## 2020-06-11 DIAGNOSIS — E871 Hypo-osmolality and hyponatremia: Secondary | ICD-10-CM | POA: Diagnosis present

## 2020-06-11 DIAGNOSIS — Z833 Family history of diabetes mellitus: Secondary | ICD-10-CM | POA: Diagnosis not present

## 2020-06-11 DIAGNOSIS — M4802 Spinal stenosis, cervical region: Secondary | ICD-10-CM | POA: Diagnosis present

## 2020-06-11 DIAGNOSIS — G8191 Hemiplegia, unspecified affecting right dominant side: Secondary | ICD-10-CM | POA: Diagnosis present

## 2020-06-11 DIAGNOSIS — I10 Essential (primary) hypertension: Secondary | ICD-10-CM | POA: Diagnosis present

## 2020-06-11 DIAGNOSIS — T68XXXA Hypothermia, initial encounter: Secondary | ICD-10-CM | POA: Diagnosis present

## 2020-06-11 DIAGNOSIS — Z825 Family history of asthma and other chronic lower respiratory diseases: Secondary | ICD-10-CM | POA: Diagnosis not present

## 2020-06-11 DIAGNOSIS — Z7982 Long term (current) use of aspirin: Secondary | ICD-10-CM | POA: Diagnosis not present

## 2020-06-11 DIAGNOSIS — X58XXXA Exposure to other specified factors, initial encounter: Secondary | ICD-10-CM | POA: Diagnosis present

## 2020-06-11 DIAGNOSIS — S14109A Unspecified injury at unspecified level of cervical spinal cord, initial encounter: Secondary | ICD-10-CM | POA: Diagnosis present

## 2020-06-11 DIAGNOSIS — Z888 Allergy status to other drugs, medicaments and biological substances status: Secondary | ICD-10-CM | POA: Diagnosis not present

## 2020-06-11 DIAGNOSIS — F0390 Unspecified dementia without behavioral disturbance: Secondary | ICD-10-CM | POA: Diagnosis present

## 2020-06-11 DIAGNOSIS — E1165 Type 2 diabetes mellitus with hyperglycemia: Secondary | ICD-10-CM | POA: Diagnosis present

## 2020-06-11 LAB — CBC
HCT: 25.4 % — ABNORMAL LOW (ref 36.0–46.0)
Hemoglobin: 8.6 g/dL — ABNORMAL LOW (ref 12.0–15.0)
MCH: 27.3 pg (ref 26.0–34.0)
MCHC: 33.9 g/dL (ref 30.0–36.0)
MCV: 80.6 fL (ref 80.0–100.0)
Platelets: 306 10*3/uL (ref 150–400)
RBC: 3.15 MIL/uL — ABNORMAL LOW (ref 3.87–5.11)
RDW: 13.2 % (ref 11.5–15.5)
WBC: 7.4 10*3/uL (ref 4.0–10.5)
nRBC: 0 % (ref 0.0–0.2)

## 2020-06-11 LAB — GLUCOSE, CAPILLARY
Glucose-Capillary: 245 mg/dL — ABNORMAL HIGH (ref 70–99)
Glucose-Capillary: 171 mg/dL — ABNORMAL HIGH (ref 70–99)
Glucose-Capillary: 166 mg/dL — ABNORMAL HIGH (ref 70–99)
Glucose-Capillary: 122 mg/dL — ABNORMAL HIGH (ref 70–99)
Glucose-Capillary: 130 mg/dL — ABNORMAL HIGH (ref 70–99)
Glucose-Capillary: 184 mg/dL — ABNORMAL HIGH (ref 70–99)

## 2020-06-11 LAB — BASIC METABOLIC PANEL
Anion gap: 11 (ref 5–15)
BUN: 9 mg/dL (ref 8–23)
CO2: 23 mmol/L (ref 22–32)
Calcium: 8.6 mg/dL — ABNORMAL LOW (ref 8.9–10.3)
Chloride: 93 mmol/L — ABNORMAL LOW (ref 98–111)
Creatinine, Ser: 0.58 mg/dL (ref 0.44–1.00)
GFR, Estimated: >60 mL/min (ref >60)
Glucose, Bld: 234 mg/dL — ABNORMAL HIGH (ref 70–99)
Potassium: 3.9 mmol/L (ref 3.5–5.1)
Sodium: 127 mmol/L — ABNORMAL LOW (ref 135–145)

## 2020-06-11 LAB — BPAM PLATELET PHERESIS
Blood Product Expiration Date: 202203172359
Blood Product Expiration Date: 202203202359
Blood Product Expiration Date: 202203202359
ISSUE DATE / TIME: 202203171723
ISSUE DATE / TIME: 202203171723
ISSUE DATE / TIME: 202203171734
Unit Type and Rh: 5100
Unit Type and Rh: 6200
Unit Type and Rh: 7300

## 2020-06-11 LAB — PREPARE PLATELET PHERESIS
Unit division: 0
Unit division: 0
Unit division: 0

## 2020-06-11 LAB — TRIGLYCERIDES: Triglycerides: 44 mg/dL (ref <150)

## 2020-06-11 MED ORDER — FENTANYL CITRATE (PF) 100 MCG/2ML IJ SOLN: 25.0000 ug | INTRAMUSCULAR | Status: DC | PRN

## 2020-06-11 MED ORDER — ACETAMINOPHEN 325 MG PO TABS: 650.0000 mg | ORAL_TABLET | ORAL | Status: DC | PRN

## 2020-06-11 MED ORDER — CHLORHEXIDINE GLUCONATE 0.12% ORAL RINSE (MEDLINE KIT)
15.0000 mL | Freq: Two times a day (BID) | OROMUCOSAL | Status: DC
  Administered 2020-06-11 – 2020-06-12 (×2): 15 mL via OROMUCOSAL

## 2020-06-11 MED ORDER — SENNOSIDES 8.8 MG/5ML PO SYRP: 5.0000 mL | ORAL_SOLUTION | Freq: Every evening | ORAL | Status: DC | PRN

## 2020-06-11 MED ORDER — ORAL CARE MOUTH RINSE
15.0000 mL | OROMUCOSAL | Status: DC
  Administered 2020-06-11 – 2020-06-12 (×8): 15 mL via OROMUCOSAL

## 2020-06-11 MED ORDER — SODIUM CHLORIDE 0.9 % IV SOLN: INTRAVENOUS | Status: DC

## 2020-06-11 MED ORDER — METHOCARBAMOL 1000 MG/10ML IJ SOLN: 500.0000 mg | Freq: Four times a day (QID) | INTRAVENOUS | Status: DC | PRN

## 2020-06-11 MED ORDER — ONDANSETRON HCL 4 MG/2ML IJ SOLN: 4.0000 mg | Freq: Four times a day (QID) | INTRAMUSCULAR | Status: DC | PRN

## 2020-06-11 MED ORDER — SODIUM CHLORIDE 0.9% FLUSH: 10.0000 mL | INTRAVENOUS | Status: DC | PRN

## 2020-06-11 MED ORDER — VITAL AF 1.2 CAL PO LIQD
1000.0000 mL | ORAL | Status: DC
  Administered 2020-06-11: 1000 mL

## 2020-06-11 MED ORDER — VITAL HIGH PROTEIN PO LIQD: 1000.0000 mL | ORAL | Status: DC

## 2020-06-11 MED ORDER — MIDAZOLAM HCL 2 MG/2ML IJ SOLN: 1.0000 mg | INTRAMUSCULAR | Status: DC | PRN

## 2020-06-11 MED ORDER — HYDROCODONE-ACETAMINOPHEN 7.5-325 MG PO TABS
1.0000 | ORAL_TABLET | ORAL | Status: DC | PRN
Start: 2020-06-11 — End: 2020-06-12
  Administered 2020-06-11 – 2020-06-12 (×2): 1
  Filled 2020-06-11: qty 1

## 2020-06-11 MED ORDER — PROSOURCE TF PO LIQD: 45.0000 mL | Freq: Two times a day (BID) | ORAL | Status: DC

## 2020-06-11 MED ORDER — SODIUM CHLORIDE 0.9% FLUSH
10.0000 mL | Freq: Two times a day (BID) | INTRAVENOUS | Status: DC
  Administered 2020-06-11 – 2020-06-16 (×8): 10 mL

## 2020-06-11 MED ORDER — PANTOPRAZOLE SODIUM 40 MG IV SOLR
40.0000 mg | INTRAVENOUS | Status: DC
  Administered 2020-06-11 – 2020-06-12 (×2): 40 mg via INTRAVENOUS
  Filled 2020-06-11 (×2): qty 40

## 2020-06-11 MED ORDER — METHOCARBAMOL 500 MG PO TABS: 500.0000 mg | ORAL_TABLET | Freq: Four times a day (QID) | ORAL | Status: DC | PRN

## 2020-06-11 MED ORDER — ALUM & MAG HYDROXIDE-SIMETH 200-200-20 MG/5ML PO SUSP: 30.0000 mL | Freq: Four times a day (QID) | ORAL | Status: DC | PRN

## 2020-06-11 MED ORDER — ACETAMINOPHEN 650 MG RE SUPP: 650.0000 mg | RECTAL | Status: DC | PRN

## 2020-06-11 MED ORDER — ONDANSETRON HCL 4 MG PO TABS: 4.0000 mg | ORAL_TABLET | Freq: Four times a day (QID) | ORAL | Status: DC | PRN

## 2020-06-11 MED FILL — Thrombin (Recombinant) For Soln 5000 Unit: CUTANEOUS | Qty: 5000 | Status: AC

## 2020-06-11 MED FILL — Thrombin For Soln Kit 5000 Unit: CUTANEOUS | Qty: 1 | Status: AC

## 2020-06-11 NOTE — Progress Notes (Signed)
Pt did not tolerated PS/CPAP trial. She was placed back on [facility]

## 2020-06-11 NOTE — Progress Notes (Signed)
MD verified RN OGT placement. MD states okay for RN to use for TF. Per RD. Okay to start TF at ordered rate. Rn will start TF and continue to monitor pt and made family at bedside aware.

## 2020-06-11 NOTE — Progress Notes (Signed)
PT Cancellation Note  Patient Details Name: Suzanne Fox MRN: [id number] DOB: [birth year]   Cancelled Treatment:    Reason Eval/Treat Not Completed: Patient not medically ready. Pt remains intubated following yesterday's surgery. Sedation meds d/c'd. Plan for extubation today when pt more alert/awake. PT to follow and proceed with eval as time allows following extubation.   Ilda Foil 06/11/2020, 11:39 AM   Aida Raider, PT  Office # [phone] Pager [phone]

## 2020-06-11 NOTE — Progress Notes (Addendum)
Initial Nutrition Assessment  RD working remotely.  DOCUMENTATION CODES:   Not applicable  INTERVENTION:   Once OG tube placement confirmed, initiate tube feeds: - Vital AF 1.2 @ 40 ml/hr (960 ml/day)  Tube feeding regimen provides 1152 kcal, 72 grams of protein, and 779 ml of H2O.  Tube feeding regimen and current propofol provides 1337 total kcal.  NUTRITION DIAGNOSIS:   Inadequate oral intake related to inability to eat as evidenced by NPO status.  GOAL:   Patient will meet greater than or equal to 90% of their needs  MONITOR:   Vent status,Weight trends,Skin,TF tolerance  REASON FOR ASSESSMENT:   Malnutrition Screening Tool,Ventilator,Consult Enteral/tube feeding initiation and management  ASSESSMENT:   87 year old female who presented to the ED on 3/17 with right shoulder pain and right hemiparesis. PMH of T2DM, HTN, HLD, PVD, dementia. Pt found to have right shoulder mass and MRI consistent with subacromial bursitis. Pt also found to have severe stenosis in the upper cervical spine.   3/17 - s/p C3-6 decompression and fusion  Pt remains intubated post-op. RD consulted for tube feeding initiation and management. Discussed pt with RN. RN about to place OGT then obtaining abdominal x-ray to confirm placement.  Fentanyl and neosynephrine off this morning per [facility] documentation.  Unable to obtain diet and weight history at this time.  Patient is currently intubated on ventilator support MV: 7.4 L/min Temp (24hrs), Avg:94.3 F (34.6 C), Min:90.68 F (32.6 C), Max:98.6 F (37 C)  Drips: Propofol: 7 ml/hr (provides 185 kcal daily from lipid) NS: 50 ml/hr  Medications reviewed and include: SSI q 4 hours, IV protonix  Labs reviewed: sodium 127, hemoglobin 8.6 CBG's: 166-245 x 24 hours  UOP: 3400 ml x 24 hours Hemovac: 50 ml x 24 hours I/O's: -142 ml since admission  NUTRITION - FOCUSED PHYSICAL EXAM:  Unable to complete at this time. RD working  remotely.  Diet Order:   Diet Order            Diet NPO time specified  Diet effective now                 EDUCATION NEEDS:   No education needs have been identified at this time  Skin:  Skin Assessment: Skin Integrity Issues: Incisions: neck Other: skin tear to head  Last BM:  no documented BM  Height:   Ht Readings from Last 1 Encounters:  06/10/20 5' (1.524 m)    Weight:   Wt Readings from Last 1 Encounters:  06/10/20 54.5 kg    BMI:  Body mass index is 23.47 kg/m.  Estimated Nutritional Needs:   Kcal:  1100  Protein:  70-85 grams  Fluid:  >/= 1.2 L    Mertie Clause, MS, RD, LDN Inpatient Clinical Dietitian Please see AMiON for contact information.

## 2020-06-11 NOTE — Progress Notes (Addendum)
eLink Physician-Brief Progress Note Patient Name: Suzanne Fox DOB: 1930/01/18 MRN: [id number]   Date of Service  06/11/2020  HPI/Events of Note  Patient admitted to the ICU following post-op respiratory failure requiring maintenance of intubation and mechanical ventilation, following C3 - C6  Emergent decompression and fusion for spontaneous epidural hematoma with cord compression and severe cervical spinal stenosis.  eICU Interventions  New Patient Evaluation completed. LR KVO fluids discontinued.        Thomasene Lot Suzanne Fox 06/11/2020, 12:11 AM

## 2020-06-11 NOTE — Progress Notes (Signed)
eLink Physician-Brief Progress Note Patient Name: Suzanne Fox DOB: October 22, 1929 MRN: [id number]   Date of Service  06/11/2020  HPI/Events of Note  Agitation - Request to renew Left soft wrist restraint.   eICU Interventions  Will renew Left soft wrist restraint X 11 hours.      Intervention Category Major Interventions: Delirium, psychosis, severe agitation - evaluation and management  Jaye Saal Eugene 06/11/2020, 10:45 PM

## 2020-06-11 NOTE — Plan of Care (Signed)
  Problem: Education: Goal: Knowledge of General Education information will improve Description Including pain rating scale, medication(s)/side effects and non-pharmacologic comfort measures Outcome: Progressing   Problem: Health Behavior/Discharge Planning: Goal: Ability to manage health-related needs will improve Outcome: Progressing   

## 2020-06-11 NOTE — Progress Notes (Signed)
PT Cancellation Note  Patient Details Name: NEGAR SIELER MRN: [id number] DOB: 08-11-1929   Cancelled Treatment:    Reason Eval/Treat Not Completed: Medical issues which prohibited therapy (Unable to extubate pt today. RN asked for PT to return tomorrow.)   Bevelyn Buckles 06/11/2020, 3:05 PM  Beckett Hickmon M,PT Acute Rehab Services [phone] (pager)

## 2020-06-11 NOTE — Progress Notes (Signed)
NAME:  Suzanne Fox, MRN:  [id number], DOB:  Aug 25, 1929, LOS: 0 ADMISSION DATE:  06/10/2020, CONSULTATION DATE:  06/10/2020 REFERRING MD:  Dr. Jake Samples, Neurosurgery, CHIEF COMPLAINT:  Rt sided weakness   History of Present Illness:  87 yo female former smoker presented with Rt shoulder pain and Rt sided weakness.  Found to have spontaneous epidural hematoma with cervical spine cord compression and severe cervical spinal stensosis.  Required emergent C3-C6 decompression and fusion.  Remained on ventilator after surgery and PCCM assumed medical care in ICU.  Pertinent  Medical History  PAD, Hypothyroidism, HTN, HLD, Hiatal hernia, DM type 2, Dementia, Arthritis  Significant Hospital Events:  3/17 Admit, C3-6 decompression with fusion  Interim History / Subjective:  Low RR with SBT.  Remained on sedation overnight.  Objective   Blood pressure (!) 117/59, pulse 60, temperature 98.1 F (36.7 C), temperature source Esophageal, resp. rate 18, height 5' (1.524 m), weight 54.5 kg, SpO2 100 %.    Vent Mode: PRVC FiO2 (%):  [40 %-100 %] 40 % Set Rate:  [18 bmp] 18 bmp Vt Set:  [400 mL] 400 mL PEEP:  [5 cmH20] 5 cmH20 Plateau Pressure:  [12 cmH20-15 cmH20] 15 cmH20   Intake/Output Summary (Last 24 hours) at 06/11/2020 0831 Last data filed at 06/11/2020 3382 Gross per 24 hour  Intake 3498.81 ml  Output 3675 ml  Net -176.19 ml   Filed Weights   06/10/20 0306 06/10/20 2345  Weight: 59.9 kg 54.5 kg    Examination:  General - sedated Eyes - pupils reactive ENT - ETT in place Cardiac - regular rate/rhythm, no murmur Chest - equal breath sounds b/l, no wheezing or rales Abdomen - soft, non tender, + bowel sounds Extremities - no cyanosis, clubbing, or edema Skin - no rashes Neuro - RASS -1, follows commands, Rt side weak, moves Lt side   Labs/imaging personally reviewed    CMP Latest Ref Rng & Units 06/11/2020 06/10/2020 06/10/2020  Glucose 70 - 99 mg/dL 505(L) - 976(B)  BUN 8 - 23  mg/dL 9 - 8  Creatinine 3.41 - 1.00 mg/dL 9.37 - 9.02(I)  Sodium 135 - 145 mmol/L 127(L) 132(L) 130(L)  Potassium 3.5 - 5.1 mmol/L 3.9 2.5(LL) 2.6(LL)  Chloride 98 - 111 mmol/L 93(L) - 89(L)  CO2 22 - 32 mmol/L 23 - -  Calcium 8.9 - 10.3 mg/dL 0.9(B) - -    CBC Latest Ref Rng & Units 06/11/2020 06/10/2020 06/10/2020  WBC 4.0 - 10.5 K/uL 7.4 - -  Hemoglobin 12.0 - 15.0 g/dL 3.5(H) 2.9(J) 2.4(Q)  Hematocrit 36.0 - 46.0 % 25.4(L) 27.0(L) 29.0(L)  Platelets 150 - 400 K/uL 306 - -    ABG    Component Value Date/Time   PHART 7.452 (H) 06/10/2020 2120   PCO2ART 35.8 06/10/2020 2120   PO2ART 305 (H) 06/10/2020 2120   HCO3 26.1 06/10/2020 2120   TCO2 27 06/10/2020 2120   O2SAT 100.0 06/10/2020 2120    CBG (last 3)  Recent Labs    06/10/20 2255 06/11/20 0406 06/11/20 0724  GLUCAP 209* 245* 171*    Resolved Hospital Problem list     Assessment & Plan:   Compromised airway after C spine surgery. - pressure support wean as able - extubation trial once mental status improves off of sedation - f/u CXR intermittently  Epidural hematoma and cervical spinal stenosis causing Rt sided weakness s/p decompression and fusion on 3/17. - post op care per neurosurgery  Hyponatremia. Hypokalemia. - NS IV  fluid - f/u BMET  Anemia of critical illness and chronic disease. - f/u CBC - transfuse for Hb < 7 or significant bleeding  Hx of dementia. - hold outpt nameda  Hx of HTN, HLD. - hold outpt ASA, norvasc, atenolol, chlorthalidone, lipitor, catapres, lisinopril  Hx of hypothyroidism. - hold outpt synthroid  DM type 2 poorly controlled with hyperglycemia. - SSI  Best practice (evaluated daily)  Diet:  NPO Pain/Anxiety/Delirium protocol (if indicated): Yes (RASS goal 0) VAP protocol (if indicated): Yes DVT prophylaxis: SCD GI prophylaxis: PPI Glucose control:  SSI Yes Central venous access:  N/A Arterial line:  N/A Foley:  N/A Mobility:  bed rest  PT consulted:  N/A Last date of multidisciplinary goals of care discussion [Next week] Code Status:  full code Disposition: ICU  Labs    CMP Latest Ref Rng & Units 06/11/2020 06/10/2020 06/10/2020  Glucose 70 - 99 mg/dL 170(Y) - 174(B)  BUN 8 - 23 mg/dL 9 - 8  Creatinine 4.49 - 1.00 mg/dL 6.75 - 9.16(B)  Sodium 135 - 145 mmol/L 127(L) 132(L) 130(L)  Potassium 3.5 - 5.1 mmol/L 3.9 2.5(LL) 2.6(LL)  Chloride 98 - 111 mmol/L 93(L) - 89(L)  CO2 22 - 32 mmol/L 23 - -  Calcium 8.9 - 10.3 mg/dL 8.4(Y) - -    CBC Latest Ref Rng & Units 06/11/2020 06/10/2020 06/10/2020  WBC 4.0 - 10.5 K/uL 7.4 - -  Hemoglobin 12.0 - 15.0 g/dL 6.5(L) 9.3(T) 7.0(V)  Hematocrit 36.0 - 46.0 % 25.4(L) 27.0(L) 29.0(L)  Platelets 150 - 400 K/uL 306 - -    ABG    Component Value Date/Time   PHART 7.452 (H) 06/10/2020 2120   PCO2ART 35.8 06/10/2020 2120   PO2ART 305 (H) 06/10/2020 2120   HCO3 26.1 06/10/2020 2120   TCO2 27 06/10/2020 2120   O2SAT 100.0 06/10/2020 2120    CBG (last 3)  Recent Labs    06/10/20 2255 06/11/20 0406 06/11/20 0724  GLUCAP 209* 245* 171*   Critical care time: 33 minutes  Coralyn Helling, MD [facility]/Critical Care Pager - [phone] 06/11/2020, 8:42 AM

## 2020-06-11 NOTE — Progress Notes (Signed)
   Providing Compassionate, Quality Care - Together  NEUROSURGERY PROGRESS NOTE   S: No issues overnight.  Remains intubated, mildly awake and interactive appropriately  O: EXAM:  BP (!) 117/59 (BP Location: Left Arm)   Pulse 60   Temp 98.1 F (36.7 C) (Esophageal)   Resp 18   Ht 5' (1.524 m)   Wt 54.5 kg   SpO2 100%   BMI 23.47 kg/m   Intubated, not sedated Eyes open spontaneously, PERRL Face symmetric Wound clean dry and intact, Hemovac in place Left arm, leg 4+/5 throughout Right arm 2-3/5, right lower extremity 4/5 throughout  ASSESSMENT:  87 y.o. female with    1.  Cervical stenosis with cord signal change due to spontaneous epidural hematoma, cervical spine  -Status post C2-6 decompression and C3-6 fusion on 06/10/2020  PLAN: -ICU for vent management -Map goals greater than 70 -Monitor Hemovac -Pain control -Hold all anticoagulation at this time, platelets given Intra-Op due to aspirin regimen -SCDs -GI prophylaxis -Extubation per critical care medicine, plan for today -PT/OT -Neurologically significantly improved compared to preop, continue supportive care     Thank you for allowing me to participate in this patient's care.  Please do not hesitate to call with questions or concerns.   Monia Pouch, DO Neurosurgeon [facility] Cell: [phone]

## 2020-06-12 DIAGNOSIS — J988 Other specified respiratory disorders: Secondary | ICD-10-CM | POA: Diagnosis not present

## 2020-06-12 DIAGNOSIS — R509 Fever, unspecified: Secondary | ICD-10-CM | POA: Diagnosis not present

## 2020-06-12 DIAGNOSIS — I621 Nontraumatic extradural hemorrhage: Secondary | ICD-10-CM | POA: Diagnosis not present

## 2020-06-12 LAB — CBC
HCT: 25.5 % — ABNORMAL LOW (ref 36.0–46.0)
Hemoglobin: 8.4 g/dL — ABNORMAL LOW (ref 12.0–15.0)
MCH: 26.9 pg (ref 26.0–34.0)
MCHC: 32.9 g/dL (ref 30.0–36.0)
MCV: 81.7 fL (ref 80.0–100.0)
Platelets: 292 10*3/uL (ref 150–400)
RBC: 3.12 MIL/uL — ABNORMAL LOW (ref 3.87–5.11)
RDW: 13.6 % (ref 11.5–15.5)
WBC: 11.8 10*3/uL — ABNORMAL HIGH (ref 4.0–10.5)
nRBC: 0 % (ref 0.0–0.2)

## 2020-06-12 LAB — GLUCOSE, CAPILLARY
Glucose-Capillary: 108 mg/dL — ABNORMAL HIGH (ref 70–99)
Glucose-Capillary: 214 mg/dL — ABNORMAL HIGH (ref 70–99)
Glucose-Capillary: 224 mg/dL — ABNORMAL HIGH (ref 70–99)
Glucose-Capillary: 247 mg/dL — ABNORMAL HIGH (ref 70–99)
Glucose-Capillary: 252 mg/dL — ABNORMAL HIGH (ref 70–99)
Glucose-Capillary: 98 mg/dL (ref 70–99)

## 2020-06-12 LAB — MAGNESIUM
Magnesium: 1 mg/dL — ABNORMAL LOW (ref 1.7–2.4)
Magnesium: 2.3 mg/dL (ref 1.7–2.4)

## 2020-06-12 LAB — BASIC METABOLIC PANEL
Anion gap: 11 (ref 5–15)
BUN: 17 mg/dL (ref 8–23)
CO2: 22 mmol/L (ref 22–32)
Calcium: 8.2 mg/dL — ABNORMAL LOW (ref 8.9–10.3)
Chloride: 97 mmol/L — ABNORMAL LOW (ref 98–111)
Creatinine, Ser: 0.82 mg/dL (ref 0.44–1.00)
GFR, Estimated: >60 mL/min (ref >60)
Glucose, Bld: 259 mg/dL — ABNORMAL HIGH (ref 70–99)
Potassium: 3 mmol/L — ABNORMAL LOW (ref 3.5–5.1)
Sodium: 130 mmol/L — ABNORMAL LOW (ref 135–145)

## 2020-06-12 LAB — PHOSPHORUS: Phosphorus: 2.3 mg/dL — ABNORMAL LOW (ref 2.5–4.6)

## 2020-06-12 MED ORDER — MAGNESIUM SULFATE 4 GM/100ML IV SOLN
4.0000 g | Freq: Once | INTRAVENOUS | Status: AC
  Administered 2020-06-12: 4 g via INTRAVENOUS
  Filled 2020-06-12: qty 100

## 2020-06-12 MED ORDER — POTASSIUM PHOSPHATES 15 MMOLE/5ML IV SOLN
15.0000 mmol | Freq: Once | INTRAVENOUS | Status: AC
  Administered 2020-06-12: 15 mmol via INTRAVENOUS
  Filled 2020-06-12: qty 5

## 2020-06-12 MED ORDER — ACETAMINOPHEN 650 MG RE SUPP: 650.0000 mg | RECTAL | Status: DC | PRN

## 2020-06-12 MED ORDER — HYDROCODONE-ACETAMINOPHEN 7.5-325 MG PO TABS
1.0000 | ORAL_TABLET | ORAL | Status: DC | PRN
Start: 2020-06-12 — End: 2020-06-18
  Administered 2020-06-12 – 2020-06-18 (×19): 1 via ORAL
  Filled 2020-06-12 (×20): qty 1

## 2020-06-12 MED ORDER — INSULIN DETEMIR 100 UNIT/ML ~~LOC~~ SOLN
10.0000 [IU] | Freq: Every day | SUBCUTANEOUS | Status: DC
  Administered 2020-06-12: 10 [IU] via SUBCUTANEOUS
  Filled 2020-06-12 (×2): qty 0.1

## 2020-06-12 MED ORDER — CHLORHEXIDINE GLUCONATE 0.12 % MT SOLN
15.0000 mL | Freq: Two times a day (BID) | OROMUCOSAL | Status: DC
  Administered 2020-06-12 – 2020-06-18 (×11): 15 mL via OROMUCOSAL
  Filled 2020-06-12 (×9): qty 15

## 2020-06-12 MED ORDER — WHITE PETROLATUM EX OINT: TOPICAL_OINTMENT | CUTANEOUS | Status: DC | PRN

## 2020-06-12 MED ORDER — POTASSIUM CHLORIDE 10 MEQ/50ML IV SOLN
10.0000 meq | INTRAVENOUS | Status: AC
  Administered 2020-06-12 (×4): 10 meq via INTRAVENOUS
  Filled 2020-06-12 (×4): qty 50

## 2020-06-12 MED ORDER — ORAL CARE MOUTH RINSE
15.0000 mL | Freq: Two times a day (BID) | OROMUCOSAL | Status: DC
  Administered 2020-06-12 – 2020-06-17 (×7): 15 mL via OROMUCOSAL

## 2020-06-12 MED ORDER — ACETAMINOPHEN 325 MG PO TABS
650.0000 mg | ORAL_TABLET | ORAL | Status: DC | PRN
  Administered 2020-06-13 – 2020-06-15 (×2): 650 mg via ORAL
  Filled 2020-06-12 (×2): qty 2

## 2020-06-12 MED ORDER — FENTANYL CITRATE (PF) 100 MCG/2ML IJ SOLN
25.0000 ug | INTRAMUSCULAR | Status: DC | PRN
  Administered 2020-06-12 – 2020-06-14 (×2): 25 ug via INTRAVENOUS
  Filled 2020-06-12 (×2): qty 2

## 2020-06-12 MED ORDER — MAGNESIUM SULFATE 2 GM/50ML IV SOLN
2.0000 g | Freq: Once | INTRAVENOUS | Status: AC
  Administered 2020-06-12: 2 g via INTRAVENOUS
  Filled 2020-06-12: qty 50

## 2020-06-12 NOTE — Progress Notes (Signed)
AM K+ 3.0, Magnesium 1.0 and Phosphorus 2.3. Will initiate CCM ELink.electrolyte protocol  Creat 0.82 and GFR > 60 electrolyte protocol.

## 2020-06-12 NOTE — Progress Notes (Signed)
NAME:  Suzanne Fox, MRN:  [id number], DOB:  Apr 14, 1929, LOS: 1 ADMISSION DATE:  06/10/2020, CONSULTATION DATE:  06/10/2020 REFERRING MD:  Dr. Jake Samples, Neurosurgery, CHIEF COMPLAINT:  Rt sided weakness   History of Present Illness:  87 yo female former smoker presented with Rt shoulder pain and Rt sided weakness.  Found to have spontaneous epidural hematoma with cervical spine cord compression and severe cervical spinal stensosis.  Required emergent C3-C6 decompression and fusion.  Remained on ventilator after surgery and PCCM assumed medical care in ICU.  Pertinent  Medical History  PAD, Hypothyroidism, HTN, HLD, Hiatal hernia, DM type 2, Dementia, Arthritis  Significant Hospital Events:  3/17 Admit, C3-6 decompression with fusion 3/19 extubate  Interim History / Subjective:  More alert.  Tolerating SBT.  Tm 101.7  Objective   Blood pressure (!) 104/52, pulse 84, temperature (!) 100.8 F (38.2 C), temperature source Esophageal, resp. rate 17, height 5' (1.524 m), weight 51.4 kg, SpO2 95 %.    Vent Mode: PRVC FiO2 (%):  [40 %] 40 % Set Rate:  [18 bmp] 18 bmp Vt Set:  [400 mL] 400 mL PEEP:  [5 cmH20] 5 cmH20   Intake/Output Summary (Last 24 hours) at 06/12/2020 0815 Last data filed at 06/12/2020 0600 Gross per 24 hour  Intake 1922.89 ml  Output 645 ml  Net 1277.89 ml   Filed Weights   06/10/20 0306 06/10/20 2345 06/12/20 0424  Weight: 59.9 kg 54.5 kg 51.4 kg    Examination:  General - alert Eyes - pupils reactive ENT - ETT in place Cardiac - regular rate/rhythm, no murmur Chest - scattered rhonchi Abdomen - soft, non tender, + bowel sounds Extremities - no cyanosis, clubbing, or edema Skin - no rashes Neuro - follows commands, improved strength on Rt   Labs/imaging personally reviewed    CMP Latest Ref Rng & Units 06/12/2020 06/11/2020 06/10/2020  Glucose 70 - 99 mg/dL 027(O) 536(U) -  BUN 8 - 23 mg/dL 17 9 -  Creatinine 4.40 - 1.00 mg/dL 3.47 4.25 -  Sodium 956 -  145 mmol/L 130(L) 127(L) 132(L)  Potassium 3.5 - 5.1 mmol/L 3.0(L) 3.9 2.5(LL)  Chloride 98 - 111 mmol/L 97(L) 93(L) -  CO2 22 - 32 mmol/L 22 23 -  Calcium 8.9 - 10.3 mg/dL 8.2(L) 8.6(L) -    CBC Latest Ref Rng & Units 06/12/2020 06/11/2020 06/10/2020  WBC 4.0 - 10.5 K/uL 11.8(H) 7.4 -  Hemoglobin 12.0 - 15.0 g/dL 3.8(V) 5.6(E) 3.3(I)  Hematocrit 36.0 - 46.0 % 25.5(L) 25.4(L) 27.0(L)  Platelets 150 - 400 K/uL 292 306 -    ABG    Component Value Date/Time   PHART 7.452 (H) 06/10/2020 2120   PCO2ART 35.8 06/10/2020 2120   PO2ART 305 (H) 06/10/2020 2120   HCO3 26.1 06/10/2020 2120   TCO2 27 06/10/2020 2120   O2SAT 100.0 06/10/2020 2120    CBG (last 3)  Recent Labs    06/11/20 2329 06/12/20 0324 06/12/20 0730  GLUCAP 184* 247* 252[facility] Problem list     Assessment & Plan:   Compromised airway after C spine surgery. - proceed with extubation on 3/19 - goal SpO2 > 92% - bronchial hygiene  Epidural hematoma and cervical spinal stenosis causing Rt sided weakness s/p decompression and fusion on 3/17. - neck drain placed 3/17 - post op care per neurosurgery - keep MAP > 70 per neurosurgery  Fever noted 3/18. - monitor clinically off Abx for now  Hyponatremia.  Hypokalemia, hypophosphatemia, hypomagnesemia. - continue NS IV fluid at 50 ml/hr - f/u electrolytes  Anemia of critical illness and chronic disease. - f/u CBC intermittently - transfuse for Hb < 7 or significant bleeding  Hx of dementia. - hold outpt nameda  Hx of HTN, HLD. - hold outpt ASA, norvasc, atenolol, chlorthalidone, lipitor, catapres, lisinopril  Hx of hypothyroidism. - resume synthroid when able to swallow pills  DM type 2 poorly controlled with hyperglycemia. - tube feeds to be d/c'ed 3/19, so continue moderate SSI scale until she is able to eat and then reassess - add levemir 10 units daily  Best practice (evaluated daily)  Diet:  NPO Pain/Anxiety/Delirium protocol (if  indicated): Yes (RASS goal 0) VAP protocol (if indicated): Yes DVT prophylaxis: SCD GI prophylaxis: PPI Glucose control:  SSI Yes Central venous access:  Keep for now Arterial line:  N/A Foley:  N/A Mobility:  bed rest  PT consulted: N/A Last date of multidisciplinary goals of care discussion [Next week] Code Status:  full code Disposition: ICU  Critical care time: 32 minutes  Coralyn Helling, MD [facility] Pulmonary/Critical Care Pager - [phone] 06/12/2020, 8:15 AM

## 2020-06-12 NOTE — Progress Notes (Signed)
PT Cancellation Note  Patient Details Name: Suzanne Fox MRN: [id number] DOB: 1929/09/04   Cancelled Treatment:    Reason Eval/Treat Not Completed: Medical issues which prohibited therapy  Remains intubated. Neurosurgery note to begin PT once extubated. Will follow.   [name], PT Pager [phone]  [name] 06/12/2020, 8:19 AM

## 2020-06-12 NOTE — Evaluation (Signed)
Clinical/Bedside Swallow Evaluation Patient Details  Name: Suzanne Fox MRN: [id number] Date of Birth: 1929/06/17  Today's Date: 06/12/2020 Time: SLP Start Time (ACUTE ONLY): 1500 SLP Stop Time (ACUTE ONLY): 1534 SLP Time Calculation (min) (ACUTE ONLY): 34 min  Past Medical History:  Past Medical History:  Diagnosis Date  . Arthritis   . Dementia without behavioral disturbance (HCC) 06/10/2020  . Diabetes mellitus   . Heart murmur   . Hiatal hernia   . Hyperlipidemia   . Hypertension   . Hypothyroidism (acquired) 06/10/2020  . Peripheral vascular disease [facility])    Past Surgical History:  Past Surgical History:  Procedure Laterality Date  . ABDOMINAL HYSTERECTOMY    . CHOLECYSTECTOMY    . POSTERIOR CERVICAL FUSION/FORAMINOTOMY N/A 06/10/2020   Procedure: POSTERIOR CERVICAL FUSION/FORAMINOTOMY LEVEL cervical three-cervical six;  Surgeon: Bethann Goo, DO;  Location: [facility] OR;  Service: Neurosurgery;  Laterality: N/A;   HPI:  87 yo female presented with Rt shoulder pain and Rt sided weakness.  Found to have spontaneous epidural hematoma with cervical spine cord compression and severe cervical spinal stensosis. Required emergent C3-posterior cervical decompression and fusion. Remained on ventilator after surgery. Pertinent  Medical History: PAD, hypothyroidism, HTN, HLD, Hiatal hernia, DM type 2, Dementia, Arthritis   Assessment / Plan / Recommendation Clinical Impression  Pt complained of odonophagia following extubation this morning after 2 days for emergent PCDF. Her vocal quality is mildly hoarse and cough effort suspected below her baseline however fair-good considering intubation and age. She consumed straw sips thin, puree and solid with several delayed throat clears and wet vocal quality x 1 over multiple trials. Recommending full liquids due to throat pain with expectation of advancing with ST by Monday and crush meds. SLP Visit Diagnosis: Dysphagia, unspecified (R13.10)     Aspiration Risk  Mild aspiration risk    Diet Recommendation Thin liquid (full liquids)   Liquid Administration via: Cup;Straw Medication Administration: Crushed with puree Supervision: Staff to assist with self feeding;Full supervision/cueing for compensatory strategies Postural Changes: Seated upright at 90 degrees    Other  Recommendations Oral Care Recommendations: Oral care BID   Follow up Recommendations None      Frequency and Duration min 2x/week  2 weeks       Prognosis Prognosis for Safe Diet Advancement: Good      Swallow Study   General HPI: 87 yo female presented with Rt shoulder pain and Rt sided weakness.  Found to have spontaneous epidural hematoma with cervical spine cord compression and severe cervical spinal stensosis. Required emergent C3-posterior cervical decompression and fusion. Remained on ventilator after surgery. Pertinent  Medical History: PAD, hypothyroidism, HTN, HLD, Hiatal hernia, DM type 2, Dementia, Arthritis Type of Study: Bedside Swallow Evaluation Previous Swallow Assessment: none Diet Prior to this Study: NPO Temperature Spikes Noted: No Respiratory Status: Nasal cannula History of Recent Intubation: Yes Length of Intubations (days): 2 days Date extubated: 06/12/20 (8:30 am) Behavior/Cognition: Alert;Cooperative;Pleasant mood Oral Cavity Assessment: Within Functional Limits (candidia) Oral Care Completed by SLP: Recent completion by staff Oral Cavity - Dentition: Dentures, top (no lower dentition or denture) Vision: Functional for self-feeding Self-Feeding Abilities: Needs assist Patient Positioning: Upright in bed Baseline Vocal Quality: Hoarse Volitional Cough:  (mildly weak) Volitional Swallow: Able to elicit    Oral/Motor/Sensory Function Overall Oral Motor/Sensory Function: Within functional limits   Ice Chips Ice chips: Not tested   Thin Liquid Thin Liquid: Impaired Presentation: Cup;Straw;Spoon Oral Phase Impairments:   (none) Oral Phase Functional Implications:  (  none) Pharyngeal  Phase Impairments: Throat Clearing - Delayed;Wet Vocal Quality    Nectar Thick Nectar Thick Liquid: Not tested   Honey Thick Honey Thick Liquid: Not tested   Puree Puree: Within functional limits Presentation: Spoon;Self Fed   Solid     Solid: Within functional limits      Royce Macadamia 06/12/2020,5:09 PM Breck Coons Lonell Face.Ed Nurse, children's [phone] Office [phone]

## 2020-06-12 NOTE — Progress Notes (Signed)
   Providing Compassionate, Quality Care - Together  NEUROSURGERY PROGRESS NOTE   S: No issues overnight. Denies neck pain  O: EXAM:  BP (!) 104/52   Pulse 84   Temp (!) 100.8 F (38.2 C) (Esophageal)   Resp 17   Ht 5' (1.524 m)   Wt 51.4 kg   SpO2 95%   BMI 22.13 kg/m   Intubated, not sedated Eyes open spontaneously, PERRL Face symmetric Wound clean dry and intact, Hemovac in place Left arm, leg 4+/5 throughout Right arm 1-2/5, right lower extremity 2/5   ASSESSMENT:  87 y.o. female with    1.Cervical stenosis with cord signal change due to spontaneous epidural hematoma, cervical spine  -Status post C2-6 decompression and C3-6 fusion on 06/10/2020  PLAN: -ICU for vent management -Map goals greater than 70 -Monitor Hemovac, will leave in as it put out 60 cc overnight, likely DC tomorrow -Pain control -Hold all anticoagulation at this time, platelets given Intra-Op due to aspirin regimen -SCDs -GI prophylaxis -Extubation per critical care medicine, plan for today -PT/OT once extubated -Neurologically improved compared to preop, continue supportive care    Thank you for allowing me to participate in this patient's care.  Please do not hesitate to call with questions or concerns.   Monia Pouch, DO Neurosurgeon [facility] Cell: [phone]

## 2020-06-12 NOTE — Procedures (Signed)
Extubation Procedure Note  Patient Details:   Name: SHAMIRAH IVAN DOB: 1929/07/22 MRN: [id number]   Airway Documentation:    Vent end date: 06/12/20 Vent end time: 0835   Evaluation  O2 sats: stable throughout Complications: No apparent complications Patient did tolerate procedure well. Bilateral Breath Sounds: Diminished   Yes, pt could speak post extubation.  Pt extubated to 4 l/m [place] per physcian order.  Audrie Lia 06/12/2020, 8:36 AM

## 2020-06-12 NOTE — Progress Notes (Signed)
  Speech Language Pathology  Patient Details Name: DEKLYNN CHARLET MRN: [id number] DOB: 04/11/1929 Today's Date: 06/12/2020 Time:  -     Order received for swallow assessment. Extubated 8:30 this am. SLP will plan to check this afternoon. Suspect dysphagia following compromised airway per MD after s/p decompression and fusion on 3/17.                              Royce Macadamia 06/12/2020, 10:28 AM  Breck Coons Lonell Face.Ed Nurse, children's [phone] Office [phone]  '

## 2020-06-12 NOTE — Progress Notes (Signed)
OT Cancellation Note  Patient Details Name: Suzanne Fox MRN: [id number] DOB: November 05, 1929   Cancelled Treatment:    Reason Eval/Treat Not Completed: Patient not medically ready (Pt remains intubated and sedated, will continue to follow.)  Evern Bio 06/12/2020, 7:44 AM  Martie Round, OTR/L Acute Rehabilitation Services Pager: [phone] Office: [phone]

## 2020-06-13 DIAGNOSIS — I10 Essential (primary) hypertension: Secondary | ICD-10-CM

## 2020-06-13 DIAGNOSIS — F0390 Unspecified dementia without behavioral disturbance: Secondary | ICD-10-CM | POA: Diagnosis not present

## 2020-06-13 DIAGNOSIS — E782 Mixed hyperlipidemia: Secondary | ICD-10-CM

## 2020-06-13 DIAGNOSIS — E039 Hypothyroidism, unspecified: Secondary | ICD-10-CM

## 2020-06-13 DIAGNOSIS — I621 Nontraumatic extradural hemorrhage: Secondary | ICD-10-CM | POA: Diagnosis not present

## 2020-06-13 LAB — BASIC METABOLIC PANEL
Anion gap: 4 — ABNORMAL LOW (ref 5–15)
Anion gap: 3 — ABNORMAL LOW (ref 5–15)
BUN: 8 mg/dL (ref 8–23)
BUN: 7 mg/dL — ABNORMAL LOW (ref 8–23)
CO2: 26 mmol/L (ref 22–32)
CO2: 26 mmol/L (ref 22–32)
Calcium: 8.2 mg/dL — ABNORMAL LOW (ref 8.9–10.3)
Calcium: 8.1 mg/dL — ABNORMAL LOW (ref 8.9–10.3)
Chloride: 99 mmol/L (ref 98–111)
Chloride: 99 mmol/L (ref 98–111)
Creatinine, Ser: 0.5 mg/dL (ref 0.44–1.00)
Creatinine, Ser: 0.45 mg/dL (ref 0.44–1.00)
GFR, Estimated: >60 mL/min (ref >60)
GFR, Estimated: >60 mL/min (ref >60)
Glucose, Bld: 83 mg/dL (ref 70–99)
Glucose, Bld: 142 mg/dL — ABNORMAL HIGH (ref 70–99)
Potassium: 2.8 mmol/L — ABNORMAL LOW (ref 3.5–5.1)
Potassium: 3.9 mmol/L (ref 3.5–5.1)
Sodium: 129 mmol/L — ABNORMAL LOW (ref 135–145)
Sodium: 128 mmol/L — ABNORMAL LOW (ref 135–145)

## 2020-06-13 LAB — GLUCOSE, CAPILLARY
Glucose-Capillary: 88 mg/dL (ref 70–99)
Glucose-Capillary: 98 mg/dL (ref 70–99)
Glucose-Capillary: 69 mg/dL — ABNORMAL LOW (ref 70–99)
Glucose-Capillary: 119 mg/dL — ABNORMAL HIGH (ref 70–99)
Glucose-Capillary: 248 mg/dL — ABNORMAL HIGH (ref 70–99)
Glucose-Capillary: 167 mg/dL — ABNORMAL HIGH (ref 70–99)

## 2020-06-13 LAB — PHOSPHORUS: Phosphorus: 2.4 mg/dL — ABNORMAL LOW (ref 2.5–4.6)

## 2020-06-13 LAB — MAGNESIUM: Magnesium: 2.1 mg/dL (ref 1.7–2.4)

## 2020-06-13 MED ORDER — ONDANSETRON HCL 4 MG/2ML IJ SOLN: 4.0000 mg | Freq: Four times a day (QID) | INTRAMUSCULAR | Status: DC | PRN

## 2020-06-13 MED ORDER — POTASSIUM & SODIUM PHOSPHATES 280-160-250 MG PO PACK
1.0000 | PACK | Freq: Three times a day (TID) | ORAL | Status: AC
  Administered 2020-06-13 – 2020-06-14 (×3): 1 via ORAL
  Filled 2020-06-13 (×3): qty 1

## 2020-06-13 MED ORDER — ATENOLOL 25 MG PO TABS
25.0000 mg | ORAL_TABLET | Freq: Every day | ORAL | Status: DC
  Administered 2020-06-13 – 2020-06-18 (×6): 25 mg via ORAL
  Filled 2020-06-13 (×7): qty 1

## 2020-06-13 MED ORDER — POTASSIUM CHLORIDE CRYS ER 20 MEQ PO TBCR
40.0000 meq | EXTENDED_RELEASE_TABLET | Freq: Once | ORAL | Status: AC
  Administered 2020-06-13: 40 meq via ORAL
  Filled 2020-06-13: qty 2

## 2020-06-13 MED ORDER — LEVOTHYROXINE SODIUM 25 MCG PO TABS
25.0000 ug | ORAL_TABLET | Freq: Every day | ORAL | Status: DC
  Administered 2020-06-13 – 2020-06-18 (×6): 25 ug via ORAL
  Filled 2020-06-13 (×6): qty 1

## 2020-06-13 MED ORDER — METHOCARBAMOL 500 MG PO TABS
500.0000 mg | ORAL_TABLET | Freq: Four times a day (QID) | ORAL | Status: DC | PRN
  Administered 2020-06-13 – 2020-06-14 (×2): 500 mg via ORAL
  Filled 2020-06-13 (×2): qty 1

## 2020-06-13 MED ORDER — ALUM & MAG HYDROXIDE-SIMETH 200-200-20 MG/5ML PO SUSP: 30.0000 mL | Freq: Four times a day (QID) | ORAL | Status: DC | PRN

## 2020-06-13 MED ORDER — INSULIN ASPART 100 UNIT/ML ~~LOC~~ SOLN
0.0000 [IU] | Freq: Every day | SUBCUTANEOUS | Status: DC
  Administered 2020-06-14 – 2020-06-17 (×4): 2 [IU] via SUBCUTANEOUS

## 2020-06-13 MED ORDER — INSULIN ASPART 100 UNIT/ML ~~LOC~~ SOLN
0.0000 [IU] | Freq: Three times a day (TID) | SUBCUTANEOUS | Status: DC
  Administered 2020-06-13: 5 [IU] via SUBCUTANEOUS
  Administered 2020-06-14: 2 [IU] via SUBCUTANEOUS
  Administered 2020-06-14 (×2): 5 [IU] via SUBCUTANEOUS
  Administered 2020-06-15: 3 [IU] via SUBCUTANEOUS
  Administered 2020-06-15: 2 [IU] via SUBCUTANEOUS
  Administered 2020-06-16: 11 [IU] via SUBCUTANEOUS
  Administered 2020-06-16: 3 [IU] via SUBCUTANEOUS
  Administered 2020-06-16: 5 [IU] via SUBCUTANEOUS
  Administered 2020-06-17: 2 [IU] via SUBCUTANEOUS
  Administered 2020-06-17: 3 [IU] via SUBCUTANEOUS
  Administered 2020-06-17 – 2020-06-18 (×2): 2 [IU] via SUBCUTANEOUS

## 2020-06-13 MED ORDER — ATORVASTATIN CALCIUM 40 MG PO TABS
40.0000 mg | ORAL_TABLET | Freq: Every day | ORAL | Status: DC
  Administered 2020-06-13 – 2020-06-18 (×6): 40 mg via ORAL
  Filled 2020-06-13 (×6): qty 1

## 2020-06-13 MED ORDER — POTASSIUM CHLORIDE 10 MEQ/50ML IV SOLN
10.0000 meq | INTRAVENOUS | Status: AC
Start: 2020-06-13 — End: 2020-06-13
  Administered 2020-06-13 (×4): 10 meq via INTRAVENOUS
  Filled 2020-06-13 (×2): qty 50

## 2020-06-13 MED ORDER — MEMANTINE HCL 10 MG PO TABS
10.0000 mg | ORAL_TABLET | Freq: Every morning | ORAL | Status: DC
  Administered 2020-06-13 – 2020-06-18 (×6): 10 mg via ORAL
  Filled 2020-06-13 (×7): qty 1

## 2020-06-13 MED ORDER — AMLODIPINE BESYLATE 10 MG PO TABS
10.0000 mg | ORAL_TABLET | Freq: Every day | ORAL | Status: DC
  Administered 2020-06-13 – 2020-06-18 (×6): 10 mg via ORAL
  Filled 2020-06-13 (×6): qty 1

## 2020-06-13 MED ORDER — ONDANSETRON HCL 4 MG PO TABS: 4.0000 mg | ORAL_TABLET | Freq: Four times a day (QID) | ORAL | Status: DC | PRN

## 2020-06-13 MED ORDER — METHOCARBAMOL 1000 MG/10ML IJ SOLN: 500.0000 mg | Freq: Four times a day (QID) | INTRAVENOUS | Status: DC | PRN

## 2020-06-13 MED ORDER — INSULIN DETEMIR 100 UNIT/ML ~~LOC~~ SOLN
5.0000 [IU] | Freq: Every day | SUBCUTANEOUS | Status: DC
  Administered 2020-06-13 – 2020-06-18 (×6): 5 [IU] via SUBCUTANEOUS
  Filled 2020-06-13 (×7): qty 0.05

## 2020-06-13 NOTE — Consult Note (Signed)
Physical Medicine and Rehabilitation Consult Reason for Consult: Right side weakness Referring Physician: Dr. Craige CottaSood   HPI: Suzanne Fox is a 87 y.o. right-handed female with history of peripheral vascular these hypertension, hyperlipidemia, dementia maintained on Namenda, diabetes mellitus, quit smoking 15 years ago.  Per chart review independent prior to admission with an assistive device and still driving.  Patient lives with her sister.  1 level home 3 steps to entry.  Presented 06/10/2020 with acute onset of right-sided weakness as well as shoulder pain.  She denied any recent trauma or fall.  MRI of the brain showed no acute intracranial findings.  Brain involution that had progressed from 2015.  Cervical spine degeneration with multilevel upper cord impingement.  CT/MRI cervical spine showed severe cervical stenosis as well as ventral spontaneous epidural hematoma with cord signal change.  Patient underwent posterior cervical arthrodesis, lateral mass instrumentation, segmental C3-4, 4-5, C5-6 posterior cervical bilateral laminectomy with partial decompression.  Right C4, C5 partial facetectomy for decompression and attempted evacuation of ventral epidural hematoma 06/10/2020 per Dr. Kendell Baneroy Dawley.  Postoperatively extubated 06/12/2020.  Cervical brace at all times.  Hospital course hyponatremia 128-129 and monitored.  Currently on a full liquid diet.  Therapy evaluations completed due to patient's right side weakness recommendations of physical medicine rehab consult.  Patient lives with her twin sister who can provide supervision but no physical assistance.  There is a niece that helps both sisters but does not live in the home.  Review of Systems  Constitutional: Negative for chills and fever.  HENT: Negative for hearing loss.   Eyes: Negative for blurred vision and double vision.  Respiratory: Negative for cough and shortness of breath.   Cardiovascular: Negative for chest pain and  palpitations.  Gastrointestinal: Negative for heartburn and nausea.  Genitourinary: Negative for dysuria, flank pain and hematuria.  Musculoskeletal: Positive for joint pain and myalgias.  Skin: Negative for rash.  Neurological: Positive for weakness.  Psychiatric/Behavioral: Positive for memory loss.  All other systems reviewed and are negative.  Past Medical History:  Diagnosis Date  . Arthritis   . Dementia without behavioral disturbance (HCC) 06/10/2020  . Diabetes mellitus   . Heart murmur   . Hiatal hernia   . Hyperlipidemia   . Hypertension   . Hypothyroidism (acquired) 06/10/2020  . Peripheral vascular disease [facility](HCC)    Past Surgical History:  Procedure Laterality Date  . ABDOMINAL HYSTERECTOMY    . CHOLECYSTECTOMY    . POSTERIOR CERVICAL FUSION/FORAMINOTOMY N/A 06/10/2020   Procedure: POSTERIOR CERVICAL FUSION/FORAMINOTOMY LEVEL cervical three-cervical six;  Surgeon: Bethann Gooawley, Troy C, DO;  Location: [facility] OR;  Service: Neurosurgery;  Laterality: N/A;   Family History  Problem Relation Age of Onset  . Other Mother        At childbirth  . Prostate cancer Father   . Cancer Maternal Aunt   . Asthma Sister   . Diabetes Sister   . Diabetes Sister   . Diabetes Sister    Social History:  reports that she quit smoking about 15 years ago. Her smoking use included cigarettes. She has a 50.00 pack-year smoking history. She has never used smokeless tobacco. She reports that she does not drink alcohol and does not use drugs. Allergies:  Allergies  Allergen Reactions  . Aricept [Donepezil]     Other reaction(s): localised rash  . Other Other (See Comments)   Medications Prior to Admission  Medication Sig Dispense Refill  . amLODipine (NORVASC) 10 MG  tablet Take 10 mg by mouth daily.    Marland Kitchen aspirin EC 81 MG tablet Take 81 mg by mouth daily.    Marland Kitchen atenolol-chlorthalidone (TENORETIC) 50-25 MG per tablet Take 1 tablet by mouth daily.    Marland Kitchen atorvastatin (LIPITOR) 40 MG tablet Take 40 mg  by mouth daily.    Marland Kitchen augmented betamethasone dipropionate (DIPROLENE-AF) 0.05 % ointment Apply 1 application topically daily as needed (rash).    . cloNIDine (CATAPRES) 0.1 MG tablet Take 0.1 mg by mouth 2 (two) times daily.    Marland Kitchen levothyroxine (SYNTHROID, LEVOTHROID) 25 MCG tablet Take 25 mcg by mouth daily.     Marland Kitchen lisinopril (PRINIVIL,ZESTRIL) 40 MG tablet Take 80 mg by mouth daily.     . memantine (NAMENDA) 10 MG tablet Take 10 mg by mouth every morning.      Home: Home Living Family/patient expects to be discharged to:: Unsure Living Arrangements: Other relatives (sister) Available Help at Discharge: Family,Available 24 hours/day Type of Home: House Home Access: Stairs to enter [employer] of Steps: 3 Entrance Stairs-Rails: None Home Layout: One level Home Equipment: None Additional Comments: may have equipment she can borrow  Functional History: Prior Function Level of Independence: Independent Comments: including driving Functional Status:  Mobility: Bed Mobility Overal bed mobility: Needs Assistance Bed Mobility: Supine to Sit,Sit to Supine Supine to sit: Max assist,HOB elevated,+2 for physical assistance Sit to supine: Total assist,+2 for physical assistance General bed mobility comments: Due to bil UE weakness, decided to fully elevate HOB and pivot to sit at EOB to minimize pain Transfers Overall transfer level: Needs assistance Equipment used:  (bed pad) Transfers: Lateral/Scoot Transfers General transfer comment: pt unable to hold her head up and legs very weak; attempted lateral scoot to her right with pt unable to clear her hips/buttocks off of mattress to assist. Ambulation/Gait General Gait Details: unable to stand    ADL:    Cognition: Cognition Overall Cognitive Status: Within Functional Limits for tasks assessed Orientation Level: Oriented to person,Oriented to place Cognition Arousal/Alertness: Awake/alert Behavior During Therapy: WFL for  tasks assessed/performed Overall Cognitive Status: Within Functional Limits for tasks assessed  Blood pressure (!) 154/69, pulse 93, temperature 97.7 F (36.5 C), temperature source Axillary, resp. rate 17, height 5' (1.524 m), weight 52.5 kg, SpO2 99 %. Physical Exam Constitutional:      Appearance: She is normal weight.  HENT:     Head: Normocephalic and atraumatic.  Eyes:     Extraocular Movements: Extraocular movements intact.     Conjunctiva/sclera: Conjunctivae normal.     Pupils: Pupils are equal, round, and reactive to light.  Neck:     Comments: Reduced cervical spine range of motion Cardiovascular:     Rate and Rhythm: Normal rate and regular rhythm.     Heart sounds: Normal heart sounds.  Pulmonary:     Effort: Pulmonary effort is normal. No respiratory distress.     Breath sounds: Normal breath sounds. No wheezing.  Abdominal:     General: Abdomen is flat. Bowel sounds are normal. There is no distension.     Palpations: Abdomen is soft. There is no mass.  Musculoskeletal:     Comments: No pain with upper limb or lower limb range of motion.  Reduced cervical spine range of motion No joint swelling hands wrists, feet or ankles  Skin:    General: Skin is warm and dry.  Neurological:     Mental Status: She is alert and oriented to person, place, and  time.     Sensory: No sensory deficit.     Motor: Weakness and abnormal muscle tone present.     Coordination: Coordination abnormal.     Gait: Gait abnormal.     Comments: Patient is alert in no acute distress.  Cervical collar in place.  Oriented x3 and follows commands.  Reduced tone right upper extremity 3+ patellar reflex bilaterally, 0 at the ankles.  Motor strength is 4/5 in the left deltoid bicep tricep grip to minus at the left hip flexor, knee extensor 3 - at the ankle dorsiflexor and plantar flexor 0/5 in the right deltoid bicep tricep grip 0/5 in the right hip flexor knee extensor ankle dorsiflexor Normal  light touch sensation bilateral upper and lower limbs  Psychiatric:        Mood and Affect: Mood normal.        Behavior: Behavior normal.     Results for orders placed or performed during the hospital encounter of 06/10/20 (from the past 24 hour(s))  Glucose, capillary     Status: Abnormal   Collection Time: 06/12/20  7:17 PM  Result Value Ref Range   Glucose-Capillary 224 (H) 70 - 99 mg/dL  magnesium level     Status: None   Collection Time: 06/12/20  7:42 PM  Result Value Ref Range   Magnesium 2.3 1.7 - 2.4 mg/dL  Glucose, capillary     Status: None   Collection Time: 06/12/20 11:31 PM  Result Value Ref Range   Glucose-Capillary 98 70 - 99 mg/dL  Glucose, capillary     Status: None   Collection Time: 06/13/20  3:31 AM  Result Value Ref Range   Glucose-Capillary 88 70 - 99 mg/dL  Basic metabolic panel     Status: Abnormal   Collection Time: 06/13/20  3:59 AM  Result Value Ref Range   Sodium 129 (L) 135 - 145 mmol/L   Potassium 2.8 (L) 3.5 - 5.1 mmol/L   Chloride 99 98 - 111 mmol/L   CO2 26 22 - 32 mmol/L   Glucose, Bld 83 70 - 99 mg/dL   BUN 8 8 - 23 mg/dL   Creatinine, Ser 2.20 0.44 - 1.00 mg/dL   Calcium 8.2 (L) 8.9 - 10.3 mg/dL   GFR, Estimated >25 >42 mL/min   Anion gap 4 (L) 5 - 15  Magnesium     Status: None   Collection Time: 06/13/20  3:59 AM  Result Value Ref Range   Magnesium 2.1 1.7 - 2.4 mg/dL  Phosphorus     Status: Abnormal   Collection Time: 06/13/20  3:59 AM  Result Value Ref Range   Phosphorus 2.4 (L) 2.5 - 4.6 mg/dL  Glucose, capillary     Status: None   Collection Time: 06/13/20  7:17 AM  Result Value Ref Range   Glucose-Capillary 98 70 - 99 mg/dL  Basic metabolic panel     Status: Abnormal   Collection Time: 06/13/20  1:07 PM  Result Value Ref Range   Sodium 128 (L) 135 - 145 mmol/L   Potassium 3.9 3.5 - 5.1 mmol/L   Chloride 99 98 - 111 mmol/L   CO2 26 22 - 32 mmol/L   Glucose, Bld 142 (H) 70 - 99 mg/dL   BUN 7 (L) 8 - 23 mg/dL    Creatinine, Ser 7.06 0.44 - 1.00 mg/dL   Calcium 8.1 (L) 8.9 - 10.3 mg/dL   GFR, Estimated >23 >76 mL/min   Anion gap 3 (L) 5 -  15  Glucose, capillary     Status: Abnormal   Collection Time: 06/13/20  3:27 PM  Result Value Ref Range   Glucose-Capillary 69 (L) 70 - 99 mg/dL  Glucose, capillary     Status: Abnormal   Collection Time: 06/13/20  4:02 PM  Result Value Ref Range   Glucose-Capillary 119 (H) 70 - 99 mg/dL   No results found.   Assessment/Plan: Diagnosis: Cervical spinal stenosis, epidural hematoma with right-sided plegia and left lower limb paresis 1. Does the need for close, 24 hr/day medical supervision in concert with the patient's rehab needs make it unreasonable for this patient to be served in a less intensive setting? Potentially 2. Co-Morbidities requiring supervision/potential complications: Urinary retention, electrolyte disturbance, diabetes, 3. Due to bladder management, bowel management, safety, skin/wound care, disease management, medication administration, pain management and patient education, does the patient require 24 hr/day rehab nursing? Yes 4. Does the patient require coordinated care of a physician, rehab nurse, therapy disciplines of PT, OT  to address physical and functional deficits in the context of the above medical diagnosis(es)? Yes Addressing deficits in the following areas: balance, endurance, locomotion, strength, transferring, bowel/bladder control, bathing, dressing, feeding, grooming, toileting, cognition and psychosocial support 5. Can the patient actively participate in an intensive therapy program of at least 3 hrs of therapy per day at least 5 days per week? No 6. The potential for patient to make measurable gains while on inpatient rehab is poor 7. Anticipated functional outcomes upon discharge from inpatient rehab are mod assist  with PT, mod assist with OT, n/a with SLP. 8. Estimated rehab length of stay to reach the above functional goals  is: NA 9. Anticipated discharge destination: Other 10. Overall Rehab/Functional Prognosis: poor  RECOMMENDATIONS: This patient's condition is appropriate for continued rehabilitative care in the following setting: SNF Patient has agreed to participate in recommended program. N/A Note that insurance prior authorization may be required for reimbursement for recommended care.  Comment: Do not feel patient would tolerate or progress adequately in an intensive rehab program   Charlton Amor, PA-C 06/13/2020   "I have personally performed a face to face diagnostic evaluation of this patient.  Additionally, I have reviewed and concur with the physician assistant's documentation above." Erick Colace M.D. [facility] Fellow Am Acad of [employer] Med and Rehab Diplomate Am Board of Electrodiagnostic Med Fellow Am Board of Interventional Pain

## 2020-06-13 NOTE — Evaluation (Signed)
Physical Therapy Evaluation Patient Details Name: Suzanne Fox MRN: [id number] DOB: [birth year] Today's Date: 06/13/2020   History of Present Illness  87 yo female former smoker presented 06/10/20 with Rt shoulder pain and Rt sided weakness.  Found to have spontaneous epidural hematoma with cervical spine cord compression and severe cervical spinal stensosis.  Required emergent C3-C6 decompression and fusion.  Remained on ventilator after surgery with extubation 06/12/20. PMH-PAD, Hypothyroidism, HTN, HLD, Hiatal hernia, DM type 2, Dementia, Arthritis  Clinical Impression   Pt admitted with above diagnosis. Patient was very independent PTA, including driving. She currently requires +2 max to total assist for all mobility. She lives with her sister and (per pt report) sister can only provide supervision. Pt highly motivated. Pt currently with functional limitations due to the deficits listed below (see PT Problem List). Pt will benefit from skilled PT to increase their independence and safety with mobility to allow discharge to the venue listed below.       Follow Up Recommendations CIR    Equipment Recommendations  Wheelchair (measurements PT);Hospital bed    Recommendations for Other Services Rehab consult     Precautions / Restrictions Precautions Precautions: Fall;Cervical Precaution Booklet Issued: No Required Braces or Orthoses: Cervical Brace Cervical Brace: Hard collar;At all times      Mobility  Bed Mobility Overal bed mobility: Needs Assistance Bed Mobility: Supine to Sit;Sit to Supine     Supine to sit: Max assist;HOB elevated;+2 for physical assistance Sit to supine: Total assist;+2 for physical assistance   General bed mobility comments: Due to bil UE weakness, decided to fully elevate HOB and pivot to sit at EOB to minimize pain    Transfers Overall transfer level: Needs assistance Equipment used:  (bed pad) Transfers: Lateral/Scoot Transfers            General transfer comment: pt unable to hold her head up and legs very weak; attempted lateral scoot to her right with pt unable to clear her hips/buttocks off of mattress to assist.  Ambulation/Gait             General Gait Details: unable to stand  Stairs            Wheelchair Mobility    Modified Rankin (Stroke Patients Only)       Balance Overall balance assessment: Needs assistance Sitting-balance support: Bilateral upper extremity supported;Feet supported Sitting balance-Leahy Scale: Poor Sitting balance - Comments: leans posteriorly and to her right                                     Pertinent Vitals/Pain Pain Assessment: Faces Faces Pain Scale: Hurts little more Pain Location: rt shoulder with any RUE movment Pain Descriptors / Indicators: Burning;Discomfort Pain Intervention(s): Limited activity within patient's tolerance;Monitored during session;Repositioned    Home Living Family/patient expects to be discharged to:: Unsure Living Arrangements: Other relatives (sister) Available Help at Discharge: Family;Available 24 hours/day Type of Home: House Home Access: Stairs to enter Entrance Stairs-Rails: None Entrance Stairs-Number of Steps: 3 Home Layout: One level Home Equipment: None Additional Comments: may have equipment she can borrow    Prior Function Level of Independence: Independent         Comments: including driving     Hand Dominance        Extremity/Trunk Assessment   Upper Extremity Assessment Upper Extremity Assessment: Defer to OT evaluation    Lower Extremity Assessment  Lower Extremity Assessment: RLE deficits/detail;LLE deficits/detail RLE Deficits / Details: hip flexion 2+, hip/knee extension combined 2+, ankle DF 2+ LLE Deficits / Details: hip flexion 3, hip ext/knee extension 3+, ankle DF 4    Cervical / Trunk Assessment Cervical / Trunk Assessment: Other exceptions Cervical / Trunk Exceptions:  s/p neck surgery; head hanging in upright sitting and reports too heavy for her to hold upright  Communication   Communication: No difficulties  Cognition Arousal/Alertness: Awake/alert Behavior During Therapy: WFL for tasks assessed/performed Overall Cognitive Status: Within Functional Limits for tasks assessed                                        General Comments      Exercises     Assessment/Plan    PT Assessment Patient needs continued PT services  PT Problem List Decreased strength;Decreased activity tolerance;Decreased mobility;Decreased balance;Decreased knowledge of use of DME;Decreased knowledge of precautions;Pain       PT Treatment Interventions DME instruction;Gait training;Functional mobility training;Therapeutic activities;Therapeutic exercise;Balance training;Neuromuscular re-education;Patient/family education    PT Goals (Current goals can be found in the Care Plan section)  Acute Rehab PT Goals Patient Stated Goal: regain her strength and go home PT Goal Formulation: With patient Time For Goal Achievement: 06/27/20 Potential to Achieve Goals: Good    Frequency Min 4X/week   Barriers to discharge Decreased caregiver support;Inaccessible home environment steps to enter home without rail; ?abiltiy of sister to provide more than supervision    Co-evaluation               AM-PAC PT "6 Clicks" Mobility  Outcome Measure Help needed turning from your back to your side while in a flat bed without using bedrails?: Total Help needed moving from lying on your back to sitting on the side of a flat bed without using bedrails?: Total Help needed moving to and from a bed to a chair (including a wheelchair)?: Total Help needed standing up from a chair using your arms (e.g., wheelchair or bedside chair)?: Total Help needed to walk in hospital room?: Total Help needed climbing 3-5 steps with a railing? : Total 6 Click Score: 6    End of Session  Equipment Utilized During Treatment: Gait belt;Oxygen Activity Tolerance: Patient limited by fatigue Patient left: in bed;with call bell/phone within reach Nurse Communication: Mobility status PT Visit Diagnosis: Muscle weakness (generalized) (M62.81);Difficulty in walking, not elsewhere classified (R26.2);Other symptoms and signs involving the nervous system (M01.027)    Time: 2536-6440 PT Time Calculation (min) (ACUTE ONLY): 21 min   Charges:   PT Evaluation $PT Eval Moderate Complexity: 1 Mod           [name], PT Pager [phone]   [name] 06/13/2020, 2:29 PM

## 2020-06-13 NOTE — Progress Notes (Signed)
NAME:  Suzanne Fox, MRN:  [id number], DOB:  10-17-1929, LOS: 2 ADMISSION DATE:  06/10/2020, CONSULTATION DATE:  06/10/2020 REFERRING MD:  Dr. Jake Samples, Neurosurgery, CHIEF COMPLAINT:  Rt sided weakness   History of Present Illness:  87 yo female former smoker presented with Rt shoulder pain and Rt sided weakness.  Found to have spontaneous epidural hematoma with cervical spine cord compression and severe cervical spinal stensosis.  Required emergent C3-C6 decompression and fusion.  Remained on ventilator after surgery and PCCM assumed medical care in ICU.  Pertinent  Medical History  PAD, Hypothyroidism, HTN, HLD, Hiatal hernia, DM type 2, Dementia, Arthritis  Significant Hospital Events:  3/17 Admit, C3-6 decompression with fusion 3/19 extubate  Interim History / Subjective:  C/o feeling weak in her Rt arm and leg.  Feels crooked in her bed despite being repositioned several times.  Objective   Blood pressure (!) 160/57, pulse 66, temperature (!) 97.5 F (36.4 C), temperature source Oral, resp. rate 13, height 5' (1.524 m), weight 52.5 kg, SpO2 99 %.    Vent Mode: PSV;CPAP FiO2 (%):  [36 %-40 %] 36 % Pressure Support:  [12 cmH20] 12 cmH20   Intake/Output Summary (Last 24 hours) at 06/13/2020 0811 Last data filed at 06/13/2020 0600 Gross per 24 hour  Intake 1765.34 ml  Output 1190 ml  Net 575.34 ml   Filed Weights   06/10/20 2345 06/12/20 0424 06/13/20 0500  Weight: 54.5 kg 51.4 kg 52.5 kg    Examination:  General - alert Eyes - pupils reactive ENT - no sinus tenderness, no stridor Cardiac - regular rate/rhythm, no murmur Chest - equal breath sounds b/l, no wheezing or rales Abdomen - soft, non tender, + bowel sounds Extremities - no cyanosis, clubbing, or edema Skin - no rashes Neuro - follows commands, weak in Rt arm and leg   Labs/imaging personally reviewed    CMP Latest Ref Rng & Units 06/13/2020 06/12/2020 06/11/2020  Glucose 70 - 99 mg/dL 83 756(E) 332(R)  BUN  8 - 23 mg/dL 8 17 9   Creatinine 0.44 - 1.00 mg/dL 5.18 8.41  Sodium 135 - 145 mmol/L 129(L) 130(L) 127(L)  Potassium 3.5 - 5.1 mmol/L 2.8(L) 3.0(L) 3.9  Chloride 98 - 111 mmol/L 99 97(L) 93(L)  CO2 22 - 32 mmol/L 26 22 23   Calcium 8.9 - 10.3 mg/dL 8.2(L) 8.2(L) 8.6(L)    CBC Latest Ref Rng & Units 06/12/2020 06/11/2020 06/10/2020  WBC 4.0 - 10.5 K/uL 11.8(H) 7.4 -  Hemoglobin 12.0 - 15.0 g/dL 06/13/2020) 06/12/2020) 6.3(K)  Hematocrit 36.0 - 46.0 % 25.5(L) 25.4(L) 27.0(L)  Platelets 150 - 400 K/uL 292 306 -    ABG    Component Value Date/Time   PHART 7.452 (H) 06/10/2020 2120   PCO2ART 35.8 06/10/2020 2120   PO2ART 305 (H) 06/10/2020 2120   HCO3 26.1 06/10/2020 2120   TCO2 27 06/10/2020 2120   O2SAT 100.0 06/10/2020 2120    CBG (last 3)  Recent Labs    06/12/20 2331 06/13/20 0331 06/13/20 0717  GLUCAP 98 88 98    Resolved Hospital Problem list   Compromised airway after C spine surgery  Assessment & Plan:   Epidural hematoma and cervical spinal stenosis causing Rt sided weakness s/p decompression and fusion on 3/17. - neck drain placed 3/17 - post op care per neurosurgery - keep MAP > 70 per neurosurgery  Fever noted 3/18. - none since - defer ABx for now  Hyponatremia. Hypokalemia, hypophosphatemia, hypomagnesemia. - change  to NS IV fluid at 60 ml/hr - f/u electrolytes - can d/c CVL 3/20 after getting IV electrolyte replacement  Anemia of critical illness and chronic disease. - f/u CBC - transfuse for Hb < 7 or significant bleeding  Hx of dementia. - resume outpt namenda  Hx of HTN, HLD. - resume lipitor - hold outpt ASA, norvasc, atenolol, chlorthalidone, catapres, lisinopril  Hx of hypothyroidism. - resume synthroid  DM type 2 poorly controlled with hyperglycemia. - moderate scale SSI with 5 units levemir  Best practice (evaluated daily)  Diet:  Oral DVT prophylaxis: SCD GI prophylaxis: N/A and PPI Glucose control:  SSI Yes Central venous  access:  D/c 3/20 Mobility:  OOB  PT consulted: Yes Last date of multidisciplinary goals of care discussion [Next week] Code Status:  full code Disposition: ICU  Signature:  Coralyn Helling, MD [facility] Pulmonary/Critical Care Pager - [phone] 06/13/2020, 8:11 AM

## 2020-06-13 NOTE — Progress Notes (Signed)
Inpatient Rehab Admissions Coordinator Note:   Per PT recommendation, pt was screened for CIR candidacy by Wolfgang Phoenix, MS, CCC-SLP.  At this time we are recommending an inpatient rehab consult.  AC will contact attending to request an inpatient rehab consult order.  Please contact me with questions.    Wolfgang Phoenix, MS, CCC-SLP Admissions Coordinator [phone] 06/13/20 4:06 PM

## 2020-06-13 NOTE — Progress Notes (Signed)
Has urine retention.  Has been in/out cath already.  Will have foley placed.  Coralyn Helling, MD [facility] Pulmonary/Critical Care Pager - [phone] 06/13/2020, 11:18 AM

## 2020-06-13 NOTE — Progress Notes (Signed)
Patient ID: Suzanne Fox, female   DOB: 1929-05-06, 87 y.o.   MRN: [id number] BP (!) 153/60 (BP Location: Left Arm)   Pulse 71   Temp 97.7 F (36.5 C) (Oral)   Resp (!) 6   Ht 5' (1.524 m)   Wt 52.5 kg   SpO2 100%   BMI 22.60 kg/m  Alert, follows all commands Moving left side better than right. Will discontinue drain Wound is clean, dry and no signs of infection. Doing well.

## 2020-06-14 DIAGNOSIS — S064X9A Epidural hemorrhage with loss of consciousness of unspecified duration, initial encounter: Secondary | ICD-10-CM

## 2020-06-14 DIAGNOSIS — E43 Unspecified severe protein-calorie malnutrition: Secondary | ICD-10-CM | POA: Insufficient documentation

## 2020-06-14 DIAGNOSIS — I621 Nontraumatic extradural hemorrhage: Secondary | ICD-10-CM | POA: Diagnosis not present

## 2020-06-14 DIAGNOSIS — E871 Hypo-osmolality and hyponatremia: Secondary | ICD-10-CM | POA: Diagnosis not present

## 2020-06-14 LAB — CBC
HCT: 23.1 % — ABNORMAL LOW (ref 36.0–46.0)
Hemoglobin: 7.6 g/dL — ABNORMAL LOW (ref 12.0–15.0)
MCH: 27.4 pg (ref 26.0–34.0)
MCHC: 32.9 g/dL (ref 30.0–36.0)
MCV: 83.4 fL (ref 80.0–100.0)
Platelets: 224 10*3/uL (ref 150–400)
RBC: 2.77 MIL/uL — ABNORMAL LOW (ref 3.87–5.11)
RDW: 13.8 % (ref 11.5–15.5)
WBC: 7.4 10*3/uL (ref 4.0–10.5)
nRBC: 0 % (ref 0.0–0.2)

## 2020-06-14 LAB — GLUCOSE, CAPILLARY
Glucose-Capillary: 212 mg/dL — ABNORMAL HIGH (ref 70–99)
Glucose-Capillary: 126 mg/dL — ABNORMAL HIGH (ref 70–99)
Glucose-Capillary: 244 mg/dL — ABNORMAL HIGH (ref 70–99)
Glucose-Capillary: 207 mg/dL — ABNORMAL HIGH (ref 70–99)
Glucose-Capillary: 215 mg/dL — ABNORMAL HIGH (ref 70–99)

## 2020-06-14 LAB — BASIC METABOLIC PANEL
Anion gap: 6 (ref 5–15)
BUN: <5 mg/dL — ABNORMAL LOW (ref 8–23)
CO2: 24 mmol/L (ref 22–32)
Calcium: 8.3 mg/dL — ABNORMAL LOW (ref 8.9–10.3)
Chloride: 99 mmol/L (ref 98–111)
Creatinine, Ser: 0.46 mg/dL (ref 0.44–1.00)
GFR, Estimated: >60 mL/min (ref >60)
Glucose, Bld: 128 mg/dL — ABNORMAL HIGH (ref 70–99)
Potassium: 4.1 mmol/L (ref 3.5–5.1)
Sodium: 129 mmol/L — ABNORMAL LOW (ref 135–145)

## 2020-06-14 LAB — PHOSPHORUS: Phosphorus: 2.2 mg/dL — ABNORMAL LOW (ref 2.5–4.6)

## 2020-06-14 LAB — MAGNESIUM: Magnesium: 1.5 mg/dL — ABNORMAL LOW (ref 1.7–2.4)

## 2020-06-14 MED ORDER — ADULT MULTIVITAMIN W/MINERALS CH
1.0000 | ORAL_TABLET | Freq: Every day | ORAL | Status: DC
  Administered 2020-06-14 – 2020-06-18 (×5): 1 via ORAL
  Filled 2020-06-14 (×5): qty 1

## 2020-06-14 MED ORDER — SODIUM CHLORIDE 1 G PO TABS
1.0000 g | ORAL_TABLET | Freq: Two times a day (BID) | ORAL | Status: DC
  Administered 2020-06-14 – 2020-06-18 (×9): 1 g via ORAL
  Filled 2020-06-14 (×11): qty 1

## 2020-06-14 MED ORDER — HEPARIN SODIUM (PORCINE) 5000 UNIT/ML IJ SOLN
5000.0000 [IU] | Freq: Two times a day (BID) | INTRAMUSCULAR | Status: DC
  Administered 2020-06-14 – 2020-06-16 (×6): 5000 [IU] via SUBCUTANEOUS
  Filled 2020-06-14 (×6): qty 1

## 2020-06-14 MED ORDER — MAGNESIUM SULFATE 4 GM/100ML IV SOLN
4.0000 g | Freq: Once | INTRAVENOUS | Status: AC
  Administered 2020-06-14: 4 g via INTRAVENOUS
  Filled 2020-06-14: qty 100

## 2020-06-14 MED ORDER — ENSURE ENLIVE PO LIQD
237.0000 mL | Freq: Three times a day (TID) | ORAL | Status: DC
  Administered 2020-06-14 – 2020-06-17 (×9): 237 mL via ORAL

## 2020-06-14 MED ORDER — SODIUM PHOSPHATES 45 MMOLE/15ML IV SOLN
25.0000 mmol | Freq: Once | INTRAVENOUS | Status: AC
  Administered 2020-06-14: 25 mmol via INTRAVENOUS
  Filled 2020-06-14: qty 8.33

## 2020-06-14 NOTE — Progress Notes (Signed)
Physical Therapy Treatment Patient Details Name: Suzanne Fox MRN: [id number] DOB: 1929-08-26 Today's Date: 06/14/2020    History of Present Illness 87 yo female former smoker presented 06/10/20 with Rt shoulder pain and Rt sided weakness.  Found to have spontaneous epidural hematoma with cervical spine cord compression and severe cervical spinal stensosis.  Required emergent C3-C6 decompression and fusion.  Remained on ventilator after surgery with extubation 06/12/20. PMH-PAD, Hypothyroidism, HTN, HLD, Hiatal hernia, DM type 2, Dementia, Arthritis    PT Comments    Patient agreed to attempt OOB today. Once seated EOB, she continues with difficulty holding her head up (Dr. Jake Samples in during session and agreed soft collar during therapies/mobility would be OK). Able to work on sitting posture and progress to standing x 1. Patient using LLE to assist with standing, however no extension noted in RLE during standing. In supine noted RLE with flexor and adductor tone present. Due to poor head positioning in sitting/standing and pt's level of pain, pt returned to bed and elevated into partial chair position with head/trunk supported.    Follow Up Recommendations  CIR     Equipment Recommendations  Wheelchair (measurements PT);Hospital bed    Recommendations for Other Services Rehab consult     Precautions / Restrictions Precautions Precautions: Fall;Cervical Precaution Booklet Issued: No    Mobility  Bed Mobility Overal bed mobility: Needs Assistance Bed Mobility: Rolling;Sidelying to Sit;Sit to Sidelying Rolling: Mod assist (to left) Sidelying to sit: Mod assist     Sit to sidelying: Total assist;+2 for physical assistance General bed mobility comments: up from her right side; required assist to move legs over EOB and raise torso although pt with good use of LUE to assist; fatigued and hurting on return to sidelying after seated EOB ~12 minutes    Transfers Overall transfer level:  Needs assistance Equipment used:  (bed pad) Transfers: Sit to/from Stand Sit to Stand: Max assist;+2 physical assistance         General transfer comment: no weight-bearing noted through RLE; +assist from LLE; will benefit from soft collar (OK'd with Dr. Jake Samples to use during mobility)  Ambulation/Gait             General Gait Details: unable   Stairs             Wheelchair Mobility    Modified Rankin (Stroke Patients Only)       Balance Overall balance assessment: Needs assistance Sitting-balance support: Bilateral upper extremity supported;Feet supported Sitting balance-Leahy Scale: Poor Sitting balance - Comments: leans posteriorly and to her right   Standing balance support: No upper extremity supported Standing balance-Leahy Scale: Poor Standing balance comment: support to bil LEs                            Cognition Arousal/Alertness: Awake/alert Behavior During Therapy: WFL for tasks assessed/performed Overall Cognitive Status: No family/caregiver present to determine baseline cognitive functioning                                 General Comments: hh/o dementia; does not recall having neck surgery and cannot recall after being told reason for her admission and rt sided weakness      Exercises      General Comments General comments (skin integrity, edema, etc.): Noted increased tone in RLE (adduction, flexion) with movement      Pertinent Vitals/Pain Pain  Assessment: 0-10 Pain Score: 7  Pain Location: rt shoulder with any RUE movment Pain Descriptors / Indicators: Burning;Discomfort    Home Living                      Prior Function            PT Goals (current goals can now be found in the care plan section) Acute Rehab PT Goals Patient Stated Goal: regain her strength and go home Time For Goal Achievement: 06/27/20 Potential to Achieve Goals: Good Progress towards PT goals: Progressing toward  goals    Frequency    Min 4X/week      PT Plan Current plan remains appropriate    Co-evaluation              AM-PAC PT "6 Clicks" Mobility   Outcome Measure  Help needed turning from your back to your side while in a flat bed without using bedrails?: Total Help needed moving from lying on your back to sitting on the side of a flat bed without using bedrails?: Total Help needed moving to and from a bed to a chair (including a wheelchair)?: Total Help needed standing up from a chair using your arms (e.g., wheelchair or bedside chair)?: Total Help needed to walk in hospital room?: Total Help needed climbing 3-5 steps with a railing? : Total 6 Click Score: 6    End of Session Equipment Utilized During Treatment: Gait belt Activity Tolerance: Patient limited by fatigue;Patient limited by pain Patient left: in bed;with call bell/phone within reach;with bed alarm set Nurse Communication: Mobility status;Patient requests pain meds PT Visit Diagnosis: Muscle weakness (generalized) (M62.81);Difficulty in walking, not elsewhere classified (R26.2);Other symptoms and signs involving the nervous system (D14.970)     Time: 2637-8588 PT Time Calculation (min) (ACUTE ONLY): 37 min  Charges:  $Therapeutic Activity: 8-22 mins                      [name], PT Pager [phone]    [name] 06/14/2020, 9:36 AM

## 2020-06-14 NOTE — Progress Notes (Signed)
Occupational Therapy Evaluation  PTA pt lives independently with her sister, including driving. Pt repeatedly asking why she couldn't move her arm/leg during session. Attempted to explain however pt did not appear to understand. Requires Mod A to maintain postural control EOB with R/posterior lean. Unable to hold head up in neutral position. Dr Dawley present during session and gave VO to order soft collar to use as needed for comfort/to assist with head support/decrease pain. Required Max A with bed mobility and Max A +2 to attempt to stand although unable to achieve full upright support. Pt overall Max A with UB ADL and Total A with LB ADL. Pt will benefit from extensive rehab at [facility] however will most likely need 24/7 physical assistance after DC.  Will order [facility] for R foot due to footdrop. Please keep RUE supported on pillows. Pt appears to be having neuropathic pain RUE- discussed with NP (Whitney). Will follow acutely.    06/14/20 0940  OT Visit Information  Last OT Received On 06/14/20  Assistance Needed +2  PT/OT/SLP Co-Evaluation/Treatment Yes  Reason for Co-Treatment Complexity of the patient's impairments (multi-system involvement);For patient/therapist safety;To address functional/ADL transfers  OT goals addressed during session ADL's and self-care  History of Present Illness 87 yo female former smoker presented 06/10/20 with Rt shoulder pain and Rt sided weakness.  Found to have spontaneous epidural hematoma with cervical spine cord compression and severe cervical spinal stensosis.  Required emergent C3-C6 decompression and fusion.  Remained on ventilator after surgery with extubation 06/12/20. PMH-PAD, Hypothyroidism, HTN, HLD, Hiatal hernia, DM type 2, Dementia, Arthritis  Precautions  Precautions Fall;Cervical  Precaution Booklet Issued No  Cervical Brace Soft collar (per MD can use soft collar to help support neck if needed)  Home Living  Family/patient expects to be discharged  to: Unsure  Living Arrangements Other relatives  Available Help at Discharge Family;Available 24 hours/day  Type of Home House  Home Access Stairs to enter  Entrance Stairs-Number of Steps 3  Entrance Stairs-Rails None  Home Layout One level  Home Equipment None  Additional Comments may have equipment she can borrow  Prior Function  Level of Independence Independent  Comments including driving  Communication  Communication No difficulties  Pain Assessment  Pain Assessment 0-10  Pain Score 7  Pain Location rt shoulder/UE/Neck  Pain Descriptors / Indicators Burning;Discomfort  Pain Intervention(s) Limited activity within patient's tolerance;Repositioned;Patient requesting pain meds-RN notified;RN gave pain meds during session  Cognition  Arousal/Alertness Awake/alert  Behavior During Therapy [facility] for tasks assessed/performed  Overall Cognitive Status No family/caregiver present to determine baseline cognitive functioning  General Comments h/o dementia; does not recall having neck surgery and cannot recall after being told reason for her admission and rt sided weakness  Upper Extremity Assessment  Upper Extremity Assessment RUE deficits/detail  RUE Deficits / Details apparent history of R shoulder bursitis; limited AROM throughout. Able to make gross grasp but unable to touch tips to palm  - lacks @ 1 inch; hand edematous. Unable to fully extend digits with extensor lag index finger @ 30 degrees; unable to ab/adduct fingers or hold index opposition to thumb; not using R hand funcitonally; elbow flexion3/5; extension 2/5; shoulder flexion 1/5; abd 1/5; ext rot 15; reports abnormal sensation  RUE Sensation decreased light touch;decreased proprioception  RUE Coordination decreased fine motor;decreased gross motor  Lower Extremity Assessment  Lower Extremity Assessment Defer to PT evaluation (note spasticity RLE)  Cervical / Trunk Assessment  Cervical / Trunk Assessment Kyphotic;Other  exceptions  Cervical / Trunk Exceptions s/p neck surgery; head hanging in upright sitting and reports too heavy for her to hold upright  ADL  Overall ADL's  Needs assistance/impaired  Eating/Feeding Moderate assistance  Eating/Feeding Details (indicate cue type and reason) liquids only  Grooming Moderate assistance;Sitting  Upper Body Bathing Maximal assistance;Sitting  Lower Body Bathing Total assistance;Bed level  Upper Body Dressing  Total assistance;Sitting  Lower Body Dressing Total assistance;Bed level  Toilet Transfer Details (indicate cue type and reason) unable to tolerate  Functional mobility during ADLs Maximal assistance;+2 for physical assistance (limited to bed mobility and attempt to stand)  Vision- History  Baseline Vision/History Wears glasses  Vision- Assessment  Additional Comments glasses being brought in by family  Bed Mobility  Overal bed mobility Needs Assistance  Bed Mobility Rolling;Sidelying to Sit;Sit to Sidelying  Rolling Mod assist (to left)  Sidelying to sit Mod assist;+2 for safety/equipment  Sit to sidelying Total assist;+2 for physical assistance  General bed mobility comments up from her right side; required assist to move legs over EOB and raise torso although pt with good use of LUE to assist; fatigued and hurting on return to sidelying after seated EOB ~12 minutes  Transfers  Overall transfer level Needs assistance  Equipment used  (bed pad)  Transfers Sit to/from Stand  Sit to Stand Max assist;+2 physical assistance  General transfer comment no weight-bearing noted through RLE; +assist from LLE; will benefit from soft collar (OK'd with Dr. Jake Samples to use during mobility)  Balance  Overall balance assessment Needs assistance  Sitting-balance support Bilateral upper extremity supported;Feet supported  Sitting balance-Leahy Scale Poor  Sitting balance - Comments leans posteriorly and to her right  Standing balance support No upper extremity  supported  Standing balance-Leahy Scale Zero  General Comments  General comments (skin integrity, edema, etc.) Increased RR adn effort of breathing in sitting  Exercises  Exercises General Upper Extremity  General Exercises - Upper Extremity  Shoulder ABduction AAROM;Right;5 reps  Elbow Extension AROM;Right;5 reps  Shoulder Flexion AAROM;Right;5 reps  Elbow Flexion AAROM;Right;5 reps  Wrist Flexion AROM;Right;5 reps  Wrist Extension AAROM;Right;5 reps  Digit Composite Flexion AAROM;Right;5 reps  Composite Extension AAROM;Right;5 reps  OT - End of Session  Equipment Utilized During Treatment Gait belt  Activity Tolerance Patient tolerated treatment well  Patient left in bed;with call bell/phone within reach;with bed alarm set;with SCD's reapplied;with nursing/sitter in room  Nurse Communication Mobility status;Patient requests pain meds;Other (comment) (positioning needs)  OT Assessment  OT Recommendation/Assessment Patient needs continued OT Services  OT Visit Diagnosis Unsteadiness on feet (R26.81);Other abnormalities of gait and mobility (R26.89);Muscle weakness (generalized) (M62.81);Other symptoms and signs involving cognitive function;Pain  Pain - Right/Left Right  Pain - part of body Arm (neck)  OT Problem List Decreased strength;Decreased range of motion;Decreased activity tolerance;Impaired balance (sitting and/or standing);Decreased coordination;Decreased cognition;Decreased safety awareness;Decreased knowledge of use of DME or AE;Decreased knowledge of precautions;Cardiopulmonary status limiting activity;Impaired sensation;Impaired tone;Impaired UE functional use;Pain;Increased edema  OT Plan  OT Frequency (ACUTE ONLY) Min 2X/week  OT Treatment/Interventions (ACUTE ONLY) Self-care/ADL training;Therapeutic exercise;Neuromuscular education;DME and/or AE instruction;Splinting;Therapeutic activities;Cognitive remediation/compensation;Patient/family education;Balance training   AM-PAC OT "6 Clicks" Daily Activity Outcome Measure (Version 2)  Help from another person eating meals? 2  Help from another person taking care of personal grooming? 2  Help from another person toileting, which includes using toliet, bedpan, or urinal? 1  Help from another person bathing (including washing, rinsing, drying)? 2  Help from another person to put on and taking  off regular upper body clothing? 2  Help from another person to put on and taking off regular lower body clothing? 1  6 Click Score 10  OT Recommendation  Recommendations for Other Services Rehab consult  Follow Up Recommendations CIR;Supervision/Assistance - 24 hour  OT Equipment 3 in 1 bedside commode;Tub/shower bench;Wheelchair (measurements OT);Wheelchair cushion (measurements OT);Hospital bed  Individuals Consulted  Consulted and Agree with Results and Recommendations Patient  Acute Rehab OT Goals  Patient Stated Goal regain her strength and go home  OT Goal Formulation With patient  Time For Goal Achievement 06/28/20  Potential to Achieve Goals Good  OT Time Calculation  OT Start Time (ACUTE ONLY) 0840  OT Stop Time (ACUTE ONLY) 0912  OT Time Calculation (min) 32 min  OT General Charges  $OT Visit 1 Visit  OT Evaluation  $OT Eval Moderate Complexity 1 Mod  Written Expression  Dominant Hand Left  Luisa Dago, OT/L   Acute OT Clinical Specialist Acute Rehabilitation Services Pager [phone] Office [phone]

## 2020-06-14 NOTE — Progress Notes (Signed)
Nutrition Follow-up  DOCUMENTATION CODES:  Severe malnutrition in context of chronic illness  INTERVENTION:  Add Ensure Enlive po TID, each supplement provides 350 kcal and 20 grams of protein.  Add Magic cup TID with meals, each supplement provides 290 kcal and 9 grams of protein.  Add MVI with minerals daily.  Monitor magnesium and phosphorus, MD to replete as needed.  NUTRITION DIAGNOSIS:  Severe Malnutrition related to chronic illness (Dementia) as evidenced by severe fat depletion,severe muscle depletion,moderate fat depletion,moderate muscle depletion.  GOAL:  Patient will meet greater than or equal to 90% of their needs  MONITOR:  PO intake,Supplement acceptance,Labs  REASON FOR ASSESSMENT:  Consult Assessment of nutrition requirement/status,Other (Comment) (Minimal PO intake - at risk for malnutrition)  ASSESSMENT:  87 year old female who presented to the ED on 3/17 with right shoulder pain and right hemiparesis. PMH of T2DM, HTN, HLD, PVD, dementia. Pt found to have right shoulder mass and MRI consistent with subacromial bursitis. Pt also found to have severe stenosis in the upper cervical spine. 3/17 - s/p C3-6 decompression and fusion  3/19 - extubated; tube feeds d/c'd; SLP rec'd thins for full liquid diet 3/20 - Postop drain removed per Neurosurgery 3/21 -  Transfer out of ICU and back to [facility], Inpatient rehab consulted   Previous TF: OG tube placement confirmed, initiate tube feeds: Vital AF 1.2 @ 40 ml/hr (960 ml/day). Tube feeding regimen provides 1152 kcal, 72 grams of protein, and 779 ml of H2O. Tube feeding regimen and current propofol provides 1337 total kcal.  Attempted to speak with pt during visit. Pt has dementia and did not know about her intake or any weight loss. Nurse and nursing student report that she ate 100% of her breakfast this morning (regular vanilla ice cream, grits, and applesauce).  Pt has lost 17 lbs since admission, but has mild edema on  BLE and urinary retention - foley placed. This weight loss is likely fluid.  Recommend adding MVI with minerals, Magic Cup TID, and Ensure TID. Pt open to having supplements and likes Ensure specifically.  Relevant Medications: heparin, SSI, bedtime novolog, levemir, synthroid, Norco Labs: reviewed; Na 129, Glucose 126, Phos 2.2, Mag 1.5  NUTRITION - FOCUSED PHYSICAL EXAM: Flowsheet Row Most Recent Value  Orbital Region Moderate depletion  Upper Arm Region Severe depletion  Thoracic and Lumbar Region Severe depletion  Buccal Region Severe depletion  Temple Region Moderate depletion  Clavicle Bone Region Moderate depletion  Clavicle and Acromion Bone Region Severe depletion  Scapular Bone Region Unable to assess  [Pt unable to sit up]  Dorsal Hand Moderate depletion  Patellar Region Moderate depletion  Anterior Thigh Region Severe depletion  Posterior Calf Region Severe depletion  Edema (RD Assessment) Mild  Hair Reviewed  Eyes Reviewed  Mouth Reviewed  Skin Reviewed  [dry and very pale]  Nails Reviewed     Diet Order:   Diet Order            Diet regular Room service appropriate? Yes; Fluid consistency: Thin  Diet effective now                EDUCATION NEEDS:  Not appropriate for education at this time  Skin:  Skin Assessment: Skin Integrity Issues: Skin Integrity Issues:: Incisions,Other (Comment) Incisions: neck Other: skin tear to head  Last BM:  06/13/20 - Type 4  Height:  Ht Readings from Last 1 Encounters:  06/10/20 5' (1.524 m)   Weight:  Wt Readings from Last 1 Encounters:  06/14/20 52.5 kg   Ideal Body Weight:  45.5 kg  BMI:  Body mass index is 22.6 kg/m.  Estimated Nutritional Needs:  Kcal:  1300-1500 Protein:  75-90 grams Fluid:  >1.3 L  Vertell Limber, RD, LDN Registered Dietitian After Hours/Weekend Pager # in [place]

## 2020-06-14 NOTE — Progress Notes (Signed)
   Providing Compassionate, Quality Care - Together  NEUROSURGERY PROGRESS NOTE   S: No issues overnight. Complains of shoulder pain  O: EXAM:  BP (!) 126/53   Pulse 61   Temp 99.6 F (37.6 C) (Oral)   Resp 15   Ht 5' (1.524 m)   Wt 52.5 kg   SpO2 100%   BMI 22.60 kg/m   Awake, alert, oriented  Speech fluent, appropriate  Eyes open spontaneously, PERRL Face symmetric Wound clean dry and intact Left arm, leg 4+/5 throughout Right arm 1-2/5 prox, distal 3-4/5, right lower extremity 2/5   ASSESSMENT: 87 y.o.femalewith    1.Cervical stenosis with cord signal change due to spontaneous epidural hematoma, cervical spine  -Status post C2-6 decompression andC3-41fusion on 06/10/2020  PLAN: -ok to stepdown -SQH for dvt ppx -Pain control -SCDs -GI prophylaxis -CIR pending -soft collar for comfort   Thank you for allowing me to participate in this patient's care.  Please do not hesitate to call with questions or concerns.   Monia Pouch, DO Neurosurgeon [facility] Cell: [phone]

## 2020-06-14 NOTE — Progress Notes (Signed)
  Speech Language Pathology Treatment: Dysphagia  Patient Details Name: Suzanne Fox MRN: [id number] DOB: Sep 07, 1929 Today's Date: 06/14/2020 Time: 3736-6815 SLP Time Calculation (min) (ACUTE ONLY): 11 min  Assessment / Plan / Recommendation Clinical Impression  Pt was seen for dysphagia treatment and was cooperative throughout the session. Pt's diet was upgraded to regular texture today by MD. Pt and nursing reported that the pt has been tolerating the current diet without overt s/sx of aspiration. Pt stated that her odynophagia has improved to 1-2/10 and that this level is tolerable for her intake of solids. Pt tolerated puree solids, regular texture solids, and thin liquids via straw using consecutive swallows without symptoms of oropharyngeal dysphagia. A regular texture solids with thin liquids is recommended at this time. Further skilled SLP services are not clinically indicated for swallowing.    HPI HPI: 87 yo female presented with Rt shoulder pain and Rt sided weakness.  Found to have spontaneous epidural hematoma with cervical spine cord compression and severe cervical spinal stensosis. Required emergent C3-posterior cervical decompression and fusion. Remained on ventilator after surgery. Pertinent  Medical History: PAD, hypothyroidism, HTN, HLD, Hiatal hernia, DM type 2, Dementia, Arthritis      SLP Plan  Discharge SLP treatment due to (comment)       Recommendations  Diet recommendations: Regular;Thin liquid Liquids provided via: Cup;Straw Medication Administration: Whole meds with puree Supervision: Staff to assist with self feeding Postural Changes and/or Swallow Maneuvers: Seated upright 90 degrees                Oral Care Recommendations: Oral care BID Follow up Recommendations: None SLP Visit Diagnosis: Dysphagia, unspecified (R13.10) Plan: Discharge SLP treatment due to (comment)       Darleen Moffitt I. Vear Clock, MS, CCC-SLP Acute Rehabilitation Services Office  number [phone] Pager [phone]                Scheryl Marten 06/14/2020, 11:15 AM

## 2020-06-14 NOTE — Progress Notes (Signed)
Orthopedic Tech Progress Note Patient Details:  Suzanne Fox 1929-10-21 [id number]  Ortho Devices Type of Ortho Device: Prafo boot/shoe Ortho Device/Splint Location: RLE Ortho Device/Splint Interventions: Ordered,Application   Post Interventions Patient Tolerated: Well Instructions Provided: Care of device   [name] 06/14/2020, 12:06 PM

## 2020-06-14 NOTE — Progress Notes (Addendum)
NAME:  Suzanne Fox, MRN:  [id number], DOB:  1930/02/15, LOS: 3 ADMISSION DATE:  06/10/2020, CONSULTATION DATE:  06/10/2020 REFERRING MD:  Dr. Jake Samples, neurosurgery, CHIEF COMPLAINT:  Rt sided weakness   History of Present Illness:  87 yo female former smoker presented with Rt shoulder pain and Rt sided weakness.  Found to have spontaneous epidural hematoma with cervical spine cord compression and severe cervical spinal stensosis.  Required emergent C3-C6 decompression and fusion.  Remained on ventilator after surgery and PCCM assumed medical care in ICU.  Pertinent  Medical History  PAD, Hypothyroidism, HTN, HLD, Hiatal hernia, DM type 2, Dementia, Arthritis  Significant Hospital Events:   3/17 Admit, C3-6 decompression with fusion  3/19 extubate  Postop drain removed per Neurosurgery   3/21 Transfer out of ICU and back to [facility], Inpatient rehab consulted   Interim History / Subjective:  Lying in bed being prepared to work with physical therapy, some complaints of pain this AM  Objective   Blood pressure (!) 126/53, pulse 61, temperature 99.6 F (37.6 C), temperature source Oral, resp. rate 15, height 5' (1.524 m), weight 52.5 kg, SpO2 100 %.        Intake/Output Summary (Last 24 hours) at 06/14/2020 0825 Last data filed at 06/14/2020 0400 Gross per 24 hour  Intake 1425.7 ml  Output 1160 ml  Net 265.7 ml   Filed Weights   06/12/20 0424 06/13/20 0500 06/14/20 0500  Weight: 51.4 kg 52.5 kg 52.5 kg    Examination: General: Chronically ill appearing frail elderly female lying in bed in NAD HEENT: [place]/AT, MM pink/moist, PERRL,  Neuro: Alert and oriented, flicker of movement seen to command on right upper and lower extremity CV: s1s2 regular rate and rhythm, no murmur, rubs, or gallops,  PULM:  Clear to ascultation bilaterally, no added breath sound, no increased work of breathing GI: soft, bowel sounds active in all 4 quadrants, non-tender, non-distended Extremities: warm/dry,  no edema  Skin: no rashes or lesions  Labs/imaging personally reviewed   N/A  Resolved Hospital Problem list   Fever  Hypokalemia   Assessment & Plan:  Epidural hematoma and cervical spinal stenosis causing Rt sided weakness s/p decompression and fusion on 3/17 P: Management per neurology  MAP goal > 70 per neurosurgery  Maintain neuro protective measures Nutrition and bowel regiment  Aspirations precautions  Mobilize as able   Hyponatremia Hypophosphatemia Hypomagnesemia. P: NS at Trend Bmet Supplement magnesium and phosphors   Right shoulder pain secondary to subacromial bursitis  -MRI shoulder with multiple abnormalities including but not limited to; severe glenohumeral osteoarthritis, massive volume of subacromial/subdeltoid fluid consistent with bursitis and a complete tear of the long head of biceps from the superior labrum. P: Supportive care  Pain management  Curbe side consult ortho  Ice as needed  Can consider IR aspiration of significant pain persist  Sling when mobile   Inadequate nutrition, at risk for malnutrition  P: Dietary supplements as tolerated  Advance diet   Anemia of critical illness and chronic disease P: Hemoglobin slightly down trended to 7.6 Trend CBC  Transfuse for hgb goal greater than 7 Monitor for signs of bleeding   Hx of dementia. P: Continue home Namenda Delirium precautions   Hx of HTN, HLD. P: Continue home Lipitor, ASA, Norvasc, Atenolol, Chlorthaladone, Catapres, and Lisinopril- resume lipitor  Hx of hypothyroidism. P: Continue home Synthroid   DM type 2 poorly controlled with hyperglycemia P: Continue SSI and Levimir   Acute urinary  retention  -Foley placed periop 3/17 and removed 3/18. Unfortunately patient was again see with retention 3/20 and second indwelling foley was place  P: Keep foley in place today  Re-attempt at removal 3/22 If retention redevelops start retention meds and trial of  I&O prior to replacement of indwelling cath  Mobilize as able  Minimize sedation   Best practice (evaluated daily)  Diet:  Oral Pain/Anxiety/Delirium protocol (if indicated): No VAP protocol (if indicated): Not indicated DVT prophylaxis: Subcutaneous Heparin GI prophylaxis: N/A Glucose control:  SSI Yes Central venous access:  N/A Arterial line:  N/A Foley:  Yes, and it is still needed Mobility:  OOB  PT consulted: Yes Last date of multidisciplinary goals of care discussion Per primary  Code Status:  full code Disposition: Transfer to floor   Signature:    Delfin Gant, NP-C [facility] Personal contact information can be found on Amion  If no response please page: Adult pulmonary and critical care medicine pager on Amion unitl 7pm After 7pm please call [phone] 06/14/2020, 8:51 AM

## 2020-06-15 DIAGNOSIS — I621 Nontraumatic extradural hemorrhage: Secondary | ICD-10-CM | POA: Diagnosis not present

## 2020-06-15 LAB — BASIC METABOLIC PANEL WITH GFR
Anion gap: 6 (ref 5–15)
BUN: 6 mg/dL — ABNORMAL LOW (ref 8–23)
CO2: 26 mmol/L (ref 22–32)
Calcium: 8.3 mg/dL — ABNORMAL LOW (ref 8.9–10.3)
Chloride: 96 mmol/L — ABNORMAL LOW (ref 98–111)
Creatinine, Ser: 0.53 mg/dL (ref 0.44–1.00)
GFR, Estimated: >60 mL/min
Glucose, Bld: 196 mg/dL — ABNORMAL HIGH (ref 70–99)
Potassium: 3.6 mmol/L (ref 3.5–5.1)
Sodium: 128 mmol/L — ABNORMAL LOW (ref 135–145)

## 2020-06-15 LAB — GLUCOSE, CAPILLARY
Glucose-Capillary: 140 mg/dL — ABNORMAL HIGH (ref 70–99)
Glucose-Capillary: 161 mg/dL — ABNORMAL HIGH (ref 70–99)
Glucose-Capillary: 234 mg/dL — ABNORMAL HIGH (ref 70–99)
Glucose-Capillary: 218 mg/dL — ABNORMAL HIGH (ref 70–99)

## 2020-06-15 LAB — HEMOGLOBIN AND HEMATOCRIT, BLOOD
HCT: 24 % — ABNORMAL LOW (ref 36.0–46.0)
Hemoglobin: 7.7 g/dL — ABNORMAL LOW (ref 12.0–15.0)

## 2020-06-15 LAB — MAGNESIUM: Magnesium: 1.8 mg/dL (ref 1.7–2.4)

## 2020-06-15 LAB — PHOSPHORUS: Phosphorus: 2.7 mg/dL (ref 2.5–4.6)

## 2020-06-15 NOTE — Progress Notes (Signed)
Physical Therapy Treatment Patient Details Name: Suzanne Fox MRN: [id number] DOB: Jun 04, 1929 Today's Date: 06/15/2020    History of Present Illness 87 yo female former smoker presented 06/10/20 with Rt shoulder pain and Rt sided weakness.  Found to have spontaneous epidural hematoma with cervical spine cord compression and severe cervical spinal stensosis.  Required emergent C3-C6 decompression and fusion.  Remained on ventilator after surgery with extubation 06/12/20. PMH-PAD, Hypothyroidism, HTN, HLD, Hiatal hernia, DM type 2, Dementia, Arthritis    PT Comments    Pt received supine in bed and foley recently removed so goal was to transfer to [facility]. Pt required mod A to LE's and trunk for rolling and SL to sit. Pt worked on sit<>stand with R knee blocked and max A on both sides but pt was unable to clear buttocks from bed or maintain standing for a functional period of time to be able to transfer to [facility]. Pt maintained sitting EOB x17 mins with min to mod A as she fatigued. Changing rec from CIR to SNF and decreasing frequency accordingly. Do not anticipate that pt could tolerate CIR level therapy. PT will continue to follow.    Follow Up Recommendations  SNF     Equipment Recommendations  Wheelchair (measurements PT);Hospital bed    Recommendations for Other Services       Precautions / Restrictions Precautions Precautions: Fall;Cervical Precaution Booklet Issued: No Required Braces or Orthoses: Cervical Brace Cervical Brace: Soft collar (per MD can use soft collar to help support neck if needed) Restrictions Weight Bearing Restrictions: No    Mobility  Bed Mobility Overal bed mobility: Needs Assistance Bed Mobility: Rolling;Sidelying to Sit;Sit to Sidelying Rolling: Mod assist (to left) Sidelying to sit: Mod assist;+2 for safety/equipment   Sit to supine: +2 for physical assistance;Max assist   General bed mobility comments: Mod A to LE's and trunk for elevation into  sitting, head supported for pain control. Pt initiated return to sitting end of session with mgmt of trunk to L but needed max A for LE's into bed and positioning    Transfers Overall transfer level: Needs assistance   Transfers: Sit to/from Stand Sit to Stand: Max assist;+2 physical assistance         General transfer comment: pt with little to no use of RLE or RUE with sit<>stand x2. Max A +2 given but pt did not fully clear buttocks from bed. R knee supported throughout  Ambulation/Gait             General Gait Details: unable   Stairs             Wheelchair Mobility    Modified Rankin (Stroke Patients Only)       Balance Overall balance assessment: Needs assistance Sitting-balance support: Bilateral upper extremity supported;Feet supported Sitting balance-Leahy Scale: Poor Sitting balance - Comments: leans posteriorly and to her right, consistent assist needed in sitting, min progressing to mod with fatigue Postural control: Posterior lean Standing balance support: No upper extremity supported Standing balance-Leahy Scale: Zero Standing balance comment: full support needed                            Cognition Arousal/Alertness: Awake/alert Behavior During Therapy: WFL for tasks assessed/performed Overall Cognitive Status: No family/caregiver present to determine baseline cognitive functioning  General Comments: h/o dementia; does not recall having neck surgery      Exercises Total Joint Exercises Ankle Circles/Pumps: AROM;10 reps;Both;Seated;Limitations Ankle Circles/Pumps Limitations: did not note mvmt at R ankle    General Comments        Pertinent Vitals/Pain Pain Assessment: Faces Faces Pain Scale: Hurts even more Pain Location: rt shoulder/UE/Neck Pain Descriptors / Indicators: Burning;Discomfort Pain Intervention(s): Limited activity within patient's tolerance;Monitored during  session    Home Living                      Prior Function            PT Goals (current goals can now be found in the care plan section) Acute Rehab PT Goals Patient Stated Goal: regain her strength and go home PT Goal Formulation: With patient Time For Goal Achievement: 06/27/20 Potential to Achieve Goals: Fair Progress towards PT goals: Not progressing toward goals - comment (fatigue/ weakness)    Frequency    Min 3X/week      PT Plan Frequency needs to be updated;Discharge plan needs to be updated    Co-evaluation              AM-PAC PT "6 Clicks" Mobility   Outcome Measure  Help needed turning from your back to your side while in a flat bed without using bedrails?: Total Help needed moving from lying on your back to sitting on the side of a flat bed without using bedrails?: Total Help needed moving to and from a bed to a chair (including a wheelchair)?: Total Help needed standing up from a chair using your arms (e.g., wheelchair or bedside chair)?: Total Help needed to walk in hospital room?: Total Help needed climbing 3-5 steps with a railing? : Total 6 Click Score: 6    End of Session Equipment Utilized During Treatment: Gait belt Activity Tolerance: Patient limited by fatigue;Patient limited by pain Patient left: in bed;with call bell/phone within reach;with bed alarm set Nurse Communication: Mobility status;Other (comment) (pt feeling cold despite hot room) PT Visit Diagnosis: Muscle weakness (generalized) (M62.81);Difficulty in walking, not elsewhere classified (R26.2);Other symptoms and signs involving the nervous system (R29.898)     Time: 5638-7564 PT Time Calculation (min) (ACUTE ONLY): 23 min  Charges:  $Therapeutic Activity: 23-37 mins                     Lyanne Co, PT  Acute Rehab Services  Pager [phone] Office [phone]    Lawana Chambers Latasha Buczkowski 06/15/2020, 1:41 PM

## 2020-06-15 NOTE — NC FL2 (Addendum)
[place] MEDICAID FL2 LEVEL OF CARE SCREENING TOOL     IDENTIFICATION  Patient Name: Suzanne Fox Birthdate: 1930-03-12 Sex: female Admission Date (Current Location): 06/10/2020  [facility] and [place] Number:  Producer, television/film/video and Address:  [facility], [address] [place], [place] [address]      Provider Number: [id number]  Attending Physician Name and Address:  Cipriano Bunker, MD  Relative Name and Phone Number:       Current Level of Care: Hospital Recommended Level of Care: Skilled Nursing Facility Prior Approval Number:    Date Approved/Denied:   PASRR Number: [id number]  Discharge Plan: SNF    Current Diagnoses: Patient Active Problem List   Diagnosis Date Noted  . Protein-calorie malnutrition, severe 06/14/2020  . Epidural hematoma (HCC) 06/11/2020  . Right shoulder pain 06/10/2020  . Nontraumatic epidural hematoma (HCC) 06/10/2020  . Dementia without behavioral disturbance (HCC) 06/10/2020  . Hypothyroidism (acquired) 06/10/2020  . Hypertension   . Hyperlipidemia   . Pain due to onychomycosis of toenails of both feet 09/05/2019  . Anterior ischemic optic neuropathy 01/11/2014    Orientation RESPIRATION BLADDER Height & Weight     Self,Situation,Place  Normal Indwelling catheter,Incontinent Weight: 115 lb 4.8 oz (52.3 kg) Height:  5' (152.4 cm)  BEHAVIORAL SYMPTOMS/MOOD NEUROLOGICAL BOWEL NUTRITION STATUS      Incontinent Diet (See DC Summary)  AMBULATORY STATUS COMMUNICATION OF NEEDS Skin   Extensive Assist Verbally Surgical wounds,Skin abrasions (Surgical: Incision (closed) Neck. No dressing. Incision (open or dehisced) skin tear, head right, upper; lateral. Dressing: Foam - Lift to assess site every shift, Change PRN)                       Personal Care Assistance Level of Assistance  Bathing,Feeding,Dressing Bathing Assistance: Limited assistance Feeding assistance: Limited assistance Dressing Assistance:  Limited assistance     Functional Limitations Info  Sight,Hearing,Speech Sight Info: Adequate Hearing Info: Adequate Speech Info: Adequate    SPECIAL CARE FACTORS FREQUENCY  PT (By licensed PT),OT (By licensed OT),Speech therapy     PT Frequency: 5xweek OT Frequency: 5xweek     Speech Therapy Frequency: 5xweek      Contractures Contractures Info: Not present    Additional Factors Info  Code Status,Allergies Code Status Info: Full Allergies Info: Aricept (Donepezil)           Current Medications (06/15/2020):  This is the current hospital active medication list Current Facility-Administered Medications  Medication Dose Route Frequency Provider Last Rate Last Admin  . 0.9 %  sodium chloride infusion   Intravenous Continuous Coralyn Helling, MD 60 mL/hr at 06/15/20 0400 Infusion Verify at 06/15/20 0400  . acetaminophen (TYLENOL) tablet 650 mg  650 mg Oral Q4H PRN Coralyn Helling, MD   650 mg at 06/15/20 6384   Or  . acetaminophen (TYLENOL) suppository 650 mg  650 mg Rectal Q4H PRN Coralyn Helling, MD      . alum & mag hydroxide-simeth (MAALOX/MYLANTA) 200-200-20 MG/5ML suspension 30 mL  30 mL Oral Q6H PRN Coralyn Helling, MD      . amLODipine (NORVASC) tablet 10 mg  10 mg Oral Daily Coralyn Helling, MD   10 mg at 06/15/20 0953  . atenolol (TENORMIN) tablet 25 mg  25 mg Oral Daily Coralyn Helling, MD   25 mg at 06/15/20 0953  . atorvastatin (LIPITOR) tablet 40 mg  40 mg Oral Daily Coralyn Helling, MD   40 mg at 06/15/20  7062  . chlorhexidine (PERIDEX) 0.12 % solution 15 mL  15 mL Mouth Rinse BID Coralyn Helling, MD   15 mL at 06/15/20 0953  . feeding supplement (ENSURE ENLIVE / ENSURE PLUS) liquid 237 mL  237 mL Oral TID BM Coralyn Helling, MD   237 mL at 06/15/20 0957  . heparin injection 5,000 Units  5,000 Units Subcutaneous Q12H Dawley, Troy C, DO   5,000 Units at 06/15/20 0953  . HYDROcodone-acetaminophen (NORCO) 7.5-325 MG per tablet 1 tablet  1 tablet Oral Q4H PRN Coralyn Helling, MD   1 tablet at  06/14/20 2329  . insulin aspart (novoLOG) injection 0-15 Units  0-15 Units Subcutaneous TID WC Coralyn Helling, MD   2 Units at 06/15/20 [phone]  . insulin aspart (novoLOG) injection 0-5 Units  0-5 Units Subcutaneous QHS Coralyn Helling, MD   2 Units at 06/14/20 2114  . insulin detemir (LEVEMIR) injection 5 Units  5 Units Subcutaneous Daily Coralyn Helling, MD   5 Units at 06/14/20 0932  . levothyroxine (SYNTHROID) tablet 25 mcg  25 mcg Oral Q0600 Coralyn Helling, MD   25 mcg at 06/15/20 0704  . MEDLINE mouth rinse  15 mL Mouth Rinse q12n4p Coralyn Helling, MD   15 mL at 06/14/20 1220  . memantine (NAMENDA) tablet 10 mg  10 mg Oral q morning Coralyn Helling, MD   10 mg at 06/15/20 0953  . menthol-cetylpyridinium (CEPACOL) lozenge 3 mg  1 lozenge Oral PRN Dawley, Troy C, DO       Or  . phenol (CHLORASEPTIC) mouth spray 1 spray  1 spray Mouth/Throat PRN Dawley, Troy C, DO   1 spray at 06/14/20 1627  . methocarbamol (ROBAXIN) tablet 500 mg  500 mg Oral Q6H PRN Coralyn Helling, MD   500 mg at 06/14/20 2329   Or  . methocarbamol (ROBAXIN) 500 mg in dextrose 5 % 50 mL IVPB  500 mg Intravenous Q6H PRN Coralyn Helling, MD      . multivitamin with minerals tablet 1 tablet  1 tablet Oral Daily Coralyn Helling, MD   1 tablet at 06/15/20 0953  . ondansetron (ZOFRAN) tablet 4 mg  4 mg Oral Q6H PRN Coralyn Helling, MD       Or  . ondansetron (ZOFRAN) injection 4 mg  4 mg Intravenous Q6H PRN Coralyn Helling, MD      . sodium chloride flush (NS) 0.9 % injection 10-40 mL  10-40 mL Intracatheter Q12H Jonah Blue, MD   10 mL at 06/14/20 0935  . sodium chloride flush (NS) 0.9 % injection 10-40 mL  10-40 mL Intracatheter PRN Jonah Blue, MD      . sodium chloride tablet 1 g  1 g Oral BID Raymon Mutton F, NP   1 g at 06/14/20 2114  . white petrolatum (VASELINE) gel   Topical PRN Coralyn Helling, MD         Discharge Medications: Please see discharge summary for a list of discharge medications.  Relevant Imaging Results:  Relevant Lab  Results:   Additional Information SSN: [id number]  Chana Bode, Student-Social Work

## 2020-06-15 NOTE — Progress Notes (Signed)
PROGRESS NOTE    Suzanne Fox  KCL:[id number] DOB: [birth year] DOA: 06/10/2020 PCP: Georgianne Fick, MD   Brief Narrative: This 87 years old female with PMH significant for tobacco abuse, PAD, hypothyroidism, hypertension, hyperlipidemia, diabetes, dementia, arthritis presents in the ER with right shoulder pain and right-sided weakness.  She is found to have  spontaneous epidural hematoma with cervical cord compression and severe cervical spinal stenosis.  Neurosurgery was consulted,  patient underwent emergent C3-C6 decompression and fusion.  Patient was initially admitted in ICU requiring ventilator support.  She successfully extubated on 3/19.  PCCM pickup 3/22.  Neurosurgery recommended continue supportive care pain control.  Assessment & Plan:   Principal Problem:   Nontraumatic epidural hematoma (HCC) Active Problems:   Right shoulder pain   Dementia without behavioral disturbance (HCC)   Hypertension   Hyperlipidemia   Hypothyroidism (acquired)   Epidural hematoma (HCC)   Protein-calorie malnutrition, severe  Right-sided weakness sec. to spontaneous epidural hematoma and cervical canal stenosis. Patient underwent emergent decompression and spinal fusion 3/17 Neurosurgery is following,  recommended continue supportive care. Adequate pain control, aspiration precautions, mobilize as tolerated. PT and OT recommendation.  Hyponatremia: This could be secondary to neurosurgery. Continue normal saline. Recheck labs in the morning.  Right shoulder pain secondary to subacromial bursitis  -MRI shoulder ; severe glenohumeral osteoarthritis, massive volume of subacromial/subdeltoid fluid consistent with bursitis and a complete tear of the long head of biceps from the superior labrum. Adequate pain control, continue supportive care.  Ice as needed. Consult orthopedics.    Anemia of chronic illness and chronic disease Hemoglobin slightly down trended to 7.6 Trend CBC   Transfuse for hgb goal greater than 7 Monitor for signs of bleeding   Hx of dementia. Continue home Namenda Delirium precautions   Essential hypertension: Continue Norvasc, atenolol, chlorthalidone, Catapres and lisinopril.  Hyperlipidemia : continue Lipitor  Hypothyroidism. Continue home Synthroid   DM type 2 poorly controlled with hyperglycemia Continue SSI and Levimir   Acute urinary retention  Keep foley in place today  Re-attempt at removal 3/22 If retention redevelops start retention meds and trial of I&O prior to replacement of indwelling cath  Mobilize as able  Minimize sedation     DVT prophylaxis: Heparin subcu Code Status: Full code Family Communication: No family at bedside  disposition Plan:   Status is: Inpatient  Remains inpatient appropriate because:Inpatient level of care appropriate due to severity of illness   Dispo: The patient is from: Home              Anticipated d/c is to: SNF              Patient currently is not medically stable to d/c.   Difficult to place patient No   Consultants:   Neurosurgery  Procedures: Emergent C3 C6 decompression and fusion.  Antimicrobials:  Anti-infectives (From admission, onward)   Start     Dose/Rate Route Frequency Ordered Stop   06/11/20 0200  ceFAZolin (ANCEF) IVPB 2g/100 mL premix        2 g 200 mL/hr over 30 Minutes Intravenous Every 8 hours 06/10/20 2252 06/11/20 0945   06/10/20 1716  ceFAZolin (ANCEF) 2-4 GM/100ML-% IVPB       Note to Pharmacy: Payton Emer[facility]   : cabinet override      06/10/20 1716 06/11/20 0207   06/10/20 1621  ceFAZolin (ANCEF) IVPB 2g/100 mL premix        2 g 200 mL/hr over 30 Minutes Intravenous 30  min pre-op 06/10/20 1621 06/10/20 1800      Subjective: Patient was seen and examined at bedside.  Overnight events noted.   She is alert and oriented x 3,  she reports neck pain is better controlled.  Objective: Vitals:   06/15/20 0537 06/15/20 0735 06/15/20 1154  06/15/20 1529  BP: (!) 164/55 (!) 166/66 (!) 146/60 (!) 122/43  Pulse: 67 71 64 65  Resp:  18 18 18   Temp: 98.3 F (36.8 C) 99.9 F (37.7 C) 99.1 F (37.3 C) 99.6 F (37.6 C)  TempSrc: Oral Oral Oral Oral  SpO2: 97% 98% 96% 96%  Weight:      Height:        Intake/Output Summary (Last 24 hours) at 06/15/2020 1742 Last data filed at 06/15/2020 1447 Gross per 24 hour  Intake 1498.79 ml  Output 1150 ml  Net 348.79 ml   Filed Weights   06/13/20 0500 06/14/20 0500 06/15/20 0500  Weight: 52.5 kg 52.5 kg 52.3 kg    Examination:  General exam: Appears calm and comfortable, does not appear in any acute distress. Respiratory system: Clear to auscultation. Respiratory effort normal. Cardiovascular system: S1 & S2 heard, RRR. No JVD, murmurs, rubs, gallops or clicks. No pedal edema. Gastrointestinal system: Abdomen is nondistended, soft and nontender. No organomegaly or masses felt. Normal bowel sounds heard. Central nervous system: Alert and oriented. No focal neurological deficits. Extremities: Symmetric 5 x 5 power.  No edema, no cyanosis, no clubbing. Skin: No rashes, lesions or ulcers Psychiatry: Judgement and insight appear normal. Mood & affect appropriate.     Data Reviewed: I have personally reviewed following labs and imaging studies  CBC: Recent Labs  Lab 06/10/20 0114 06/10/20 1954 06/10/20 2120 06/11/20 0437 06/12/20 0418 06/14/20 0345 06/15/20 0821  WBC 9.5  --   --  7.4 11.8* 7.4  --   HGB 11.1*   < > 9.2* 8.6* 8.4* 7.6* 7.7*  HCT 35.4*   < > 27.0* 25.4* 25.5* 23.1* 24.0*  MCV 86.6  --   --  80.6 81.7 83.4  --   PLT 210  --   --  306 292 224  --    < > = values in this interval not displayed.   Basic Metabolic Panel: Recent Labs  Lab 06/12/20 0418 06/12/20 1942 06/13/20 0359 06/13/20 1307 06/14/20 0345 06/15/20 0219  NA 130*  --  129* 128* 129* 128*  K 3.0*  --  2.8* 3.9 4.1 3.6  CL 97*  --  99 99 99 96*  CO2 22  --  26 26 24 26   GLUCOSE 259*   --  83 142* 128* 196*  BUN 17  --  8 7* <5* 6*  CREATININE 0.82  --  0.50 0.45 0.46 0.53  CALCIUM 8.2*  --  8.2* 8.1* 8.3* 8.3*  MG 1.0* 2.3 2.1  --  1.5* 1.8  PHOS 2.3*  --  2.4*  --  2.2* 2.7   GFR: Estimated Creatinine Clearance: 34.9 mL/min (by C-G formula based on SCr of 0.53 mg/dL). Liver Function Tests: No results for input(s): AST, ALT, ALKPHOS, BILITOT, PROT, ALBUMIN in the last 168 hours. No results for input(s): LIPASE, AMYLASE in the last 168 hours. No results for input(s): AMMONIA in the last 168 hours. Coagulation Profile: Recent Labs  Lab 06/10/20 1631  INR 1.1   Cardiac Enzymes: No results for input(s): CKTOTAL, CKMB, CKMBINDEX, TROPONINI in the last 168 hours. BNP (last 3 results) No results for  input(s): PROBNP in the last 8760 hours. HbA1C: No results for input(s): HGBA1C in the last 72 hours. CBG: Recent Labs  Lab 06/14/20 1610 06/14/20 2103 06/15/20 0614 06/15/20 1151 06/15/20 1632  GLUCAP 207* 215* 140* 161* 234*   Lipid Profile: No results for input(s): CHOL, HDL, LDLCALC, TRIG, CHOLHDL, LDLDIRECT in the last 72 hours. Thyroid Function Tests: No results for input(s): TSH, T4TOTAL, FREET4, T3FREE, THYROIDAB in the last 72 hours. Anemia Panel: No results for input(s): VITAMINB12, FOLATE, FERRITIN, TIBC, IRON, RETICCTPCT in the last 72 hours. Sepsis Labs: No results for input(s): PROCALCITON, LATICACIDVEN in the last 168 hours.  Recent Results (from the past 240 hour(s))  Resp Panel by RT-PCR (Flu A&B, Covid) Nasopharyngeal Swab     Status: None   Collection Time: 06/10/20  5:52 AM   Specimen: Nasopharyngeal Swab; Nasopharyngeal(NP) swabs in vial transport medium  Result Value Ref Range Status   SARS Coronavirus 2 by RT PCR NEGATIVE NEGATIVE Final    Comment: (NOTE) SARS-CoV-2 target nucleic acids are NOT DETECTED.  The SARS-CoV-2 RNA is generally detectable in upper respiratory specimens during the acute phase of infection. The  lowest concentration of SARS-CoV-2 viral copies this assay can detect is 138 copies/mL. A negative result does not preclude SARS-Cov-2 infection and should not be used as the sole basis for treatment or other patient management decisions. A negative result may occur with  improper specimen collection/handling, submission of specimen other than nasopharyngeal swab, presence of viral mutation(s) within the areas targeted by this assay, and inadequate number of viral copies(<138 copies/mL). A negative result must be combined with clinical observations, patient history, and epidemiological information. The expected result is Negative.  Fact Sheet for Patients:  BloggerCourse.com  Fact Sheet for Healthcare Providers:  SeriousBroker.it  This test is no t yet approved or cleared by the [place] FDA and  has been authorized for detection and/or diagnosis of SARS-CoV-2 by FDA under an Emergency Use Authorization (EUA). This EUA will remain  in effect (meaning this test can be used) for the duration of the COVID-19 declaration under Section 564(b)(1) of the Act, 21 U.S.C.section 360bbb-3(b)(1), unless the authorization is terminated  or revoked sooner.       Influenza A by PCR NEGATIVE NEGATIVE Final   Influenza B by PCR NEGATIVE NEGATIVE Final    Comment: (NOTE) The Xpert Xpress SARS-CoV-2/FLU/RSV plus assay is intended as an aid in the diagnosis of influenza from Nasopharyngeal swab specimens and should not be used as a sole basis for treatment. Nasal washings and aspirates are unacceptable for Xpert Xpress SARS-CoV-2/FLU/RSV testing.  Fact Sheet for Patients: BloggerCourse.com  Fact Sheet for Healthcare Providers: SeriousBroker.it  This test is not yet approved or cleared by the [place] FDA and has been authorized for detection and/or diagnosis of SARS-CoV-2 by FDA under  an Emergency Use Authorization (EUA). This EUA will remain in effect (meaning this test can be used) for the duration of the COVID-19 declaration under Section 564(b)(1) of the Act, 21 U.S.C. section 360bbb-3(b)(1), unless the authorization is terminated or revoked.  Performed at [facility], [address], [place], [place] [address]     Radiology Studies: No results found.  Scheduled Meds: . amLODipine  10 mg Oral Daily  . atenolol  25 mg Oral Daily  . atorvastatin  40 mg Oral Daily  . chlorhexidine  15 mL Mouth Rinse BID  . feeding supplement  237 mL Oral TID BM  . heparin injection (subcutaneous)  5,000 Units Subcutaneous Q12H  . insulin aspart  0-15 Units Subcutaneous TID WC  . insulin aspart  0-5 Units Subcutaneous QHS  . insulin detemir  5 Units Subcutaneous Daily  . levothyroxine  25 mcg Oral Q0600  . mouth rinse  15 mL Mouth Rinse q12n4p  . memantine  10 mg Oral q morning  . multivitamin with minerals  1 tablet Oral Daily  . sodium chloride flush  10-40 mL Intracatheter Q12H  . sodium chloride  1 g Oral BID   Continuous Infusions: . sodium chloride 60 mL/hr at 06/15/20 0400  . methocarbamol (ROBAXIN) IV       LOS: 4 days    Time spent: 35 mins    Mujahid Jalomo, MD [facility]   If 7PM-7AM, please contact night-coverage

## 2020-06-15 NOTE — Progress Notes (Signed)
Inpatient Rehabilitation Admissions Coordinator  I met with patient at bedside . She is asking to receive her rehab at the rehab on [facility]. I have updated TOC and we will sign off at this time.  Danne Baxter, RN, MSN Rehab Admissions Coordinator [phone] 06/15/2020 10:01 AM

## 2020-06-15 NOTE — Progress Notes (Signed)
Report called to Lurena Joiner at this time on [address] bed 18. Will transfer patient at this time.

## 2020-06-15 NOTE — Progress Notes (Addendum)
   Providing Compassionate, Quality Care - Together  NEUROSURGERY PROGRESS NOTE   S: No issues overnight.  O: EXAM:  BP (!) 166/66 (BP Location: Left Arm)   Pulse 71   Temp 99.9 F (37.7 C) (Oral)   Resp 18   Ht 5' (1.524 m)   Wt 52.3 kg   SpO2 98%   BMI 22.52 kg/m   Awake, alert, oriented  Speech fluent, appropriate  Eyes open spontaneously, PERRL Face symmetric Wound clean dry and intact Left arm, leg 4+/5 throughout Right arm1-2/5 prox, distal 3-4/5, right lower extremity2/5   ASSESSMENT: 87 y.o.femalewith   1.Cervical stenosis with cord signal change due to spontaneous epidural hematoma, cervical spine  -Status post C2-6 decompression andC3-69fusion on 06/10/2020  PLAN: -SQH for dvt ppx -Pain control -SCDs -GI prophylaxis -rehab pending -soft collar for comfort -update her niece, discussed prognosis and recovery, answered questions    Thank you for allowing me to participate in this patient's care.  Please do not hesitate to call with questions or concerns.   Monia Pouch, DO Neurosurgeon [facility] Cell: [phone]

## 2020-06-15 NOTE — TOC Initial Note (Signed)
Transition of Care [facility]) - Initial/Assessment Note    Patient Details  Name: Suzanne Fox MRN: [id number] Date of Birth: 1929/09/14  Transition of Care [facility]) CM/SW Contact:    Chana Bode, Student-Social Work Phone Number: 06/15/2020, 11:37 AM  Clinical Narrative: MSW Student spoke with patient and informed her that her first choice SNF, [facility] had accepted her. She asked MSW Student to call Boone Master, her niece, and inform her. MSW Student called Cherrie and informed her of patients choice. Will fax out insurance.                  Expected Discharge Plan: Skilled Nursing Facility Barriers to Discharge: Continued Medical Work up,Insurance Authorization   Patient Goals and CMS Choice Patient states their goals for this hospitalization and ongoing recovery are:: Go to [employer].gov Compare Post Acute Care list provided to:: Patient Choice offered to / list presented to : Patient  Expected Discharge Plan and Services Expected Discharge Plan: Skilled Nursing Facility       Living arrangements for the past 2 months: Single Family Home                                      Prior Living Arrangements/Services Living arrangements for the past 2 months: Single Family Home Lives with:: Siblings Patient language and need for interpreter reviewed:: Yes Do you feel safe going back to the place where you live?: Yes      Need for Family Participation in Patient Care: No (Comment) Care giver support system in place?: No (comment)   Criminal Activity/Legal Involvement Pertinent to Current Situation/Hospitalization: No - Comment as needed  Activities of Daily Living Home Assistive Devices/Equipment: None ADL Screening (condition at time of admission) Patient's cognitive ability adequate to safely complete daily activities?: No Is the patient deaf or have difficulty hearing?: Yes Does the patient have difficulty seeing, even when wearing  glasses/contacts?: No Does the patient have difficulty concentrating, remembering, or making decisions?: No Patient able to express need for assistance with ADLs?: No Does the patient have difficulty dressing or bathing?: Yes Independently performs ADLs?: Yes (appropriate for developmental age) Does the patient have difficulty walking or climbing stairs?: Yes Weakness of Legs: Both Weakness of Arms/Hands: Both  Permission Sought/Granted Permission sought to share information with : Facility Contractor granted to share information with : Yes, Verbal Permission Granted  Share Information with NAME: Cherrie  Permission granted to share info w AGENCY: SNF  Permission granted to share info w Relationship: Niece     Emotional Assessment Appearance:: Appears stated age Attitude/Demeanor/Rapport: Engaged Affect (typically observed): Appropriate Orientation: : Oriented to Self,Oriented to Place,Oriented to Situation Alcohol / Substance Use: Not Applicable Psych Involvement: No (comment)  Admission diagnosis:  Hyponatremia [E87.1] Weakness [R53.1] Right shoulder pain [M25.511] Pain in joint of right shoulder [M25.511] Epidural hematoma (HCC) [S06.4X9A] Patient Active Problem List   Diagnosis Date Noted  . Protein-calorie malnutrition, severe 06/14/2020  . Epidural hematoma (HCC) 06/11/2020  . Right shoulder pain 06/10/2020  . Nontraumatic epidural hematoma (HCC) 06/10/2020  . Dementia without behavioral disturbance (HCC) 06/10/2020  . Hypothyroidism (acquired) 06/10/2020  . Hypertension   . Hyperlipidemia   . Pain due to onychomycosis of toenails of both feet 09/05/2019  . 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 [address] Phone: [phone] Fax:  [phone]     Social Determinants of Health (SDOH) Interventions    Readmission Risk Interventions No flowsheet data found.

## 2020-06-16 DIAGNOSIS — I621 Nontraumatic extradural hemorrhage: Secondary | ICD-10-CM | POA: Diagnosis not present

## 2020-06-16 LAB — GLUCOSE, CAPILLARY
Glucose-Capillary: 184 mg/dL — ABNORMAL HIGH (ref 70–99)
Glucose-Capillary: 313 mg/dL — ABNORMAL HIGH (ref 70–99)
Glucose-Capillary: 217 mg/dL — ABNORMAL HIGH (ref 70–99)
Glucose-Capillary: 170 mg/dL — ABNORMAL HIGH (ref 70–99)
Glucose-Capillary: 221 mg/dL — ABNORMAL HIGH (ref 70–99)

## 2020-06-16 LAB — SARS CORONAVIRUS 2 (TAT 6-24 HRS): SARS Coronavirus 2: NEGATIVE

## 2020-06-16 NOTE — Progress Notes (Signed)
Pt bladder scanned at 2230, read 79, pt cleaned and made comfortable in bed, will continue to monitor. Obasogie-Asidi, Sandip Power Efe

## 2020-06-16 NOTE — Progress Notes (Signed)
   Providing Compassionate, Quality Care - Together  NEUROSURGERY PROGRESS NOTE   S: No issues overnight. Sitting up eating breakfast  O: EXAM:  BP (!) 131/57 (BP Location: Left Arm)   Pulse 68   Temp 98.5 F (36.9 C) (Oral)   Resp 16   Ht 5' (1.524 m)   Wt 65.3 kg   SpO2 98%   BMI 28.12 kg/m   Awake, alert, oriented  Speech fluent, appropriate Eyes open spontaneously, PERRL Face symmetric Wound clean dry and intact Left arm, leg 4+/5 throughout Right arm1-2/5prox, distal 3-4/5, right lower extremity2/5   ASSESSMENT: 87 y.o.femalewith   1.Cervical stenosis with cord signal change due to spontaneous epidural hematoma, cervical spine  -Status post C2-6 decompression andC3-55fusion on 06/10/2020  PLAN: -SQH for dvt ppx -Pain control -SCDs -GI prophylaxis -rehab pending -soft collar for comfort   Thank you for allowing me to participate in this patient's care.  Please do not hesitate to call with questions or concerns.   Monia Pouch, DO Neurosurgeon [facility] Cell: [phone]

## 2020-06-16 NOTE — Progress Notes (Addendum)
PROGRESS NOTE    Suzanne Fox  OBS:[id number] DOB: 04-11-1929 DOA: 06/10/2020 PCP: Georgianne Fick, MD   Brief Narrative: This 87 years old female with PMH significant for tobacco abuse, PAD, hypothyroidism, hypertension, hyperlipidemia, diabetes, dementia, arthritis presents in the ER with right shoulder pain and right-sided weakness.  She is found to have  spontaneous epidural hematoma with cervical cord compression and severe cervical spinal stenosis.  Neurosurgery was consulted,  patient underwent emergent C3-C6 decompression and fusion.  Patient was initially admitted in ICU requiring ventilator support.  She was successfully extubated on 3/19.  PCCM pickup 3/22.  Neurosurgery recommended continue supportive care pain control.  Assessment & Plan:   Principal Problem:   Nontraumatic epidural hematoma (HCC) Active Problems:   Right shoulder pain   Dementia without behavioral disturbance (HCC)   Hypertension   Hyperlipidemia   Hypothyroidism (acquired)   Epidural hematoma (HCC)   Protein-calorie malnutrition, severe  Right-sided weakness sec. to spontaneous epidural hematoma and cervical canal stenosis. Patient underwent emergent decompression and cervical  spinal fusion on 3/17. Neurosurgery is following,  recommended continue supportive care. Adequate pain control, aspiration precautions, mobilize as tolerated. PT and OT recommendation : SNF vs CIR.  Hyponatremia: This could be secondary to neurosurgery. Continue salt tablets. Recheck labs in the morning.  Right shoulder pain secondary to subacromial bursitis  -MRI shoulder ; severe glenohumeral osteoarthritis, massive volume of subacromial/subdeltoid fluid consistent with bursitis and a complete tear of the long head of biceps from the superior labrum. Adequate pain control, continue supportive care.  Ice as needed. Consulted orthopedics., follow up recommendation.   Anemia of chronic illness and chronic  disease Hemoglobin slightly down trended to 7.6 Trend CBC  Transfuse for hgb goal greater than 7 Monitor for signs of bleeding   Hx of dementia. Continue home Namenda Delirium precautions .  Essential hypertension: Continue Norvasc, atenolol, chlorthalidone, Catapres and lisinopril.  Hyperlipidemia : continue Lipitor  Hypothyroidism. Continue home Synthroid   DM type 2 poorly controlled with hyperglycemia Continue SSI and Levimir   Acute urinary retention : Keep foley in place today  Re-attempt at removal 3/22 If retention redevelops start retention meds and trial of I&O prior to replacement of indwelling cath  Mobilize as able  Minimize sedation     DVT prophylaxis: Heparin subcu Code Status: Full code Family Communication: No family at bedside  disposition Plan:   Status is: Inpatient  Remains inpatient appropriate because:Inpatient level of care appropriate due to severity of illness   Dispo: The patient is from: Home              Anticipated d/c is to: SNF              Patient currently is not medically stable to d/c.   Difficult to place patient No   Consultants:   Neurosurgery  Procedures: Emergent C3 C6 decompression and fusion.   Antimicrobials:  Anti-infectives (From admission, onward)   Start     Dose/Rate Route Frequency Ordered Stop   06/11/20 0200  ceFAZolin (ANCEF) IVPB 2g/100 mL premix        2 g 200 mL/hr over 30 Minutes Intravenous Every 8 hours 06/10/20 2252 06/11/20 0945   06/10/20 1716  ceFAZolin (ANCEF) 2-4 GM/100ML-% IVPB       Note to Pharmacy: Payton Emerald   : cabinet override      06/10/20 1716 06/11/20 0207   06/10/20 1621  ceFAZolin (ANCEF) IVPB 2g/100 mL premix  2 g 200 mL/hr over 30 Minutes Intravenous 30 min pre-op 06/10/20 1621 06/10/20 1800      Subjective: Patient was seen and examined at bedside.  Overnight events noted.   She reports feeling better, reports right shoulder and back pain, not improved.   She is alert and oriented x 3.   Objective: Vitals:   06/16/20 0406 06/16/20 0500 06/16/20 0750 06/16/20 1150  BP: (!) 158/108  (!) 131/57 (!) 118/47  Pulse: 74  68 63  Resp: 18  16 20   Temp: 98.6 F (37 C)  98.5 F (36.9 C) 98.7 F (37.1 C)  TempSrc: Oral  Oral Oral  SpO2: 95%  98% 98%  Weight:  65.3 kg    Height:        Intake/Output Summary (Last 24 hours) at 06/16/2020 1429 Last data filed at 06/16/2020 1314 Gross per 24 hour  Intake 872 ml  Output 1500 ml  Net -628 ml   Filed Weights   06/14/20 0500 06/15/20 0500 06/16/20 0500  Weight: 52.5 kg 52.3 kg 65.3 kg    Examination:  General exam: Appears calm and comfortable, does not appear in any acute distress. Respiratory system: Clear to auscultation. Respiratory effort normal. Cardiovascular system: S1 & S2 heard, RRR. No JVD, murmurs, rubs, gallops or clicks. No pedal edema. Gastrointestinal system: Abdomen is nondistended, soft and nontender. No organomegaly or masses felt. Normal bowel sounds heard. Central nervous system: Alert and oriented. Right arm weakness, poor grip. Extremities: .  No edema, no cyanosis, no clubbing. Skin: No rashes, lesions or ulcers Psychiatry: Judgement and insight appear normal. Mood & affect appropriate.     Data Reviewed: I have personally reviewed following labs and imaging studies  CBC: Recent Labs  Lab 06/10/20 0114 06/10/20 1954 06/10/20 2120 06/11/20 0437 06/12/20 0418 06/14/20 0345 06/15/20 0821  WBC 9.5  --   --  7.4 11.8* 7.4  --   HGB 11.1*   < > 9.2* 8.6* 8.4* 7.6* 7.7*  HCT 35.4*   < > 27.0* 25.4* 25.5* 23.1* 24.0*  MCV 86.6  --   --  80.6 81.7 83.4  --   PLT 210  --   --  306 292 224  --    < > = values in this interval not displayed.   Basic Metabolic Panel: Recent Labs  Lab 06/12/20 0418 06/12/20 1942 06/13/20 0359 06/13/20 1307 06/14/20 0345 06/15/20 0219  NA 130*  --  129* 128* 129* 128*  K 3.0*  --  2.8* 3.9 4.1 3.6  CL 97*  --  99 99 99 96*   CO2 22  --  26 26 24 26   GLUCOSE 259*  --  83 142* 128* 196*  BUN 17  --  8 7* <5* 6*  CREATININE 0.82  --  0.50 0.45 0.46 0.53  CALCIUM 8.2*  --  8.2* 8.1* 8.3* 8.3*  MG 1.0* 2.3 2.1  --  1.5* 1.8  PHOS 2.3*  --  2.4*  --  2.2* 2.7   GFR: Estimated Creatinine Clearance: 41 mL/min (by C-G formula based on SCr of 0.53 mg/dL). Liver Function Tests: No results for input(s): AST, ALT, ALKPHOS, BILITOT, PROT, ALBUMIN in the last 168 hours. No results for input(s): LIPASE, AMYLASE in the last 168 hours. No results for input(s): AMMONIA in the last 168 hours. Coagulation Profile: Recent Labs  Lab 06/10/20 1631  INR 1.1   Cardiac Enzymes: No results for input(s): CKTOTAL, CKMB, CKMBINDEX, TROPONINI in the last  168 hours. BNP (last 3 results) No results for input(s): PROBNP in the last 8760 hours. HbA1C: No results for input(s): HGBA1C in the last 72 hours. CBG: Recent Labs  Lab 06/15/20 1151 06/15/20 1632 06/15/20 2106 06/16/20 0609 06/16/20 1152  GLUCAP 161* 234* 218* 184* 313*   Lipid Profile: No results for input(s): CHOL, HDL, LDLCALC, TRIG, CHOLHDL, LDLDIRECT in the last 72 hours. Thyroid Function Tests: No results for input(s): TSH, T4TOTAL, FREET4, T3FREE, THYROIDAB in the last 72 hours. Anemia Panel: No results for input(s): VITAMINB12, FOLATE, FERRITIN, TIBC, IRON, RETICCTPCT in the last 72 hours. Sepsis Labs: No results for input(s): PROCALCITON, LATICACIDVEN in the last 168 hours.  Recent Results (from the past 240 hour(s))  Resp Panel by RT-PCR (Flu A&B, Covid) Nasopharyngeal Swab     Status: None   Collection Time: 06/10/20  5:52 AM   Specimen: Nasopharyngeal Swab; Nasopharyngeal(NP) swabs in vial transport medium  Result Value Ref Range Status   SARS Coronavirus 2 by RT PCR NEGATIVE NEGATIVE Final    Comment: (NOTE) SARS-CoV-2 target nucleic acids are NOT DETECTED.  The SARS-CoV-2 RNA is generally detectable in upper respiratory specimens during the  acute phase of infection. The lowest concentration of SARS-CoV-2 viral copies this assay can detect is 138 copies/mL. A negative result does not preclude SARS-Cov-2 infection and should not be used as the sole basis for treatment or other patient management decisions. A negative result may occur with  improper specimen collection/handling, submission of specimen other than nasopharyngeal swab, presence of viral mutation(s) within the areas targeted by this assay, and inadequate number of viral copies(<138 copies/mL). A negative result must be combined with clinical observations, patient history, and epidemiological information. The expected result is Negative.  Fact Sheet for Patients:  BloggerCourse.com  Fact Sheet for Healthcare Providers:  SeriousBroker.it  This test is no t yet approved or cleared by the [place] FDA and  has been authorized for detection and/or diagnosis of SARS-CoV-2 by FDA under an Emergency Use Authorization (EUA). This EUA will remain  in effect (meaning this test can be used) for the duration of the COVID-19 declaration under Section 564(b)(1) of the Act, 21 U.S.C.section 360bbb-3(b)(1), unless the authorization is terminated  or revoked sooner.       Influenza A by PCR NEGATIVE NEGATIVE Final   Influenza B by PCR NEGATIVE NEGATIVE Final    Comment: (NOTE) The Xpert Xpress SARS-CoV-2/FLU/RSV plus assay is intended as an aid in the diagnosis of influenza from Nasopharyngeal swab specimens and should not be used as a sole basis for treatment. Nasal washings and aspirates are unacceptable for Xpert Xpress SARS-CoV-2/FLU/RSV testing.  Fact Sheet for Patients: BloggerCourse.com  Fact Sheet for Healthcare Providers: SeriousBroker.it  This test is not yet approved or cleared by the [place] FDA and has been authorized for detection and/or  diagnosis of SARS-CoV-2 by FDA under an Emergency Use Authorization (EUA). This EUA will remain in effect (meaning this test can be used) for the duration of the COVID-19 declaration under Section 564(b)(1) of the Act, 21 U.S.C. section 360bbb-3(b)(1), unless the authorization is terminated or revoked.  Performed at [facility], [address] [place], [place] [address]     Radiology Studies: No results found.  Scheduled Meds: . amLODipine  10 mg Oral Daily  . atenolol  25 mg Oral Daily  . atorvastatin  40 mg Oral Daily  . chlorhexidine  15 mL Mouth Rinse BID  . feeding supplement  237 mL  Oral TID BM  . heparin injection (subcutaneous)  5,000 Units Subcutaneous Q12H  . insulin aspart  0-15 Units Subcutaneous TID WC  . insulin aspart  0-5 Units Subcutaneous QHS  . insulin detemir  5 Units Subcutaneous Daily  . levothyroxine  25 mcg Oral Q0600  . mouth rinse  15 mL Mouth Rinse q12n4p  . memantine  10 mg Oral q morning  . multivitamin with minerals  1 tablet Oral Daily  . sodium chloride flush  10-40 mL Intracatheter Q12H  . sodium chloride  1 g Oral BID   Continuous Infusions: . sodium chloride 60 mL/hr at 06/15/20 0400  . methocarbamol (ROBAXIN) IV       LOS: 5 days    Time spent: 25 mins    Cipriano Bunker, MD [facility]   If 7PM-7AM, please contact night-coverage

## 2020-06-16 NOTE — TOC Progression Note (Signed)
Transition of Care [facility]) - Progression Note    Patient Details  Name: Suzanne Fox MRN: [id number] Date of Birth: 06-06-1929  Transition of [facility]) CM/SW Contact  Chana Bode, Student-Social Work Phone Number: 06/16/2020, 3:26 PM  Clinical Narrative:   Patient's insurance called and informed CSW that the birth date they have on file for patient is different from the one we have on file. MSW Student called patients sister who informed her that patients birthday is actually 07/01/1929 and not the 10-Dec-1929 date that is on her license. Informed insurance of this and awaiting insurance authorization.     Expected Discharge Plan: Skilled Nursing Facility Barriers to Discharge: Continued Medical Work up,Insurance Authorization  Expected Discharge Plan and Services Expected Discharge Plan: Skilled Nursing Facility       Living arrangements for the past 2 months: Single Family Home                                       Social Determinants of Health (SDOH) Interventions    Readmission Risk Interventions No flowsheet data found.

## 2020-06-16 NOTE — Progress Notes (Signed)
Occupational Therapy Treatment Patient Details Name: Suzanne Fox MRN: [id number] DOB: Nov 06, 1929 Today's Date: 06/16/2020    History of present illness 87 yo female former smoker presented 06/10/20 with Rt shoulder pain and Rt sided weakness.  Found to have spontaneous epidural hematoma with cervical spine cord compression and severe cervical spinal stensosis.  Required emergent C3-C6 decompression and fusion.  Remained on ventilator after surgery with extubation 06/12/20. PMH-PAD, Hypothyroidism, HTN, HLD, Hiatal hernia, DM type 2, Dementia, Arthritis   OT comments  OT treatment session with focus on bed mobility, static sitting balance at EOB, and RUE NMR. Patient continues to require +2 assist for bed mobility and LB ADLs at bed level. Patient continues to be limited by deficits listed below 2/2 diagnosis above and would benefit from continued acute OT services in prep for safe d/c to next level of care. Recommendation updated to SNF rehab. OT will continue to follow acutely.    Follow Up Recommendations  SNF;Supervision/Assistance - 24 hour    Equipment Recommendations  3 in 1 bedside commode;Tub/shower bench;Wheelchair (measurements OT);Wheelchair cushion (measurements OT);Hospital bed    Recommendations for Other Services      Precautions / Restrictions Precautions Precautions: Fall;Cervical Precaution Booklet Issued: No Required Braces or Orthoses: Cervical Brace Cervical Brace: Soft collar (per MD can use soft collar to help support neck if needed) Restrictions Weight Bearing Restrictions: No       Mobility Bed Mobility Overal bed mobility: Needs Assistance Bed Mobility: Rolling;Sidelying to Sit;Sit to Sidelying Rolling: Mod assist (To R.) Sidelying to sit: Mod assist;+2 for safety/equipment   Sit to supine: +2 for physical assistance;Max assist        Transfers Overall transfer level: Needs assistance               General transfer comment: Deferred     Balance Overall balance assessment: Needs assistance Sitting-balance support: Bilateral upper extremity supported;Feet supported Sitting balance-Leahy Scale: Poor   Postural control: Posterior lean;Right lateral lean                                 ADL either performed or assessed with clinical judgement   ADL Overall ADL's : Needs assistance/impaired Eating/Feeding: Moderate assistance Eating/Feeding Details (indicate cue type and reason): After set-up of meal tray patient requires assist to progress food from plate to mouth. L handed at baseline. Grooming: Moderate assistance;Sitting Grooming Details (indicate cue type and reason): Able to wash face with assist to maintain static sitting balance only.                                     Vision       Perception     Praxis      Cognition Arousal/Alertness: Awake/alert Behavior During Therapy: WFL for tasks assessed/performed Overall Cognitive Status: History of cognitive impairments - at baseline                                 General Comments: Hx of dementia. Able to state name and DOB. Orientented to place. Cannot recall situation or time. Able to correctly identify day of the week and month with question cues. Not oriented to year.        Exercises Exercises: General Upper Extremity General Exercises - Upper Extremity Shoulder Flexion:  AAROM;Right;5 reps Elbow Flexion: AAROM;Right;5 reps Elbow Extension: AROM;Right;5 reps Wrist Flexion: AROM;Right;5 reps Wrist Extension: AAROM;Right;5 reps Digit Composite Flexion: AAROM;Right;5 reps Composite Extension: AAROM;Right;5 reps   Shoulder Instructions       General Comments Spiritual leader present at bedside from church.    Pertinent Vitals/ Pain       Pain Assessment: Faces Faces Pain Scale: Hurts even more Pain Location: rt shoulder/UE/Neck Pain Descriptors / Indicators: Burning;Discomfort Pain Intervention(s):  Limited activity within patient's tolerance;Monitored during session  Home Living                                          Prior Functioning/Environment              Frequency  Min 2X/week        Progress Toward Goals  OT Goals(current goals can now be found in the care plan section)  Progress towards OT goals: Progressing toward goals  Acute Rehab OT Goals Patient Stated Goal: regain her strength and go home OT Goal Formulation: With patient Time For Goal Achievement: 06/28/20 Potential to Achieve Goals: Good ADL Goals Pt Will Perform Eating: with set-up;with supervision;sitting Pt Will Perform Grooming: sitting;with supervision;with set-up Pt Will Perform Upper Body Bathing: with min assist;sitting Additional ADL Goal #1: Pt will maintain midline postural control sitting EOB with minguard A in preparation for ADL Additional ADL Goal #2: Pt will use RUE as functional A with mod A during ADL tasks  Plan Discharge plan needs to be updated;Frequency remains appropriate    Co-evaluation                 AM-PAC OT "6 Clicks" Daily Activity     Outcome Measure   Help from another person eating meals?: A Lot Help from another person taking care of personal grooming?: A Lot Help from another person toileting, which includes using toliet, bedpan, or urinal?: Total Help from another person bathing (including washing, rinsing, drying)?: A Lot Help from another person to put on and taking off regular upper body clothing?: A Lot Help from another person to put on and taking off regular lower body clothing?: Total 6 Click Score: 10    End of Session    OT Visit Diagnosis: Unsteadiness on feet (R26.81);Other abnormalities of gait and mobility (R26.89);Muscle weakness (generalized) (M62.81);Other symptoms and signs involving cognitive function;Pain Pain - Right/Left: Right Pain - part of body: Arm (cervical neck and shoulder)   Activity Tolerance  Patient tolerated treatment well   Patient Left in bed;with call bell/phone within reach;with bed alarm set;with family/visitor present (Bed in chair position.)   Nurse Communication Mobility status        Time: 1202-1223 OT Time Calculation (min): 21 min  Charges: OT General Charges $OT Visit: 1 Visit OT Treatments $Therapeutic Activity: 8-22 mins  Iniko Robles H. OTR/L Supplemental OT, Department of rehab services [phone]   Larnie Heart R H. 06/16/2020, 12:42 PM

## 2020-06-17 DIAGNOSIS — I621 Nontraumatic extradural hemorrhage: Secondary | ICD-10-CM | POA: Diagnosis not present

## 2020-06-17 LAB — BASIC METABOLIC PANEL WITH GFR
Anion gap: 4 — ABNORMAL LOW (ref 5–15)
BUN: 9 mg/dL (ref 8–23)
CO2: 27 mmol/L (ref 22–32)
Calcium: 8.6 mg/dL — ABNORMAL LOW (ref 8.9–10.3)
Chloride: 101 mmol/L (ref 98–111)
Creatinine, Ser: 0.48 mg/dL (ref 0.44–1.00)
GFR, Estimated: >60 mL/min
Glucose, Bld: 134 mg/dL — ABNORMAL HIGH (ref 70–99)
Potassium: 3.6 mmol/L (ref 3.5–5.1)
Sodium: 132 mmol/L — ABNORMAL LOW (ref 135–145)

## 2020-06-17 LAB — GLUCOSE, CAPILLARY
Glucose-Capillary: 125 mg/dL — ABNORMAL HIGH (ref 70–99)
Glucose-Capillary: 119 mg/dL — ABNORMAL HIGH (ref 70–99)
Glucose-Capillary: 147 mg/dL — ABNORMAL HIGH (ref 70–99)
Glucose-Capillary: 173 mg/dL — ABNORMAL HIGH (ref 70–99)
Glucose-Capillary: 205 mg/dL — ABNORMAL HIGH (ref 70–99)

## 2020-06-17 LAB — PREPARE RBC (CROSSMATCH)

## 2020-06-17 LAB — HEMOGLOBIN AND HEMATOCRIT, BLOOD
HCT: 22.6 % — ABNORMAL LOW (ref 36.0–46.0)
Hemoglobin: 7.4 g/dL — ABNORMAL LOW (ref 12.0–15.0)

## 2020-06-17 MED ORDER — HEPARIN SODIUM (PORCINE) 5000 UNIT/ML IJ SOLN
5000.0000 [IU] | Freq: Three times a day (TID) | INTRAMUSCULAR | Status: DC
  Administered 2020-06-17 – 2020-06-18 (×2): 5000 [IU] via SUBCUTANEOUS
  Filled 2020-06-17 (×2): qty 1

## 2020-06-17 MED ORDER — SODIUM CHLORIDE 0.9% IV SOLUTION: Freq: Once | INTRAVENOUS | Status: AC

## 2020-06-17 MED ORDER — CHLORHEXIDINE GLUCONATE CLOTH 2 % EX PADS
6.0000 | MEDICATED_PAD | Freq: Every day | CUTANEOUS | Status: DC
  Administered 2020-06-17 – 2020-06-18 (×2): 6 via TOPICAL

## 2020-06-17 NOTE — Care Management Important Message (Signed)
Important Message  Patient Details  Name: Suzanne Fox MRN: [id number] Date of Birth: 1929/04/30   Medicare Important Message Given:  Yes     Dorena Bodo 06/17/2020, 2:28 PM

## 2020-06-17 NOTE — Progress Notes (Signed)
   Providing Compassionate, Quality Care - Together  NEUROSURGERY PROGRESS NOTE   S: No issues overnight.   O: EXAM:  BP (!) 146/55 (BP Location: Left Arm)   Pulse 63   Temp 99.2 F (37.3 C) (Oral)   Resp 20   Ht 5' (1.524 m)   Wt 65.3 kg   SpO2 98%   BMI 28.12 kg/m   Awake, alert, oriented  Speech fluent, appropriate Eyes open spontaneously, PERRL Face symmetric Wound clean dry and intact Left arm, leg 4+/5 throughout Right arm1-2/5prox, distal 3-4/5, right lower extremity2/5   ASSESSMENT: 87 y.o.femalewith   1.Cervical stenosis with cord signal change due to spontaneous epidural hematoma, cervical spine  -Status post C2-6 decompression andC3-34fusion on 06/10/2020  PLAN: -SQH for dvt ppx -Pain control -SCDs -GI prophylaxis -rehabpending -soft collar for comfort   Thank you for allowing me to participate in this patient's care.  Please do not hesitate to call with questions or concerns.   Monia Pouch, DO Neurosurgeon [facility] Cell: [phone]

## 2020-06-17 NOTE — TOC Progression Note (Signed)
Transition of Care [facility]) - Progression Note    Patient Details  Name: Suzanne Fox MRN: [id number] Date of Birth: 1930/03/23  Transition of [facility]) CM/SW Contact  Baldemar Lenis, [place] Phone Number: 06/17/2020, 11:07 AM  Clinical Narrative:   CSW notified by SNF that insurance authorization has been approved. CSW updated MD, but patient not medically stable for DC today, hopeful for tomorrow if improved. CSW updated SNF and will follow.    Expected Discharge Plan: Skilled Nursing Facility Barriers to Discharge: Continued Medical Work up  Expected Discharge Plan and Services Expected Discharge Plan: Skilled Nursing Facility       Living arrangements for the past 2 months: Single Family Home                                       Social Determinants of Health (SDOH) Interventions    Readmission Risk Interventions No flowsheet data found.

## 2020-06-17 NOTE — Progress Notes (Signed)
PROGRESS NOTE    KERRIANNE JENG  KDX:[id number] DOB: 1929/11/22 DOA: 06/10/2020 PCP: Georgianne Fick, MD   Brief Narrative: This 87 years old female with PMH significant for tobacco abuse, PAD, hypothyroidism, hypertension, hyperlipidemia, diabetes, dementia, arthritis presents in the ER with right shoulder pain and right-sided weakness.  She is found to have  spontaneous epidural hematoma with cervical cord compression and severe cervical spinal stenosis.  Neurosurgery was consulted,  patient underwent emergent C3-C6 decompression and fusion.  Patient was initially admitted in ICU requiring ventilator support.  She was successfully extubated on 3/19.  PCCM pickup 3/22.  Neurosurgery recommended continue supportive care pain control.  Assessment & Plan:   Principal Problem:   Nontraumatic epidural hematoma (HCC) Active Problems:   Right shoulder pain   Dementia without behavioral disturbance (HCC)   Hypertension   Hyperlipidemia   Hypothyroidism (acquired)   Epidural hematoma (HCC)   Protein-calorie malnutrition, severe  Right-sided weakness sec. to spontaneous epidural hematoma and cervical canal stenosis. Patient underwent emergent decompression and cervical  spinal fusion on 3/17. Neurosurgery is following,  recommended continue supportive care. Adequate pain control, aspiration precautions, mobilize as tolerated. PT and OT recommendation : SNF  Neurosurgery signed off, recommended outpatient follow-up.  Hyponatremia: > Improving This could be secondary to neurosurgery. Continue salt tablets. Recheck labs in the morning. 128> 132   Right shoulder pain secondary to subacromial bursitis  -MRI shoulder ; severe glenohumeral osteoarthritis, massive volume of subacromial/subdeltoid fluid consistent with bursitis and a complete tear of the long head of biceps from the superior labrum. Adequate pain control, continue supportive care.  Ice as needed. Consulted orthopedics.   Recommended no surgical intervention.   Anemia of chronic illness and chronic disease Hemoglobin down trended to 7.4 Hemoglobin PTA 11.4. Transfuse 1 PRBC,  follow posttransfusion CBC Monitor for signs of bleeding   Hx of dementia. Continue home Namenda Delirium precautions .  Essential hypertension: Continue Norvasc, atenolol, chlorthalidone, Catapres and lisinopril.  Hyperlipidemia : continue Lipitor  Hypothyroidism. Continue home Synthroid   DM type 2 poorly controlled with hyperglycemia Continue SSI and Levimir   Acute urinary retention : Patient continued to have urinary retention requiring in and out cath. May consider indwelling Foley catheter if she continues to retain urine.   DVT prophylaxis: Heparin subcu Code Status: Full code Family Communication: No family at bedside  disposition Plan:   Status is: Inpatient  Remains inpatient appropriate because:Inpatient level of care appropriate due to severity of illness   Dispo: The patient is from: Home              Anticipated d/c is to: SNF on 3/25              Patient currently is not medically stable to d/c.   Difficult to place patient No   Consultants:   Neurosurgery  Procedures: Emergent C3 C6 decompression and fusion.   Antimicrobials:  Anti-infectives (From admission, onward)   Start     Dose/Rate Route Frequency Ordered Stop   06/11/20 0200  ceFAZolin (ANCEF) IVPB 2g/100 mL premix        2 g 200 mL/hr over 30 Minutes Intravenous Every 8 hours 06/10/20 2252 06/11/20 0945   06/10/20 1716  ceFAZolin (ANCEF) 2-4 GM/100ML-% IVPB       Note to Pharmacy: Payton Emerald   : cabinet override      06/10/20 1716 06/11/20 0207   06/10/20 1621  ceFAZolin (ANCEF) IVPB 2g/100 mL premix  2 g 200 mL/hr over 30 Minutes Intravenous 30 min pre-op 06/10/20 1621 06/10/20 1800      Subjective: Patient was seen and examined at bedside.  Overnight events noted.   She reports feeling better, reports  right shoulder and back pain improved. She is alert and oriented x 3.  Patient has participated in physical therapy,  reports she is feeling cold,  back pain is persistent but is controlled with pain medication  Objective: Vitals:   06/17/20 0758 06/17/20 1113 06/17/20 1319 06/17/20 1357  BP: (!) 146/55 (!) 175/133 (!) 127/53 (!) 144/50  Pulse: 63 72 66 66  Resp: 20 20 20 20   Temp: 99.2 F (37.3 C) 98.8 F (37.1 C) 99 F (37.2 C) 98.7 F (37.1 C)  TempSrc: Oral Oral Oral Oral  SpO2: 98% 100% 100% 100%  Weight:      Height:        Intake/Output Summary (Last 24 hours) at 06/17/2020 1450 Last data filed at 06/17/2020 0400 Gross per 24 hour  Intake --  Output 1300 ml  Net -1300 ml   Filed Weights   06/14/20 0500 06/15/20 0500 06/16/20 0500  Weight: 52.5 kg 52.3 kg 65.3 kg    Examination:  General exam: Appears calm and comfortable, does not appear in any acute distress. Respiratory system: Clear to auscultation. Respiratory effort normal. Cardiovascular system: S1 & S2 heard, RRR. No JVD, murmurs, rubs, gallops or clicks. No pedal edema. Gastrointestinal system: Abdomen is nondistended, soft and nontender. No organomegaly or masses felt. Normal bowel sounds heard. Central nervous system: Alert and oriented. Right arm weakness, poor grip. Extremities: .  No edema, no cyanosis, no clubbing. Skin: No rashes, lesions or ulcers Psychiatry: Judgement and insight appear normal. Mood & affect appropriate.     Data Reviewed: I have personally reviewed following labs and imaging studies  CBC: Recent Labs  Lab 06/11/20 0437 06/12/20 0418 06/14/20 0345 06/15/20 0821 06/17/20 0424  WBC 7.4 11.8* 7.4  --   --   HGB 8.6* 8.4* 7.6* 7.7* 7.4*  HCT 25.4* 25.5* 23.1* 24.0* 22.6*  MCV 80.6 81.7 83.4  --   --   PLT 306 292 224  --   --    Basic Metabolic Panel: Recent Labs  Lab 06/12/20 0418 06/12/20 1942 06/13/20 0359 06/13/20 1307 06/14/20 0345 06/15/20 0219  06/17/20 0424  NA 130*  --  129* 128* 129* 128* 132*  K 3.0*  --  2.8* 3.9 4.1 3.6 3.6  CL 97*  --  99 99 99 96* 101  CO2 22  --  26 26 24 26 27   GLUCOSE 259*  --  83 142* 128* 196* 134*  BUN 17  --  8 7* <5* 6* 9  CREATININE 0.82  --  0.50 0.45 0.46 0.53 0.48  CALCIUM 8.2*  --  8.2* 8.1* 8.3* 8.3* 8.6*  MG 1.0* 2.3 2.1  --  1.5* 1.8  --   PHOS 2.3*  --  2.4*  --  2.2* 2.7  --    GFR: Estimated Creatinine Clearance: 41 mL/min (by C-G formula based on SCr of 0.48 mg/dL). Liver Function Tests: No results for input(s): AST, ALT, ALKPHOS, BILITOT, PROT, ALBUMIN in the last 168 hours. No results for input(s): LIPASE, AMYLASE in the last 168 hours. No results for input(s): AMMONIA in the last 168 hours. Coagulation Profile: Recent Labs  Lab 06/10/20 1631  INR 1.1   Cardiac Enzymes: No results for input(s): CKTOTAL, CKMB, CKMBINDEX, TROPONINI in  the last 168 hours. BNP (last 3 results) No results for input(s): PROBNP in the last 8760 hours. HbA1C: No results for input(s): HGBA1C in the last 72 hours. CBG: Recent Labs  Lab 06/16/20 1930 06/16/20 2147 06/17/20 0614 06/17/20 0803 06/17/20 1115  GLUCAP 170* 221* 125* 119* 147*   Lipid Profile: No results for input(s): CHOL, HDL, LDLCALC, TRIG, CHOLHDL, LDLDIRECT in the last 72 hours. Thyroid Function Tests: No results for input(s): TSH, T4TOTAL, FREET4, T3FREE, THYROIDAB in the last 72 hours. Anemia Panel: No results for input(s): VITAMINB12, FOLATE, FERRITIN, TIBC, IRON, RETICCTPCT in the last 72 hours. Sepsis Labs: No results for input(s): PROCALCITON, LATICACIDVEN in the last 168 hours.  Recent Results (from the past 240 hour(s))  Resp Panel by RT-PCR (Flu A&B, Covid) Nasopharyngeal Swab     Status: None   Collection Time: 06/10/20  5:52 AM   Specimen: Nasopharyngeal Swab; Nasopharyngeal(NP) swabs in vial transport medium  Result Value Ref Range Status   SARS Coronavirus 2 by RT PCR NEGATIVE NEGATIVE Final    Comment:  (NOTE) SARS-CoV-2 target nucleic acids are NOT DETECTED.  The SARS-CoV-2 RNA is generally detectable in upper respiratory specimens during the acute phase of infection. The lowest concentration of SARS-CoV-2 viral copies this assay can detect is 138 copies/mL. A negative result does not preclude SARS-Cov-2 infection and should not be used as the sole basis for treatment or other patient management decisions. A negative result may occur with  improper specimen collection/handling, submission of specimen other than nasopharyngeal swab, presence of viral mutation(s) within the areas targeted by this assay, and inadequate number of viral copies(<138 copies/mL). A negative result must be combined with clinical observations, patient history, and epidemiological information. The expected result is Negative.  Fact Sheet for Patients:  BloggerCourse.com  Fact Sheet for Healthcare Providers:  SeriousBroker.it  This test is no t yet approved or cleared by the [place] FDA and  has been authorized for detection and/or diagnosis of SARS-CoV-2 by FDA under an Emergency Use Authorization (EUA). This EUA will remain  in effect (meaning this test can be used) for the duration of the COVID-19 declaration under Section 564(b)(1) of the Act, 21 U.S.C.section 360bbb-3(b)(1), unless the authorization is terminated  or revoked sooner.       Influenza A by PCR NEGATIVE NEGATIVE Final   Influenza B by PCR NEGATIVE NEGATIVE Final    Comment: (NOTE) The Xpert Xpress SARS-CoV-2/FLU/RSV plus assay is intended as an aid in the diagnosis of influenza from Nasopharyngeal swab specimens and should not be used as a sole basis for treatment. Nasal washings and aspirates are unacceptable for Xpert Xpress SARS-CoV-2/FLU/RSV testing.  Fact Sheet for Patients: BloggerCourse.com  Fact Sheet for Healthcare  Providers: SeriousBroker.it  This test is not yet approved or cleared by the [place] FDA and has been authorized for detection and/or diagnosis of SARS-CoV-2 by FDA under an Emergency Use Authorization (EUA). This EUA will remain in effect (meaning this test can be used) for the duration of the COVID-19 declaration under Section 564(b)(1) of the Act, 21 U.S.C. section 360bbb-3(b)(1), unless the authorization is terminated or revoked.  Performed at [facility], [address], [place], [place] [address]   SARS CORONAVIRUS 2 (TAT 6-24 HRS) Nasopharyngeal Nasopharyngeal Swab     Status: None   Collection Time: 06/16/20  4:12 PM   Specimen: Nasopharyngeal Swab  Result Value Ref Range Status   SARS Coronavirus 2 NEGATIVE NEGATIVE Final    Comment: (NOTE)  SARS-CoV-2 target nucleic acids are NOT DETECTED.  The SARS-CoV-2 RNA is generally detectable in upper and lower respiratory specimens during the acute phase of infection. Negative results do not preclude SARS-CoV-2 infection, do not rule out co-infections with other pathogens, and should not be used as the sole basis for treatment or other patient management decisions. Negative results must be combined with clinical observations, patient history, and epidemiological information. The expected result is Negative.  Fact Sheet for Patients: HairSlick.nohttps://www.fda.gov/media/138098/download  Fact Sheet for Healthcare Providers: quierodirigir.comhttps://www.fda.gov/media/138095/download  This test is not yet approved or cleared by the [place] FDA and  has been authorized for detection and/or diagnosis of SARS-CoV-2 by FDA under an Emergency Use Authorization (EUA). This EUA will remain  in effect (meaning this test can be used) for the duration of the COVID-19 declaration under Se ction 564(b)(1) of the Act, 21 U.S.C. section 360bbb-3(b)(1), unless the authorization is terminated or revoked sooner.  Performed at  [facility], [address] [place], [place] [id number]     Radiology Studies: No results found.  Scheduled Meds: . amLODipine  10 mg Oral Daily  . atenolol  25 mg Oral Daily  . atorvastatin  40 mg Oral Daily  . chlorhexidine  15 mL Mouth Rinse BID  . feeding supplement  237 mL Oral TID BM  . heparin injection (subcutaneous)  5,000 Units Subcutaneous Q8H  . insulin aspart  0-15 Units Subcutaneous TID WC  . insulin aspart  0-5 Units Subcutaneous QHS  . insulin detemir  5 Units Subcutaneous Daily  . levothyroxine  25 mcg Oral Q0600  . mouth rinse  15 mL Mouth Rinse q12n4p  . memantine  10 mg Oral q morning  . multivitamin with minerals  1 tablet Oral Daily  . sodium chloride  1 g Oral BID   Continuous Infusions: . sodium chloride 60 mL/hr at 06/17/20 1124  . methocarbamol (ROBAXIN) IV       LOS: 6 days    Time spent: 25 mins    Cipriano BunkerPARDEEP Ocean Kearley, MD [facility]   If 7PM-7AM, please contact night-coverage

## 2020-06-17 NOTE — Progress Notes (Signed)
Bladder re-scanned at 0300, read 243, in and out cath done at 0400, drained of clear urine, peri care done and pt repositoned in bed. Obasogie-Asidi, Suzanne Fox

## 2020-06-18 DIAGNOSIS — R627 Adult failure to thrive: Secondary | ICD-10-CM | POA: Diagnosis not present

## 2020-06-18 DIAGNOSIS — D72829 Elevated white blood cell count, unspecified: Secondary | ICD-10-CM | POA: Diagnosis not present

## 2020-06-18 DIAGNOSIS — E876 Hypokalemia: Secondary | ICD-10-CM | POA: Diagnosis not present

## 2020-06-18 DIAGNOSIS — Z48811 Encounter for surgical aftercare following surgery on the nervous system: Secondary | ICD-10-CM | POA: Diagnosis not present

## 2020-06-18 DIAGNOSIS — Z7982 Long term (current) use of aspirin: Secondary | ICD-10-CM | POA: Diagnosis not present

## 2020-06-18 DIAGNOSIS — I10 Essential (primary) hypertension: Secondary | ICD-10-CM | POA: Diagnosis present

## 2020-06-18 DIAGNOSIS — T83511A Infection and inflammatory reaction due to indwelling urethral catheter, initial encounter: Secondary | ICD-10-CM | POA: Diagnosis present

## 2020-06-18 DIAGNOSIS — Z741 Need for assistance with personal care: Secondary | ICD-10-CM | POA: Diagnosis not present

## 2020-06-18 DIAGNOSIS — R55 Syncope and collapse: Secondary | ICD-10-CM | POA: Diagnosis not present

## 2020-06-18 DIAGNOSIS — R9431 Abnormal electrocardiogram [ECG] [EKG]: Secondary | ICD-10-CM | POA: Diagnosis not present

## 2020-06-18 DIAGNOSIS — R5381 Other malaise: Secondary | ICD-10-CM | POA: Diagnosis not present

## 2020-06-18 DIAGNOSIS — I621 Nontraumatic extradural hemorrhage: Secondary | ICD-10-CM | POA: Diagnosis not present

## 2020-06-18 DIAGNOSIS — E119 Type 2 diabetes mellitus without complications: Secondary | ICD-10-CM | POA: Diagnosis not present

## 2020-06-18 DIAGNOSIS — E860 Dehydration: Secondary | ICD-10-CM | POA: Diagnosis not present

## 2020-06-18 DIAGNOSIS — R339 Retention of urine, unspecified: Secondary | ICD-10-CM | POA: Diagnosis not present

## 2020-06-18 DIAGNOSIS — Z981 Arthrodesis status: Secondary | ICD-10-CM | POA: Diagnosis not present

## 2020-06-18 DIAGNOSIS — E440 Moderate protein-calorie malnutrition: Secondary | ICD-10-CM | POA: Diagnosis present

## 2020-06-18 DIAGNOSIS — R531 Weakness: Secondary | ICD-10-CM | POA: Diagnosis not present

## 2020-06-18 DIAGNOSIS — Y92129 Unspecified place in nursing home as the place of occurrence of the external cause: Secondary | ICD-10-CM | POA: Diagnosis not present

## 2020-06-18 DIAGNOSIS — Y846 Urinary catheterization as the cause of abnormal reaction of the patient, or of later complication, without mention of misadventure at the time of the procedure: Secondary | ICD-10-CM | POA: Diagnosis present

## 2020-06-18 DIAGNOSIS — Z79890 Hormone replacement therapy (postmenopausal): Secondary | ICD-10-CM | POA: Diagnosis not present

## 2020-06-18 DIAGNOSIS — N390 Urinary tract infection, site not specified: Secondary | ICD-10-CM | POA: Diagnosis present

## 2020-06-18 DIAGNOSIS — M1990 Unspecified osteoarthritis, unspecified site: Secondary | ICD-10-CM | POA: Diagnosis present

## 2020-06-18 DIAGNOSIS — M79601 Pain in right arm: Secondary | ICD-10-CM | POA: Diagnosis not present

## 2020-06-18 DIAGNOSIS — R404 Transient alteration of awareness: Secondary | ICD-10-CM | POA: Diagnosis not present

## 2020-06-18 DIAGNOSIS — Z794 Long term (current) use of insulin: Secondary | ICD-10-CM | POA: Diagnosis not present

## 2020-06-18 DIAGNOSIS — R8290 Unspecified abnormal findings in urine: Secondary | ICD-10-CM | POA: Diagnosis not present

## 2020-06-18 DIAGNOSIS — L89626 Pressure-induced deep tissue damage of left heel: Secondary | ICD-10-CM | POA: Diagnosis not present

## 2020-06-18 DIAGNOSIS — E039 Hypothyroidism, unspecified: Secondary | ICD-10-CM | POA: Diagnosis present

## 2020-06-18 DIAGNOSIS — Z66 Do not resuscitate: Secondary | ICD-10-CM | POA: Diagnosis present

## 2020-06-18 DIAGNOSIS — E875 Hyperkalemia: Secondary | ICD-10-CM | POA: Diagnosis not present

## 2020-06-18 DIAGNOSIS — D649 Anemia, unspecified: Secondary | ICD-10-CM | POA: Diagnosis not present

## 2020-06-18 DIAGNOSIS — E222 Syndrome of inappropriate secretion of antidiuretic hormone: Secondary | ICD-10-CM | POA: Diagnosis present

## 2020-06-18 DIAGNOSIS — R5383 Other fatigue: Secondary | ICD-10-CM | POA: Diagnosis not present

## 2020-06-18 DIAGNOSIS — M4802 Spinal stenosis, cervical region: Secondary | ICD-10-CM | POA: Diagnosis not present

## 2020-06-18 DIAGNOSIS — M7989 Other specified soft tissue disorders: Secondary | ICD-10-CM | POA: Diagnosis not present

## 2020-06-18 DIAGNOSIS — Z515 Encounter for palliative care: Secondary | ICD-10-CM | POA: Diagnosis not present

## 2020-06-18 DIAGNOSIS — R252 Cramp and spasm: Secondary | ICD-10-CM | POA: Diagnosis not present

## 2020-06-18 DIAGNOSIS — R680 Hypothermia, not associated with low environmental temperature: Secondary | ICD-10-CM | POA: Diagnosis present

## 2020-06-18 DIAGNOSIS — Z87891 Personal history of nicotine dependence: Secondary | ICD-10-CM | POA: Diagnosis not present

## 2020-06-18 DIAGNOSIS — M4322 Fusion of spine, cervical region: Secondary | ICD-10-CM | POA: Diagnosis not present

## 2020-06-18 DIAGNOSIS — Z7401 Bed confinement status: Secondary | ICD-10-CM | POA: Diagnosis not present

## 2020-06-18 DIAGNOSIS — Z20822 Contact with and (suspected) exposure to covid-19: Secondary | ICD-10-CM | POA: Diagnosis present

## 2020-06-18 DIAGNOSIS — R0902 Hypoxemia: Secondary | ICD-10-CM | POA: Diagnosis not present

## 2020-06-18 DIAGNOSIS — J189 Pneumonia, unspecified organism: Secondary | ICD-10-CM | POA: Diagnosis not present

## 2020-06-18 DIAGNOSIS — M2550 Pain in unspecified joint: Secondary | ICD-10-CM | POA: Diagnosis not present

## 2020-06-18 DIAGNOSIS — E871 Hypo-osmolality and hyponatremia: Secondary | ICD-10-CM | POA: Diagnosis not present

## 2020-06-18 DIAGNOSIS — R64 Cachexia: Secondary | ICD-10-CM | POA: Diagnosis present

## 2020-06-18 DIAGNOSIS — R519 Headache, unspecified: Secondary | ICD-10-CM | POA: Diagnosis not present

## 2020-06-18 DIAGNOSIS — E785 Hyperlipidemia, unspecified: Secondary | ICD-10-CM | POA: Diagnosis present

## 2020-06-18 DIAGNOSIS — Z7409 Other reduced mobility: Secondary | ICD-10-CM | POA: Diagnosis not present

## 2020-06-18 DIAGNOSIS — M6281 Muscle weakness (generalized): Secondary | ICD-10-CM | POA: Diagnosis not present

## 2020-06-18 DIAGNOSIS — R4182 Altered mental status, unspecified: Secondary | ICD-10-CM | POA: Diagnosis not present

## 2020-06-18 DIAGNOSIS — G9341 Metabolic encephalopathy: Secondary | ICD-10-CM | POA: Diagnosis present

## 2020-06-18 DIAGNOSIS — Z79899 Other long term (current) drug therapy: Secondary | ICD-10-CM | POA: Diagnosis not present

## 2020-06-18 DIAGNOSIS — J90 Pleural effusion, not elsewhere classified: Secondary | ICD-10-CM | POA: Diagnosis not present

## 2020-06-18 DIAGNOSIS — R609 Edema, unspecified: Secondary | ICD-10-CM | POA: Diagnosis not present

## 2020-06-18 DIAGNOSIS — I959 Hypotension, unspecified: Secondary | ICD-10-CM | POA: Diagnosis not present

## 2020-06-18 DIAGNOSIS — Z1621 Resistance to vancomycin: Secondary | ICD-10-CM | POA: Diagnosis present

## 2020-06-18 DIAGNOSIS — E43 Unspecified severe protein-calorie malnutrition: Secondary | ICD-10-CM | POA: Diagnosis not present

## 2020-06-18 DIAGNOSIS — E1165 Type 2 diabetes mellitus with hyperglycemia: Secondary | ICD-10-CM | POA: Diagnosis present

## 2020-06-18 DIAGNOSIS — T502X5A Adverse effect of carbonic-anhydrase inhibitors, benzothiadiazides and other diuretics, initial encounter: Secondary | ICD-10-CM | POA: Diagnosis present

## 2020-06-18 DIAGNOSIS — R001 Bradycardia, unspecified: Secondary | ICD-10-CM | POA: Diagnosis not present

## 2020-06-18 DIAGNOSIS — F0390 Unspecified dementia without behavioral disturbance: Secondary | ICD-10-CM | POA: Diagnosis present

## 2020-06-18 LAB — TYPE AND SCREEN
ABO/RH(D): B POS
Antibody Screen: NEGATIVE
Unit division: 0

## 2020-06-18 LAB — BPAM RBC
Blood Product Expiration Date: 202204142359
ISSUE DATE / TIME: 202203241331
Unit Type and Rh: 7300

## 2020-06-18 LAB — BASIC METABOLIC PANEL
Anion gap: 7 (ref 5–15)
BUN: 8 mg/dL (ref 8–23)
CO2: 25 mmol/L (ref 22–32)
Calcium: 8.7 mg/dL — ABNORMAL LOW (ref 8.9–10.3)
Chloride: 97 mmol/L — ABNORMAL LOW (ref 98–111)
Creatinine, Ser: 0.47 mg/dL (ref 0.44–1.00)
GFR, Estimated: >60 mL/min (ref >60)
Glucose, Bld: 147 mg/dL — ABNORMAL HIGH (ref 70–99)
Potassium: 3.7 mmol/L (ref 3.5–5.1)
Sodium: 129 mmol/L — ABNORMAL LOW (ref 135–145)

## 2020-06-18 LAB — CBC
HCT: 28.8 % — ABNORMAL LOW (ref 36.0–46.0)
Hemoglobin: 9.5 g/dL — ABNORMAL LOW (ref 12.0–15.0)
MCH: 27.1 pg (ref 26.0–34.0)
MCHC: 33 g/dL (ref 30.0–36.0)
MCV: 82.3 fL (ref 80.0–100.0)
Platelets: 272 10*3/uL (ref 150–400)
RBC: 3.5 MIL/uL — ABNORMAL LOW (ref 3.87–5.11)
RDW: 14.3 % (ref 11.5–15.5)
WBC: 9.5 10*3/uL (ref 4.0–10.5)
nRBC: 0 % (ref 0.0–0.2)

## 2020-06-18 LAB — GLUCOSE, CAPILLARY
Glucose-Capillary: 134 mg/dL — ABNORMAL HIGH (ref 70–99)
Glucose-Capillary: 139 mg/dL — ABNORMAL HIGH (ref 70–99)

## 2020-06-18 MED ORDER — METHOCARBAMOL 500 MG PO TABS: 500.0000 mg | ORAL_TABLET | Freq: Four times a day (QID) | ORAL | 0 refills | Status: DC | PRN

## 2020-06-18 MED ORDER — SODIUM CHLORIDE 1 G PO TABS: 1.0000 g | ORAL_TABLET | Freq: Two times a day (BID) | ORAL | 0 refills | Status: DC

## 2020-06-18 NOTE — Discharge Summary (Signed)
Physician Discharge Summary  [facility] TDD:[id number] DOB: 12-31-1929 DOA: 06/10/2020  PCP: Georgianne Fick, MD  Admit date: 06/10/2020 .  Discharge date: 06/18/2020  Admitted From: Home.  Disposition:  SNF   Recommendations for Outpatient Follow-up:  1. Follow up with PCP in 1-2 weeks. 2. Please obtain BMP/CBC in one week. 3.   Advised to follow-up with Neurosurgery in 3 to 4 weeks.   4.   Advised to take sodium chloride tablets 2 times daily for next few days. 5.  Advised to do voiding trial and remove the Foley catheter if She urinates.  Home Health:None.  Equipment/Devices: None  Discharge Condition: Stable CODE STATUS:Full code Diet recommendation: Heart Healthy   Brief [facility] Course: This 87 years old female with PMH significant for tobacco abuse, PAD, Hypothyroidism, Hypertension, Hyperlipidemia, diabetes, dementia, arthritis presents in the ER with right shoulder pain and right-sided weakness. She is found to have  spontaneous epidural hematoma with cervical cord compression and severe cervical canal stenosis.  Neurosurgery was consulted,  Patient underwent emergent C3-C6 decompression and fusion. Patient was initially admitted in ICU requiring ventilator support.  She was successfully extubated on 3/19.  PCCM pickup 3/22.  She was moved out of the ICU. Neurosurgery recommended continue supportive care, adequate pain control.  PT and OT recommended skilled nursing facility for rehab.  Patient hemoglobin has dropped to 7.4, She was given 1 unit of packed red blood cells,  posttransfusion hemoglobin remains 9.7.  Patient has participated in physical therapy.  Patient has developed urinary retention post surgery.  Patient has a Foley catheter and needs a voiding trial to remove the catheter.  Patient sodium has decreased that could be secondary to surgery.  Patient started on sodium tablets,  advised to continue for few days.  Patient is cleared from neurosurgery and  follow-up outpatient.  Patient is being discharged to nursing home.  She continues to have right-sided weakness which will improve with physical therapy.  She was managed for below problems.  Discharge Diagnoses:  Principal Problem:   Nontraumatic epidural hematoma (HCC) Active Problems:   Right shoulder pain   Dementia without behavioral disturbance (HCC)   Hypertension   Hyperlipidemia   Hypothyroidism (acquired)   Epidural hematoma (HCC)   Protein-calorie malnutrition, severe  Right-sided weakness sec. to spontaneous epidural hematoma and cervical canal stenosis. Patient underwent emergent decompression and cervical  spinal fusion on 3/17. Neurosurgery is following,  recommended continue supportive care. Adequate pain control, aspiration precautions, mobilize as tolerated. PT and OT recommendation : SNF  Neurosurgery signed off, recommended outpatient follow-up.  Hyponatremia: > Improving This could be secondary to neurosurgery. Continue salt tablets. Recheck labs in the morning. 128> 132 > 129  Right shoulder pain secondary to subacromial bursitis  -MRI shoulder ; severe glenohumeral osteoarthritis, massive volume of subacromial/subdeltoid fluid consistent with bursitisand a complete tear of the long head of biceps from the superior labrum. Adequate pain control, continue supportive care.  Ice as needed. Consulted orthopedics.  Recommended no surgical intervention. Outpatient Ortho follow-up.  Anemia of chronic illness and chronic disease Hemoglobin down trended to 7.4 Hemoglobin PTA 11.4. Transfuse 1 PRBC,   posttransfusion hemoglobin 9.4 No signs of bleeding.  Hx of dementia. Continue home Namenda Delirium precautions.  Essential hypertension: Continue Norvasc, atenolol, chlorthalidone, Catapres and lisinopril.  Hyperlipidemia : continue Lipitor  Hypothyroidism. Continue home Synthroid  DM type 2 poorly controlled with hyperglycemia Continue  SSI and Levimir  Acute urinary retention : Patient continued to have urinary  retention requiring in and out cath. May consider indwelling Foley catheter if she continues to retain urine.  Discharge Instructions  Discharge Instructions    Call MD for:  difficulty breathing, headache or visual disturbances   Complete by: As directed    Call MD for:  persistant dizziness or light-headedness   Complete by: As directed    Call MD for:  persistant nausea and vomiting   Complete by: As directed    Diet - low sodium heart healthy   Complete by: As directed    Discharge instructions   Complete by: As directed    Advised to follow-up with primary care physician in 1 week.  Advised to follow-up with neurosurgery in 3 to 4 weeks.  Advised to take sodium chloride tablets 2 times daily for next few days. Advised to do voiding trial and remove the Foley catheter if he urinates.   Discharge wound care:   Complete by: As directed    Continue wound care at nursing home.   Incentive spirometry RT   Complete by: As directed    Increase activity slowly   Complete by: As directed      Allergies as of 06/18/2020      Reactions   Aricept [donepezil]    Other reaction(s): localised rash   Other Other (See Comments)      Medication List    TAKE these medications   amLODipine 10 MG tablet Commonly known as: NORVASC Take 10 mg by mouth daily.   aspirin EC 81 MG tablet Take 81 mg by mouth daily.   atenolol-chlorthalidone 50-25 MG tablet Commonly known as: TENORETIC Take 1 tablet by mouth daily.   atorvastatin 40 MG tablet Commonly known as: LIPITOR Take 40 mg by mouth daily.   augmented betamethasone dipropionate 0.05 % ointment Commonly known as: DIPROLENE-AF Apply 1 application topically daily as needed (rash).   cloNIDine 0.1 MG tablet Commonly known as: CATAPRES Take 0.1 mg by mouth 2 (two) times daily.   levothyroxine 25 MCG tablet Commonly known as: SYNTHROID Take 25 mcg by  mouth daily.   lisinopril 40 MG tablet Commonly known as: ZESTRIL Take 80 mg by mouth daily.   memantine 10 MG tablet Commonly known as: NAMENDA Take 10 mg by mouth every morning.   methocarbamol 500 MG tablet Commonly known as: ROBAXIN Take 1 tablet (500 mg total) by mouth every 6 (six) hours as needed for muscle spasms.   sodium chloride 1 g tablet Take 1 tablet (1 g total) by mouth 2 (two) times daily.            Discharge Care Instructions  (From admission, onward)         Start     Ordered   06/18/20 0000  Discharge wound care:       Comments: Continue wound care at nursing home.   06/18/20 1044          Follow-up Information    Dawley, Troy C, DO Follow up in 1 month(s).   Why: call for appointment Contact information: [address] [place] [address] [phone]        Georgianne Fick, MD Follow up in 1 week(s).   Specialty: Internal Medicine Contact information: [address] [place] [address] [place] [address] [phone]              Allergies  Allergen Reactions  . Aricept [Donepezil]     Other reaction(s): localised rash  . Other Other (See  Comments)    Consultations:  Neurosurgery   Procedures/Studies: DG Cervical Spine 2-3 Views  Result Date: 06/10/2020 CLINICAL DATA:  Posterior cervical decompression and fusion. EXAM: CERVICAL SPINE - 2-3 VIEW; DG C-ARM 1-60 MIN COMPARISON:  Preoperative imaging. FINDINGS: Three fluoroscopic collateral spot views of the cervical spine obtained in the operating room. Posterior screws tentatively numbered C2 through C5, though technically limited assessment due to positioning. Total fluoroscopy time 7 seconds. Total dose 0.36 mGy. IMPRESSION: Intraoperative fluoroscopic spot views during cervical spine surgery. Electronically Signed   By: Narda Rutherford M.D.   On: 06/10/2020 20:56   DG Abd 1 View  Result Date: 06/11/2020 CLINICAL DATA:  OG tube placement. EXAM:  ABDOMEN - 1 VIEW COMPARISON:  None. FINDINGS: Tip and side port of the enteric tube below the diaphragm in the stomach. There is likely an adjacent temperature probe in the stomach. Mild gaseous distension of bowel loops suggestive of ileus. Lung bases are clear. IMPRESSION: Tip and side port of the enteric tube below the diaphragm in the stomach. Electronically Signed   By: Narda Rutherford M.D.   On: 06/11/2020 16:58   CT CERVICAL SPINE WO CONTRAST  Result Date: 06/10/2020 CLINICAL DATA:  Spinal stenosis. Concern for hemorrhage on same day MRI of the cervical spine. EXAM: CT CERVICAL SPINE WITHOUT CONTRAST TECHNIQUE: Multidetector CT imaging of the cervical spine was performed without intravenous contrast. Multiplanar CT image reconstructions were also generated. COMPARISON:  Same day MRI of the cervical spine. FINDINGS: Alignment: Similar reversal of the normal cervical lordosis. Slight anterolisthesis of C3 on C4 and retrolisthesis of C4 on C5, favor degenerative given severe degenerative changes at these levels. Dextrocurvature. Skull base and vertebrae: No evidence of acute fracture. Vertebral body heights are maintained. Soft tissues and spinal canal: Ventral epidural abnormality seen on same day MRI is hyperdense on CT (for example see series 11, image 43) and does not correlate with bone, compatible with hemorrhage. Additional hyperdensity in the canal more superiorly at the foramen magnum/craniocervical junction, suggestive of hemorrhage. Disc levels: Please see same day MRI for characterization of multilevel degenerative change, including multilevel severe canal stenosis. Upper chest: Limited visualization lung apices without evidence of consolidation. Other: Calcific atherosclerosis of the carotid arteries. IMPRESSION: 1. Ventral epidural abnormality seen on same day MRI is hyperdense on CT and does not correlate with bone, compatible with acute/recent hemorrhage. Additional canal hemorrhage is noted  more superiorly at the foramen magnum/craniocervical junction. 2. Please see same day MRI for characterization of multilevel severe canal stenosis and cord edema. 3. No evidence of acute fracture. Electronically Signed   By: Feliberto Harts MD   On: 06/10/2020 16:05   MR BRAIN WO CONTRAST  Result Date: 06/10/2020 CLINICAL DATA:  Increasing weakness in the right arm and leg. EXAM: MRI HEAD WITHOUT CONTRAST TECHNIQUE: Multiplanar, multiecho pulse sequences of the brain and surrounding structures were obtained without intravenous contrast. COMPARISON:  01/21/2014 FINDINGS: Brain: No convincing infarction, hemorrhage, hydrocephalus, extra-axial collection or mass lesion. Minimal diffusion hyperintensity in the region of the lower left thalamus on coronal acquisition is not confirmed on the axial diffusion or other sequences. Cerebral volume which has progressed from 2015. Chronic small vessel ischemic change in the deep cerebral white matter that is mild for age, also mildly progressed. Clustered CSF intensity cystic spaces along the body and splenium of the corpus callosum on the left, stable and attributed to dilated perivascular spaces. Vascular: The left cervical ICA is difficult to visualize on T2 weighted  imaging but appears stable and symmetric on T1 weighted imaging when compared with prior. Skull and upper cervical spine: Advanced cervical spine degeneration with probable cord impingement at C2-3, C3-4, and C4-5. Sinuses/Orbits: No emergent finding. Bilateral cataract resection. Partial left mastoid opacification with negative nasopharynx. IMPRESSION: 1. No acute intracranial finding. 2. Brain involution that has progressed from 2015. 3. Cervical spine degeneration with multilevel upper cord impingement. 4. Partial left mastoid opacification with negative nasopharynx. Electronically Signed   By: Marnee Spring M.D.   On: 06/10/2020 07:33   MR CERVICAL SPINE WO CONTRAST  Result Date:  06/10/2020 CLINICAL DATA:  Increasing right arm and right leg weakness. Myelopathy. EXAM: MRI CERVICAL SPINE WITHOUT CONTRAST TECHNIQUE: Multiplanar, multisequence MR imaging of the cervical spine was performed. No intravenous contrast was administered. COMPARISON:  None. FINDINGS: Alignment: There is marked reversal of the normal cervical lordosis with the apex at C4. Vertebrae: No fracture or worrisome lesion. There is some degenerative endplate signal change from C2-3 to C5-6, worst at C5-6. Cord: There is edema in the cord from the foramen magnum to approximately mid C6. Posterior Fossa, vertebral arteries, paraspinal tissues: Empty sella is noted. The patient also has a 1 cm T2 hyperintense lesion left lobe of the thyroid. Not clinically significant; no follow-up imaging recommended (ref: J Am Coll Radiol. 2015 Feb;12(2): 143-50). Disc levels: C2-3: Disc bulge, ligamentum flavum thickening and right worse than left facet degenerative disease. There is severe central canal stenosis. T2 hypointense material is seen on both sides of the cord, greater on the left. This is contiguous with the ventral epidural structure described below. C3-4: Marked loss of disc space height with a bulge and endplate spur. There is facet arthropathy. Partially visualized at this level is a ventral epidural collection or structure measuring approximately 2.5 cm craniocaudal by 0.5 cm AP by up to 2 cm transverse. This area is T1 hypointense and mixed signal intensity on T2 weighted imaging. Severe flattening of the cord is present. Moderate to moderately severe foraminal narrowing is worse on the left. C4-5: Loss of disc space height with endplate spur. Cyst severe central canal stenosis and moderately severe bilateral foraminal narrowing. C5-6: Loss of disc space height with endplate spur and a bulge. Severe central canal stenosis and moderately severe to severe foraminal narrowing, worse on the left. C6-7: Shallow disc bulge and  ligamentum flavum thickening. The central canal and foramina appear open. C7-T1: Negative. IMPRESSION: Ventral epidural collection or structure is likely due to hematoma but could represent ossification of the posterior longitudinal ligament. Hematoma is favored signal intensity is similar to that seen about the cord at the C2 level. CT of the cervical spine could be used to assess for ossification of the posterior longitudinal ligament. Severe central canal stenosis at C2-3, C3-4, C4-5 and C5-6. There is edema within the cord from approximately the foramen magnum through the C6 level. The extent of edema is worrisome for acute abnormality. Critical Value/emergent results were called by telephone at the time of interpretation on 06/10/2020 at 2:46 pm to provider TROY DAWLEY , who verbally acknowledged these results. Electronically Signed   By: Drusilla Kanner M.D.   On: 06/10/2020 15:02   DG Pelvis Portable  Result Date: 06/10/2020 CLINICAL DATA:  Initial evaluation for acute pain status post fall. EXAM: PORTABLE PELVIS 1-2 VIEWS COMPARISON:  None. FINDINGS: Right hip in internal rotation, somewhat limiting assessment. There is suspected cortical irregularity with possible impaction at the subcapital right femoral neck, which could reflect  an acute fracture. Femoral head remains in normal alignment within the acetabulum. Femoral head height maintained. No acute abnormality about the left hip. Bony pelvis intact. SI joints approximated. No pubic diastasis. Advanced degenerative spondylosis noted within the lower lumbar spine. Osteopenia. No visible soft tissue injury. IMPRESSION: 1. Question cortical irregularity with possible impaction at the subcapital right femoral neck, suspicious for possible fracture. Correlation with physical exam recommended. Additionally, further assessment with dedicated radiographs of the right hip could be performed as clinically warranted. 2. No other acute osseous abnormality about  the pelvis. Electronically Signed   By: Rise MuBenjamin  McClintock M.D.   On: 06/10/2020 03:59   MR Shoulder Right W Wo Contrast  Result Date: 06/10/2020 CLINICAL DATA:  Severe right shoulder pain. EXAM: MRI OF THE RIGHT SHOULDER WITHOUT AND WITH CONTRAST TECHNIQUE: Multiplanar, multisequence MR imaging of the right shoulder was performed before and after the administration of intravenous contrast. CONTRAST:  5 mL GADAVIST IV SOLN COMPARISON:  Plain films right shoulder 06/10/2020. FINDINGS: Rotator cuff:  There is rotator cuff tendinopathy without tear. Muscles: Mild-to-moderate fatty atrophy of musculature of the shoulder girdle is identified. Biceps long head:  Completely torn from the superior labrum. Acromioclavicular Joint: Moderate osteoarthritis. Type 2 acromion. There is a massive volume of subacromial/subdeltoid fluid. Glenohumeral Joint: Severe degenerative change is present with marked flattening and remodeling of both the glenoid and the humeral head. Osteophyte off the humeral head is noted. Labrum:  Diffusely torn. Bones:  No fracture or focal lesion. Other: None. IMPRESSION: No acute abnormality. Severe glenohumeral osteoarthritis. Massive volume of subacromial/subdeltoid fluid consistent with bursitis. Complete tear of the long head of biceps from the superior labrum. Rotator cuff tendinopathy without tear. Mild atrophy of musculature about the shoulder is likely due to disuse. Moderate acromioclavicular osteoarthritis. Electronically Signed   By: Drusilla Kannerhomas  Dalessio M.D.   On: 06/10/2020 15:07   DG Chest Port 1 View  Result Date: 06/10/2020 CLINICAL DATA:  Initial evaluation for acute trauma, fall. EXAM: PORTABLE CHEST 1 VIEW COMPARISON:  None available. FINDINGS: Transverse heart size within normal limits. Mediastinal silhouette normal. Aortic atherosclerosis. Lungs normally inflated. No focal infiltrates. No pulmonary edema or pleural effusion. Minimal left basilar subsegmental atelectasis and/or  scarring. No visible pneumothorax. No acute osseous finding. Prominent osteoarthritic changes noted about the right shoulder. Underlying osteopenia. IMPRESSION: 1. Minimal left basilar subsegmental atelectasis and/or scarring. 2. No other active cardiopulmonary disease. 3.  Aortic Atherosclerosis (ICD10-I70.0). Electronically Signed   By: Rise MuBenjamin  McClintock M.D.   On: 06/10/2020 04:40   DG Shoulder Right Port  Result Date: 06/10/2020 CLINICAL DATA:  Initial evaluation for acute pain status post fall. EXAM: PORTABLE RIGHT SHOULDER COMPARISON:  None available. FINDINGS: No visible acute fracture or dislocation. Advanced osteoarthritic changes seen about the right glenohumeral articulation, with more mild to moderate changes about the right acromioclavicular joint. No visible soft tissue abnormality. Underlying osteopenia. Visualized right hemithorax clear. IMPRESSION: 1. No acute osseous abnormality about the right shoulder. 2. Advanced osteoarthritic changes about the right glenohumeral articulation. 3. Osteopenia. Electronically Signed   By: Rise MuBenjamin  McClintock M.D.   On: 06/10/2020 03:51   DG Knee Right Port  Result Date: 06/10/2020 CLINICAL DATA:  Initial evaluation for acute trauma, fall. EXAM: PORTABLE RIGHT KNEE - 1-2 VIEW COMPARISON:  None. FINDINGS: No acute fracture dislocation. Moderate osteoarthritic changes present about the knee. No joint effusion. No visible soft tissue injury. Osteopenia noted. Prominent vascular calcifications noted at the posterior right thigh/knee. IMPRESSION: 1. No acute osseous  abnormality about the right knee. 2. Moderate degenerative osteoarthrosis. Electronically Signed   By: Rise Mu M.D.   On: 06/10/2020 04:42   DG C-Arm 1-60 Min  Result Date: 06/10/2020 CLINICAL DATA:  Posterior cervical decompression and fusion. EXAM: CERVICAL SPINE - 2-3 VIEW; DG C-ARM 1-60 MIN COMPARISON:  Preoperative imaging. FINDINGS: Three fluoroscopic collateral spot views  of the cervical spine obtained in the operating room. Posterior screws tentatively numbered C2 through C5, though technically limited assessment due to positioning. Total fluoroscopy time 7 seconds. Total dose 0.36 mGy. IMPRESSION: Intraoperative fluoroscopic spot views during cervical spine surgery. Electronically Signed   By: Narda Rutherford M.D.   On: 06/10/2020 20:56   DG Hip Unilat W or Wo Pelvis 2-3 Views Right  Result Date: 06/10/2020 CLINICAL DATA:  Initial evaluation for acute trauma, fall. EXAM: DG HIP (WITH OR WITHOUT PELVIS) 2-3V RIGHT COMPARISON:  Prior radiograph of the pelvis from earlier the same day. FINDINGS: Additional views of the right hip demonstrate no acute fracture or dislocation. Femoral head in normal alignment within the acetabulum. Femoral head height maintained. Visualized bony pelvis intact. Mild osteoarthritic changes noted about the hips. No visible soft tissue injury. Advanced degenerative spondylosis again noted within the lower lumbar spine. Vascular calcifications overlying the pelvis and thigh noted. IMPRESSION: 1. No acute fracture or dislocation. 2. Previously questioned cortical irregularity is not seen on these additional views of the right hip. This was likely positional on prior exam. Electronically Signed   By: Rise Mu M.D.   On: 06/10/2020 04:45    Cervical spinal decompression and fusion   Subjective: Patient was seen and examined at bedside.  Overnight events noted.  Patient reports feeling much better.   Patient reports pain is controlled and she wants to be discharged for rehab.  Discharge Exam: Vitals:   06/18/20 0359 06/18/20 0746  BP: (!) 165/60 (!) 155/57  Pulse: 71 67  Resp: 20 20  Temp: 99.2 F (37.3 C) 98.8 F (37.1 C)  SpO2: 99% 98%   Vitals:   06/17/20 2046 06/18/20 0014 06/18/20 0359 06/18/20 0746  BP: (!) 134/58 (!) 145/55 (!) 165/60 (!) 155/57  Pulse: 69 70 71 67  Resp:  18 20 20   Temp: 98.8 F (37.1 C) 99.1 F  (37.3 C) 99.2 F (37.3 C) 98.8 F (37.1 C)  TempSrc: Oral Oral Oral Oral  SpO2: 98% 99% 99% 98%  Weight:      Height:        General: Pt is alert, awake, not in acute distress Cardiovascular: RRR, S1/S2 +, no rubs, no gallops Respiratory: CTA bilaterally, no wheezing, no rhonchi Abdominal: Soft, NT, ND, bowel sounds + Extremities: no edema, no cyanosis    The results of significant diagnostics from this hospitalization (including imaging, microbiology, ancillary and laboratory) are listed below for reference.     Microbiology: Recent Results (from the past 240 hour(s))  Resp Panel by RT-PCR (Flu A&B, Covid) Nasopharyngeal Swab     Status: None   Collection Time: 06/10/20  5:52 AM   Specimen: Nasopharyngeal Swab; Nasopharyngeal(NP) swabs in vial transport medium  Result Value Ref Range Status   SARS Coronavirus 2 by RT PCR NEGATIVE NEGATIVE Final    Comment: (NOTE) SARS-CoV-2 target nucleic acids are NOT DETECTED.  The SARS-CoV-2 RNA is generally detectable in upper respiratory specimens during the acute phase of infection. The lowest concentration of SARS-CoV-2 viral copies this assay can detect is 138 copies/mL. A negative result does not preclude SARS-Cov-2  infection and should not be used as the sole basis for treatment or other patient management decisions. A negative result may occur with  improper specimen collection/handling, submission of specimen other than nasopharyngeal swab, presence of viral mutation(s) within the areas targeted by this assay, and inadequate number of viral copies(<138 copies/mL). A negative result must be combined with clinical observations, patient history, and epidemiological information. The expected result is Negative.  Fact Sheet for Patients:  BloggerCourse.com  Fact Sheet for Healthcare Providers:  SeriousBroker.it  This test is no t yet approved or cleared by the [place] FDA  and  has been authorized for detection and/or diagnosis of SARS-CoV-2 by FDA under an Emergency Use Authorization (EUA). This EUA will remain  in effect (meaning this test can be used) for the duration of the COVID-19 declaration under Section 564(b)(1) of the Act, 21 U.S.C.section 360bbb-3(b)(1), unless the authorization is terminated  or revoked sooner.       Influenza A by PCR NEGATIVE NEGATIVE Final   Influenza B by PCR NEGATIVE NEGATIVE Final    Comment: (NOTE) The Xpert Xpress SARS-CoV-2/FLU/RSV plus assay is intended as an aid in the diagnosis of influenza from Nasopharyngeal swab specimens and should not be used as a sole basis for treatment. Nasal washings and aspirates are unacceptable for Xpert Xpress SARS-CoV-2/FLU/RSV testing.  Fact Sheet for Patients: BloggerCourse.com  Fact Sheet for Healthcare Providers: SeriousBroker.it  This test is not yet approved or cleared by the [place] FDA and has been authorized for detection and/or diagnosis of SARS-CoV-2 by FDA under an Emergency Use Authorization (EUA). This EUA will remain in effect (meaning this test can be used) for the duration of the COVID-19 declaration under Section 564(b)(1) of the Act, 21 U.S.C. section 360bbb-3(b)(1), unless the authorization is terminated or revoked.  Performed at [facility], [address] [place], [place] [address]   SARS CORONAVIRUS 2 (TAT 6-24 HRS) Nasopharyngeal Nasopharyngeal Swab     Status: None   Collection Time: 06/16/20  4:12 PM   Specimen: Nasopharyngeal Swab  Result Value Ref Range Status   SARS Coronavirus 2 NEGATIVE NEGATIVE Final    Comment: (NOTE) SARS-CoV-2 target nucleic acids are NOT DETECTED.  The SARS-CoV-2 RNA is generally detectable in upper and lower respiratory specimens during the acute phase of infection. Negative results do not preclude SARS-CoV-2 infection, do not rule out co-infections  with other pathogens, and should not be used as the sole basis for treatment or other patient management decisions. Negative results must be combined with clinical observations, patient history, and epidemiological information. The expected result is Negative.  Fact Sheet for Patients: HairSlick.no  Fact Sheet for Healthcare Providers: quierodirigir.com  This test is not yet approved or cleared by the [place] FDA and  has been authorized for detection and/or diagnosis of SARS-CoV-2 by FDA under an Emergency Use Authorization (EUA). This EUA will remain  in effect (meaning this test can be used) for the duration of the COVID-19 declaration under Se ction 564(b)(1) of the Act, 21 U.S.C. section 360bbb-3(b)(1), unless the authorization is terminated or revoked sooner.  Performed at [facility], [address], [place], [place] [address]      Labs: BNP (last 3 results) No results for input(s): BNP in the last 8760 hours. Basic Metabolic Panel: Recent Labs  Lab 06/12/20 0418 06/12/20 1942 06/13/20 0359 06/13/20 1307 06/14/20 0345 06/15/20 0219 06/17/20 0424 06/18/20 0406  NA 130*  --  129* 128* 129* 128*  132* 129*  K 3.0*  --  2.8* 3.9 4.1 3.6 3.6 3.7  CL 97*  --  99 99 99 96* 101 97*  CO2 22  --  GLUCOSE 259*  --  83 142* 128* 196* 134* 147*  BUN 17  --  8 7* <5* 6* 9 8  CREATININE 0.82  --  0.50 0.45 0.46 0.53 0.48 0.47  CALCIUM 8.2*  --  8.2* 8.1* 8.3* 8.3* 8.6* 8.7*  MG 1.0* 2.3 2.1  --  1.5* 1.8  --   --   PHOS 2.3*  --  2.4*  --  2.2* 2.7  --   --    Liver Function Tests: No results for input(s): AST, ALT, ALKPHOS, BILITOT, PROT, ALBUMIN in the last 168 hours. No results for input(s): LIPASE, AMYLASE in the last 168 hours. No results for input(s): AMMONIA in the last 168 hours. CBC: Recent Labs  Lab 06/12/20 0418 06/14/20 0345 06/15/20 0821 06/17/20 0424 06/18/20 0406   WBC 11.8* 7.4  --   --  9.5  HGB 8.4* 7.6* 7.7* 7.4* 9.5*  HCT 25.5* 23.1* 24.0* 22.6* 28.8*  MCV 81.7 83.4  --   --  82.3  PLT 292 224  --   --  272   Cardiac Enzymes: No results for input(s): CKTOTAL, CKMB, CKMBINDEX, TROPONINI in the last 168 hours. BNP: Invalid input(s): POCBNP CBG: Recent Labs  Lab 06/17/20 0803 06/17/20 1115 06/17/20 1630 06/17/20 2120 06/18/20 0630  GLUCAP 119* 147* 173* 205* 134*   D-Dimer No results for input(s): DDIMER in the last 72 hours. Hgb A1c No results for input(s): HGBA1C in the last 72 hours. Lipid Profile No results for input(s): CHOL, HDL, LDLCALC, TRIG, CHOLHDL, LDLDIRECT in the last 72 hours. Thyroid function studies No results for input(s): TSH, T4TOTAL, T3FREE, THYROIDAB in the last 72 hours.  Invalid input(s): FREET3 Anemia work up No results for input(s): VITAMINB12, FOLATE, FERRITIN, TIBC, IRON, RETICCTPCT in the last 72 hours. Urinalysis    Component Value Date/Time   COLORURINE YELLOW 06/10/2020 1258   APPEARANCEUR HAZY (A) 06/10/2020 1258   LABSPEC 1.011 06/10/2020 1258   PHURINE 5.0 06/10/2020 1258   GLUCOSEU NEGATIVE 06/10/2020 1258   HGBUR NEGATIVE 06/10/2020 1258   BILIRUBINUR NEGATIVE 06/10/2020 1258   KETONESUR 5 (A) 06/10/2020 1258   PROTEINUR NEGATIVE 06/10/2020 1258   NITRITE NEGATIVE 06/10/2020 1258   LEUKOCYTESUR NEGATIVE 06/10/2020 1258   Sepsis Labs Invalid input(s): PROCALCITONIN,  WBC,  LACTICIDVEN Microbiology Recent Results (from the past 240 hour(s))  Resp Panel by RT-PCR (Flu A&B, Covid) Nasopharyngeal Swab     Status: None   Collection Time: 06/10/20  5:52 AM   Specimen: Nasopharyngeal Swab; Nasopharyngeal(NP) swabs in vial transport medium  Result Value Ref Range Status   SARS Coronavirus 2 by RT PCR NEGATIVE NEGATIVE Final    Comment: (NOTE) SARS-CoV-2 target nucleic acids are NOT DETECTED.  The SARS-CoV-2 RNA is generally detectable in upper respiratory specimens during the acute phase  of infection. The lowest concentration of SARS-CoV-2 viral copies this assay can detect is 138 copies/mL. A negative result does not preclude SARS-Cov-2 infection and should not be used as the sole basis for treatment or other patient management decisions. A negative result may occur with  improper specimen collection/handling, submission of specimen other than nasopharyngeal swab, presence of viral mutation(s) within the areas targeted by this assay, and inadequate number of viral copies(<138 copies/mL). A negative result must  be combined with clinical observations, patient history, and epidemiological information. The expected result is Negative.  Fact Sheet for Patients:  BloggerCourse.com  Fact Sheet for Healthcare Providers:  SeriousBroker.it  This test is no t yet approved or cleared by the [place] FDA and  has been authorized for detection and/or diagnosis of SARS-CoV-2 by FDA under an Emergency Use Authorization (EUA). This EUA will remain  in effect (meaning this test can be used) for the duration of the COVID-19 declaration under Section 564(b)(1) of the Act, 21 U.S.C.section 360bbb-3(b)(1), unless the authorization is terminated  or revoked sooner.       Influenza A by PCR NEGATIVE NEGATIVE Final   Influenza B by PCR NEGATIVE NEGATIVE Final    Comment: (NOTE) The Xpert Xpress SARS-CoV-2/FLU/RSV plus assay is intended as an aid in the diagnosis of influenza from Nasopharyngeal swab specimens and should not be used as a sole basis for treatment. Nasal washings and aspirates are unacceptable for Xpert Xpress SARS-CoV-2/FLU/RSV testing.  Fact Sheet for Patients: BloggerCourse.com  Fact Sheet for Healthcare Providers: SeriousBroker.it  This test is not yet approved or cleared by the [place] FDA and has been authorized for detection and/or diagnosis of  SARS-CoV-2 by FDA under an Emergency Use Authorization (EUA). This EUA will remain in effect (meaning this test can be used) for the duration of the COVID-19 declaration under Section 564(b)(1) of the Act, 21 U.S.C. section 360bbb-3(b)(1), unless the authorization is terminated or revoked.  Performed at [facility], [address] [place], [place] [address]   SARS CORONAVIRUS 2 (TAT 6-24 HRS) Nasopharyngeal Nasopharyngeal Swab     Status: None   Collection Time: 06/16/20  4:12 PM   Specimen: Nasopharyngeal Swab  Result Value Ref Range Status   SARS Coronavirus 2 NEGATIVE NEGATIVE Final    Comment: (NOTE) SARS-CoV-2 target nucleic acids are NOT DETECTED.  The SARS-CoV-2 RNA is generally detectable in upper and lower respiratory specimens during the acute phase of infection. Negative results do not preclude SARS-CoV-2 infection, do not rule out co-infections with other pathogens, and should not be used as the sole basis for treatment or other patient management decisions. Negative results must be combined with clinical observations, patient history, and epidemiological information. The expected result is Negative.  Fact Sheet for Patients: HairSlick.no  Fact Sheet for Healthcare Providers: quierodirigir.com  This test is not yet approved or cleared by the [place] FDA and  has been authorized for detection and/or diagnosis of SARS-CoV-2 by FDA under an Emergency Use Authorization (EUA). This EUA will remain  in effect (meaning this test can be used) for the duration of the COVID-19 declaration under Se ction 564(b)(1) of the Act, 21 U.S.C. section 360bbb-3(b)(1), unless the authorization is terminated or revoked sooner.  Performed at [facility], [address] [place], [place] [address]      Time coordinating discharge: Over 30 minutes  SIGNED:   Cipriano Bunker, MD  [facility] 06/18/2020, 10:45 AM Pager   If 7PM-7AM, please contact night-coverage [email or web address]

## 2020-06-18 NOTE — TOC Transition Note (Signed)
Transition of Care [facility]) - CM/SW Discharge Note   Patient Details  Name: DEMARIS BOUSQUET MRN: [id number] Date of Birth: 1929/07/18  Transition of Care [facility]) CM/SW Contact:  Baldemar Lenis, LCSW Phone Number: 06/18/2020, 11:20 AM   Clinical Narrative:   Nurse to call report to [phone], Room 120.    Final next level of care: Skilled Nursing Facility Barriers to Discharge: Barriers Resolved   Patient Goals and CMS Choice Patient states their goals for this hospitalization and ongoing recovery are:: Go to [employer].gov Compare Post Acute Care list provided to:: Patient Choice offered to / list presented to : Patient  Discharge Placement              Patient chooses bed at: [facility] Patient to be transferred to facility by: [facility] Name of family member notified: Self, Cherrie Patient and family notified of of transfer: 06/18/20  Discharge Plan and Services                                     Social Determinants of Health (SDOH) Interventions     Readmission Risk Interventions No flowsheet data found.

## 2020-06-18 NOTE — Discharge Instructions (Signed)
Advised to follow-up with primary care physician in 1 week.   Advised to follow-up with neurosurgery in 3 to 4 weeks.   Advised to take sodium chloride tablets 2 times daily for next few days. Advised to do voiding trial and remove the Foley catheter if he urinates.

## 2020-06-18 NOTE — Progress Notes (Signed)
Patient ready for discharge to SNF; report called; discharge instructions reviewed with patient;[facility] transport given report; patient discharged out via stretcher; niece notified of the time she left the hospital.

## 2020-06-18 NOTE — Progress Notes (Signed)
Report called to [facility]. 

## 2020-06-24 DIAGNOSIS — F0390 Unspecified dementia without behavioral disturbance: Secondary | ICD-10-CM | POA: Diagnosis not present

## 2020-06-24 DIAGNOSIS — I621 Nontraumatic extradural hemorrhage: Secondary | ICD-10-CM | POA: Diagnosis not present

## 2020-06-24 DIAGNOSIS — R339 Retention of urine, unspecified: Secondary | ICD-10-CM | POA: Diagnosis not present

## 2020-06-24 DIAGNOSIS — E43 Unspecified severe protein-calorie malnutrition: Secondary | ICD-10-CM | POA: Diagnosis not present

## 2020-06-24 DIAGNOSIS — I10 Essential (primary) hypertension: Secondary | ICD-10-CM | POA: Diagnosis not present

## 2020-06-25 DIAGNOSIS — E876 Hypokalemia: Secondary | ICD-10-CM | POA: Diagnosis not present

## 2020-06-25 DIAGNOSIS — R5383 Other fatigue: Secondary | ICD-10-CM | POA: Diagnosis not present

## 2020-06-25 DIAGNOSIS — E860 Dehydration: Secondary | ICD-10-CM | POA: Diagnosis not present

## 2020-06-25 DIAGNOSIS — E871 Hypo-osmolality and hyponatremia: Secondary | ICD-10-CM | POA: Diagnosis not present

## 2020-06-25 DIAGNOSIS — M6281 Muscle weakness (generalized): Secondary | ICD-10-CM | POA: Diagnosis not present

## 2020-06-25 DIAGNOSIS — R627 Adult failure to thrive: Secondary | ICD-10-CM | POA: Diagnosis not present

## 2020-06-27 DIAGNOSIS — R252 Cramp and spasm: Secondary | ICD-10-CM | POA: Diagnosis not present

## 2020-06-27 DIAGNOSIS — R519 Headache, unspecified: Secondary | ICD-10-CM | POA: Diagnosis not present

## 2020-06-27 DIAGNOSIS — J189 Pneumonia, unspecified organism: Secondary | ICD-10-CM | POA: Diagnosis not present

## 2020-06-27 DIAGNOSIS — E871 Hypo-osmolality and hyponatremia: Secondary | ICD-10-CM | POA: Diagnosis not present

## 2020-06-28 DIAGNOSIS — E876 Hypokalemia: Secondary | ICD-10-CM | POA: Diagnosis not present

## 2020-06-28 DIAGNOSIS — R5383 Other fatigue: Secondary | ICD-10-CM | POA: Diagnosis not present

## 2020-06-28 DIAGNOSIS — E871 Hypo-osmolality and hyponatremia: Secondary | ICD-10-CM | POA: Diagnosis not present

## 2020-06-28 DIAGNOSIS — M6281 Muscle weakness (generalized): Secondary | ICD-10-CM | POA: Diagnosis not present

## 2020-06-28 DIAGNOSIS — J189 Pneumonia, unspecified organism: Secondary | ICD-10-CM | POA: Diagnosis not present

## 2020-06-28 DIAGNOSIS — R627 Adult failure to thrive: Secondary | ICD-10-CM | POA: Diagnosis not present

## 2020-07-01 DIAGNOSIS — J189 Pneumonia, unspecified organism: Secondary | ICD-10-CM | POA: Diagnosis not present

## 2020-07-01 DIAGNOSIS — R001 Bradycardia, unspecified: Secondary | ICD-10-CM | POA: Diagnosis not present

## 2020-07-01 DIAGNOSIS — E871 Hypo-osmolality and hyponatremia: Secondary | ICD-10-CM | POA: Diagnosis not present

## 2020-07-01 DIAGNOSIS — E876 Hypokalemia: Secondary | ICD-10-CM | POA: Diagnosis not present

## 2020-07-01 DIAGNOSIS — R5383 Other fatigue: Secondary | ICD-10-CM | POA: Diagnosis not present

## 2020-07-01 DIAGNOSIS — R627 Adult failure to thrive: Secondary | ICD-10-CM | POA: Diagnosis not present

## 2020-07-01 DIAGNOSIS — R5381 Other malaise: Secondary | ICD-10-CM | POA: Diagnosis not present

## 2020-07-01 DIAGNOSIS — M6281 Muscle weakness (generalized): Secondary | ICD-10-CM | POA: Diagnosis not present

## 2020-07-05 DIAGNOSIS — R8290 Unspecified abnormal findings in urine: Secondary | ICD-10-CM | POA: Diagnosis not present

## 2020-07-05 DIAGNOSIS — D649 Anemia, unspecified: Secondary | ICD-10-CM | POA: Diagnosis not present

## 2020-07-05 DIAGNOSIS — R5383 Other fatigue: Secondary | ICD-10-CM | POA: Diagnosis not present

## 2020-07-05 DIAGNOSIS — R627 Adult failure to thrive: Secondary | ICD-10-CM | POA: Diagnosis not present

## 2020-07-05 DIAGNOSIS — R339 Retention of urine, unspecified: Secondary | ICD-10-CM | POA: Diagnosis not present

## 2020-07-05 DIAGNOSIS — D72829 Elevated white blood cell count, unspecified: Secondary | ICD-10-CM | POA: Diagnosis not present

## 2020-07-05 DIAGNOSIS — E860 Dehydration: Secondary | ICD-10-CM | POA: Diagnosis not present

## 2020-07-05 DIAGNOSIS — R5381 Other malaise: Secondary | ICD-10-CM | POA: Diagnosis not present

## 2020-07-05 DIAGNOSIS — N390 Urinary tract infection, site not specified: Secondary | ICD-10-CM | POA: Diagnosis not present

## 2020-07-06 DIAGNOSIS — R339 Retention of urine, unspecified: Secondary | ICD-10-CM | POA: Diagnosis not present

## 2020-07-06 DIAGNOSIS — L89626 Pressure-induced deep tissue damage of left heel: Secondary | ICD-10-CM | POA: Diagnosis not present

## 2020-07-06 DIAGNOSIS — R627 Adult failure to thrive: Secondary | ICD-10-CM | POA: Diagnosis not present

## 2020-07-06 DIAGNOSIS — E860 Dehydration: Secondary | ICD-10-CM | POA: Diagnosis not present

## 2020-07-06 DIAGNOSIS — N390 Urinary tract infection, site not specified: Secondary | ICD-10-CM | POA: Diagnosis not present

## 2020-07-06 DIAGNOSIS — M6281 Muscle weakness (generalized): Secondary | ICD-10-CM | POA: Diagnosis not present

## 2020-07-06 DIAGNOSIS — D649 Anemia, unspecified: Secondary | ICD-10-CM | POA: Diagnosis not present

## 2020-07-06 DIAGNOSIS — R8290 Unspecified abnormal findings in urine: Secondary | ICD-10-CM | POA: Diagnosis not present

## 2020-07-06 DIAGNOSIS — E119 Type 2 diabetes mellitus without complications: Secondary | ICD-10-CM | POA: Diagnosis not present

## 2020-07-08 DIAGNOSIS — R339 Retention of urine, unspecified: Secondary | ICD-10-CM | POA: Diagnosis not present

## 2020-07-08 DIAGNOSIS — E871 Hypo-osmolality and hyponatremia: Secondary | ICD-10-CM | POA: Diagnosis not present

## 2020-07-08 DIAGNOSIS — R627 Adult failure to thrive: Secondary | ICD-10-CM | POA: Diagnosis not present

## 2020-07-08 DIAGNOSIS — R5383 Other fatigue: Secondary | ICD-10-CM | POA: Diagnosis not present

## 2020-07-08 DIAGNOSIS — N390 Urinary tract infection, site not specified: Secondary | ICD-10-CM | POA: Diagnosis not present

## 2020-07-08 DIAGNOSIS — E860 Dehydration: Secondary | ICD-10-CM | POA: Diagnosis not present

## 2020-07-08 DIAGNOSIS — M6281 Muscle weakness (generalized): Secondary | ICD-10-CM | POA: Diagnosis not present

## 2020-07-12 DIAGNOSIS — R5381 Other malaise: Secondary | ICD-10-CM | POA: Diagnosis not present

## 2020-07-12 DIAGNOSIS — M6281 Muscle weakness (generalized): Secondary | ICD-10-CM | POA: Diagnosis not present

## 2020-07-12 DIAGNOSIS — R627 Adult failure to thrive: Secondary | ICD-10-CM | POA: Diagnosis not present

## 2020-07-12 DIAGNOSIS — E871 Hypo-osmolality and hyponatremia: Secondary | ICD-10-CM | POA: Diagnosis not present

## 2020-07-12 DIAGNOSIS — D649 Anemia, unspecified: Secondary | ICD-10-CM | POA: Diagnosis not present

## 2020-07-12 DIAGNOSIS — R5383 Other fatigue: Secondary | ICD-10-CM | POA: Diagnosis not present

## 2020-07-12 DIAGNOSIS — R339 Retention of urine, unspecified: Secondary | ICD-10-CM | POA: Diagnosis not present

## 2020-07-12 DIAGNOSIS — N390 Urinary tract infection, site not specified: Secondary | ICD-10-CM | POA: Diagnosis not present

## 2020-07-12 DIAGNOSIS — E860 Dehydration: Secondary | ICD-10-CM | POA: Diagnosis not present

## 2020-07-13 ENCOUNTER — Inpatient Hospital Stay (HOSPITAL_COMMUNITY)
Admission: EM | Admit: 2020-07-13 | Discharge: 2020-08-06 | DRG: 643 | Disposition: A | Discharge status: Skilled Nursing Facility | Payer: Medicare PPO | Source: Skilled Nursing Facility | Attending: Internal Medicine | Admitting: Internal Medicine

## 2020-07-13 ENCOUNTER — Other Ambulatory Visit: Payer: Self-pay

## 2020-07-13 ENCOUNTER — Encounter (HOSPITAL_COMMUNITY): Payer: Self-pay

## 2020-07-13 ENCOUNTER — Inpatient Hospital Stay (HOSPITAL_COMMUNITY): Payer: Medicare PPO

## 2020-07-13 DIAGNOSIS — R55 Syncope and collapse: Secondary | ICD-10-CM | POA: Diagnosis not present

## 2020-07-13 DIAGNOSIS — E039 Hypothyroidism, unspecified: Secondary | ICD-10-CM | POA: Diagnosis present

## 2020-07-13 DIAGNOSIS — Y92129 Unspecified place in nursing home as the place of occurrence of the external cause: Secondary | ICD-10-CM

## 2020-07-13 DIAGNOSIS — R627 Adult failure to thrive: Secondary | ICD-10-CM | POA: Diagnosis present

## 2020-07-13 DIAGNOSIS — M7989 Other specified soft tissue disorders: Secondary | ICD-10-CM | POA: Diagnosis not present

## 2020-07-13 DIAGNOSIS — R404 Transient alteration of awareness: Secondary | ICD-10-CM | POA: Diagnosis not present

## 2020-07-13 DIAGNOSIS — Z6822 Body mass index (BMI) 22.0-22.9, adult: Secondary | ICD-10-CM

## 2020-07-13 DIAGNOSIS — R680 Hypothermia, not associated with low environmental temperature: Secondary | ICD-10-CM | POA: Diagnosis present

## 2020-07-13 DIAGNOSIS — R0989 Other specified symptoms and signs involving the circulatory and respiratory systems: Secondary | ICD-10-CM | POA: Diagnosis not present

## 2020-07-13 DIAGNOSIS — Z79899 Other long term (current) drug therapy: Secondary | ICD-10-CM | POA: Diagnosis not present

## 2020-07-13 DIAGNOSIS — I959 Hypotension, unspecified: Secondary | ICD-10-CM | POA: Diagnosis not present

## 2020-07-13 DIAGNOSIS — E440 Moderate protein-calorie malnutrition: Secondary | ICD-10-CM | POA: Diagnosis not present

## 2020-07-13 DIAGNOSIS — Z515 Encounter for palliative care: Secondary | ICD-10-CM

## 2020-07-13 DIAGNOSIS — Z794 Long term (current) use of insulin: Secondary | ICD-10-CM | POA: Diagnosis not present

## 2020-07-13 DIAGNOSIS — D649 Anemia, unspecified: Secondary | ICD-10-CM

## 2020-07-13 DIAGNOSIS — L89629 Pressure ulcer of left heel, unspecified stage: Secondary | ICD-10-CM | POA: Diagnosis present

## 2020-07-13 DIAGNOSIS — G9341 Metabolic encephalopathy: Secondary | ICD-10-CM | POA: Diagnosis not present

## 2020-07-13 DIAGNOSIS — Z981 Arthrodesis status: Secondary | ICD-10-CM

## 2020-07-13 DIAGNOSIS — L8990 Pressure ulcer of unspecified site, unspecified stage: Secondary | ICD-10-CM | POA: Insufficient documentation

## 2020-07-13 DIAGNOSIS — L89509 Pressure ulcer of unspecified ankle, unspecified stage: Secondary | ICD-10-CM | POA: Insufficient documentation

## 2020-07-13 DIAGNOSIS — Z20822 Contact with and (suspected) exposure to covid-19: Secondary | ICD-10-CM | POA: Diagnosis not present

## 2020-07-13 DIAGNOSIS — E222 Syndrome of inappropriate secretion of antidiuretic hormone: Principal | ICD-10-CM | POA: Diagnosis present

## 2020-07-13 DIAGNOSIS — R64 Cachexia: Secondary | ICD-10-CM | POA: Diagnosis not present

## 2020-07-13 DIAGNOSIS — Z66 Do not resuscitate: Secondary | ICD-10-CM | POA: Diagnosis not present

## 2020-07-13 DIAGNOSIS — R4182 Altered mental status, unspecified: Secondary | ICD-10-CM | POA: Diagnosis not present

## 2020-07-13 DIAGNOSIS — F0390 Unspecified dementia without behavioral disturbance: Secondary | ICD-10-CM | POA: Diagnosis present

## 2020-07-13 DIAGNOSIS — T502X5A Adverse effect of carbonic-anhydrase inhibitors, benzothiadiazides and other diuretics, initial encounter: Secondary | ICD-10-CM | POA: Diagnosis present

## 2020-07-13 DIAGNOSIS — E875 Hyperkalemia: Secondary | ICD-10-CM | POA: Diagnosis not present

## 2020-07-13 DIAGNOSIS — Z79890 Hormone replacement therapy (postmenopausal): Secondary | ICD-10-CM | POA: Diagnosis not present

## 2020-07-13 DIAGNOSIS — E860 Dehydration: Secondary | ICD-10-CM | POA: Diagnosis not present

## 2020-07-13 DIAGNOSIS — E1165 Type 2 diabetes mellitus with hyperglycemia: Secondary | ICD-10-CM | POA: Diagnosis present

## 2020-07-13 DIAGNOSIS — N390 Urinary tract infection, site not specified: Secondary | ICD-10-CM | POA: Diagnosis present

## 2020-07-13 DIAGNOSIS — M79601 Pain in right arm: Secondary | ICD-10-CM | POA: Diagnosis not present

## 2020-07-13 DIAGNOSIS — R9431 Abnormal electrocardiogram [ECG] [EKG]: Secondary | ICD-10-CM | POA: Diagnosis not present

## 2020-07-13 DIAGNOSIS — R600 Localized edema: Secondary | ICD-10-CM | POA: Diagnosis present

## 2020-07-13 DIAGNOSIS — Z87891 Personal history of nicotine dependence: Secondary | ICD-10-CM | POA: Diagnosis not present

## 2020-07-13 DIAGNOSIS — L89152 Pressure ulcer of sacral region, stage 2: Secondary | ICD-10-CM | POA: Diagnosis present

## 2020-07-13 DIAGNOSIS — R609 Edema, unspecified: Secondary | ICD-10-CM | POA: Diagnosis not present

## 2020-07-13 DIAGNOSIS — I10 Essential (primary) hypertension: Secondary | ICD-10-CM

## 2020-07-13 DIAGNOSIS — T68XXXA Hypothermia, initial encounter: Secondary | ICD-10-CM

## 2020-07-13 DIAGNOSIS — Z7982 Long term (current) use of aspirin: Secondary | ICD-10-CM

## 2020-07-13 DIAGNOSIS — E8809 Other disorders of plasma-protein metabolism, not elsewhere classified: Secondary | ICD-10-CM | POA: Diagnosis present

## 2020-07-13 DIAGNOSIS — Z4682 Encounter for fitting and adjustment of non-vascular catheter: Secondary | ICD-10-CM | POA: Diagnosis not present

## 2020-07-13 DIAGNOSIS — E876 Hypokalemia: Secondary | ICD-10-CM | POA: Diagnosis present

## 2020-07-13 DIAGNOSIS — I951 Orthostatic hypotension: Secondary | ICD-10-CM | POA: Diagnosis not present

## 2020-07-13 DIAGNOSIS — E119 Type 2 diabetes mellitus without complications: Secondary | ICD-10-CM

## 2020-07-13 DIAGNOSIS — E1151 Type 2 diabetes mellitus with diabetic peripheral angiopathy without gangrene: Secondary | ICD-10-CM | POA: Diagnosis present

## 2020-07-13 DIAGNOSIS — Y846 Urinary catheterization as the cause of abnormal reaction of the patient, or of later complication, without mention of misadventure at the time of the procedure: Secondary | ICD-10-CM | POA: Diagnosis present

## 2020-07-13 DIAGNOSIS — T83511A Infection and inflammatory reaction due to indwelling urethral catheter, initial encounter: Secondary | ICD-10-CM | POA: Diagnosis not present

## 2020-07-13 DIAGNOSIS — M1990 Unspecified osteoarthritis, unspecified site: Secondary | ICD-10-CM | POA: Diagnosis present

## 2020-07-13 DIAGNOSIS — R531 Weakness: Secondary | ICD-10-CM | POA: Diagnosis not present

## 2020-07-13 DIAGNOSIS — Z1621 Resistance to vancomycin: Secondary | ICD-10-CM | POA: Diagnosis not present

## 2020-07-13 DIAGNOSIS — Z888 Allergy status to other drugs, medicaments and biological substances status: Secondary | ICD-10-CM

## 2020-07-13 DIAGNOSIS — Z743 Need for continuous supervision: Secondary | ICD-10-CM | POA: Diagnosis not present

## 2020-07-13 DIAGNOSIS — E871 Hypo-osmolality and hyponatremia: Secondary | ICD-10-CM

## 2020-07-13 DIAGNOSIS — Z882 Allergy status to sulfonamides status: Secondary | ICD-10-CM

## 2020-07-13 DIAGNOSIS — D509 Iron deficiency anemia, unspecified: Secondary | ICD-10-CM | POA: Diagnosis present

## 2020-07-13 DIAGNOSIS — E785 Hyperlipidemia, unspecified: Secondary | ICD-10-CM | POA: Diagnosis present

## 2020-07-13 DIAGNOSIS — Z4659 Encounter for fitting and adjustment of other gastrointestinal appliance and device: Secondary | ICD-10-CM

## 2020-07-13 DIAGNOSIS — M62838 Other muscle spasm: Secondary | ICD-10-CM | POA: Diagnosis not present

## 2020-07-13 DIAGNOSIS — R0902 Hypoxemia: Secondary | ICD-10-CM | POA: Diagnosis not present

## 2020-07-13 DIAGNOSIS — J90 Pleural effusion, not elsewhere classified: Secondary | ICD-10-CM | POA: Diagnosis not present

## 2020-07-13 LAB — CBC
HCT: 20.4 % — ABNORMAL LOW (ref 36.0–46.0)
HCT: 22.6 % — ABNORMAL LOW (ref 36.0–46.0)
Hemoglobin: 6.5 g/dL — CL (ref 12.0–15.0)
Hemoglobin: 7.6 g/dL — ABNORMAL LOW (ref 12.0–15.0)
MCH: 26.3 pg (ref 26.0–34.0)
MCH: 26.2 pg (ref 26.0–34.0)
MCHC: 31.9 g/dL (ref 30.0–36.0)
MCHC: 33.6 g/dL (ref 30.0–36.0)
MCV: 82.6 fL (ref 80.0–100.0)
MCV: 77.9 fL — ABNORMAL LOW (ref 80.0–100.0)
Platelets: 200 10*3/uL (ref 150–400)
Platelets: 238 10*3/uL (ref 150–400)
RBC: 2.47 MIL/uL — ABNORMAL LOW (ref 3.87–5.11)
RBC: 2.9 MIL/uL — ABNORMAL LOW (ref 3.87–5.11)
RDW: 14.6 % (ref 11.5–15.5)
RDW: 14 % (ref 11.5–15.5)
WBC: 7.1 10*3/uL (ref 4.0–10.5)
WBC: 7.9 10*3/uL (ref 4.0–10.5)
nRBC: 0 % (ref 0.0–0.2)
nRBC: 0 % (ref 0.0–0.2)

## 2020-07-13 LAB — URINALYSIS, ROUTINE W REFLEX MICROSCOPIC
Bilirubin Urine: NEGATIVE
Glucose, UA: NEGATIVE mg/dL
Ketones, ur: NEGATIVE mg/dL
Nitrite: NEGATIVE
Protein, ur: NEGATIVE mg/dL
Specific Gravity, Urine: 1.005 (ref 1.005–1.030)
pH: 7 (ref 5.0–8.0)

## 2020-07-13 LAB — COMPREHENSIVE METABOLIC PANEL
ALT: 16 U/L (ref 0–44)
AST: 49 U/L — ABNORMAL HIGH (ref 15–41)
Albumin: 1.7 g/dL — ABNORMAL LOW (ref 3.5–5.0)
Alkaline Phosphatase: 55 U/L (ref 38–126)
Anion gap: 5 (ref 5–15)
BUN: 5 mg/dL — ABNORMAL LOW (ref 8–23)
CO2: 21 mmol/L — ABNORMAL LOW (ref 22–32)
Calcium: 6.4 mg/dL — CL (ref 8.9–10.3)
Chloride: 84 mmol/L — ABNORMAL LOW (ref 98–111)
Creatinine, Ser: <0.3 mg/dL — ABNORMAL LOW (ref 0.44–1.00)
Glucose, Bld: 83 mg/dL (ref 70–99)
Potassium: 5.4 mmol/L — ABNORMAL HIGH (ref 3.5–5.1)
Sodium: 110 mmol/L — CL (ref 135–145)
Total Bilirubin: 1.2 mg/dL (ref 0.3–1.2)
Total Protein: 3.2 g/dL — ABNORMAL LOW (ref 6.5–8.1)

## 2020-07-13 LAB — SARS CORONAVIRUS 2 (TAT 6-24 HRS): SARS Coronavirus 2: NEGATIVE

## 2020-07-13 LAB — CBG MONITORING, ED: Glucose-Capillary: 104 mg/dL — ABNORMAL HIGH (ref 70–99)

## 2020-07-13 LAB — OSMOLALITY, URINE: Osmolality, Ur: 269 mOsm/kg — ABNORMAL LOW (ref 300–900)

## 2020-07-13 LAB — SODIUM: Sodium: 110 mmol/L — CL (ref 135–145)

## 2020-07-13 LAB — BASIC METABOLIC PANEL
Anion gap: 6 (ref 5–15)
BUN: <5 mg/dL — ABNORMAL LOW (ref 8–23)
CO2: 25 mmol/L (ref 22–32)
Calcium: 7.5 mg/dL — ABNORMAL LOW (ref 8.9–10.3)
Chloride: 78 mmol/L — ABNORMAL LOW (ref 98–111)
Creatinine, Ser: <0.3 mg/dL — ABNORMAL LOW (ref 0.44–1.00)
Glucose, Bld: 89 mg/dL (ref 70–99)
Potassium: 3.1 mmol/L — ABNORMAL LOW (ref 3.5–5.1)
Sodium: 109 mmol/L — CL (ref 135–145)

## 2020-07-13 LAB — LACTIC ACID, PLASMA: Lactic Acid, Venous: 0.8 mmol/L (ref 0.5–1.9)

## 2020-07-13 LAB — OSMOLALITY: Osmolality: 235 mOsm/kg — CL (ref 275–295)

## 2020-07-13 LAB — PROCALCITONIN: Procalcitonin: <0.1 ng/mL

## 2020-07-13 LAB — POC OCCULT BLOOD, ED: Fecal Occult Bld: POSITIVE — AB

## 2020-07-13 LAB — CORTISOL-AM, BLOOD: Cortisol - AM: 12.6 ug/dL (ref 6.7–22.6)

## 2020-07-13 LAB — MAGNESIUM: Magnesium: 1.2 mg/dL — ABNORMAL LOW (ref 1.7–2.4)

## 2020-07-13 LAB — HEMOGLOBIN A1C
Hgb A1c MFr Bld: 6.8 % — ABNORMAL HIGH (ref 4.8–5.6)
Mean Plasma Glucose: 148.46 mg/dL

## 2020-07-13 LAB — PHOSPHORUS: Phosphorus: 2.4 mg/dL — ABNORMAL LOW (ref 2.5–4.6)

## 2020-07-13 LAB — SODIUM, URINE, RANDOM: Sodium, Ur: 98 mmol/L

## 2020-07-13 LAB — GLUCOSE, CAPILLARY
Glucose-Capillary: 92 mg/dL (ref 70–99)
Glucose-Capillary: 77 mg/dL (ref 70–99)

## 2020-07-13 MED ORDER — SODIUM CHLORIDE 3 % IV SOLN: INTRAVENOUS | Status: DC

## 2020-07-13 MED ORDER — SODIUM CHLORIDE 0.9 % IV SOLN: INTRAVENOUS | Status: DC

## 2020-07-13 MED ORDER — SODIUM CHLORIDE 0.9 % IV BOLUS
500.0000 mL | Freq: Once | INTRAVENOUS | Status: AC
  Administered 2020-07-13: 500 mL via INTRAVENOUS

## 2020-07-13 MED ORDER — CALCIUM GLUCONATE-NACL 1-0.675 GM/50ML-% IV SOLN
1.0000 g | Freq: Once | INTRAVENOUS | Status: AC
  Administered 2020-07-13: 1000 mg via INTRAVENOUS
  Filled 2020-07-13: qty 50

## 2020-07-13 MED ORDER — CLONIDINE HCL 0.1 MG PO TABS
0.1000 mg | ORAL_TABLET | Freq: Two times a day (BID) | ORAL | Status: DC
  Administered 2020-07-14: 0.1 mg via ORAL
  Filled 2020-07-13: qty 1

## 2020-07-13 MED ORDER — INSULIN ASPART 100 UNIT/ML ~~LOC~~ SOLN
0.0000 [IU] | SUBCUTANEOUS | Status: DC
  Administered 2020-07-14 (×2): 3 [IU] via SUBCUTANEOUS
  Administered 2020-07-14: 5 [IU] via SUBCUTANEOUS
  Administered 2020-07-15 (×2): 3 [IU] via SUBCUTANEOUS
  Administered 2020-07-15: 8 [IU] via SUBCUTANEOUS
  Administered 2020-07-15 (×2): 3 [IU] via SUBCUTANEOUS
  Administered 2020-07-16 (×2): 5 [IU] via SUBCUTANEOUS
  Administered 2020-07-16 (×2): 3 [IU] via SUBCUTANEOUS
  Administered 2020-07-16: 2 [IU] via SUBCUTANEOUS
  Administered 2020-07-16 – 2020-07-17 (×3): 5 [IU] via SUBCUTANEOUS
  Administered 2020-07-17: 3 [IU] via SUBCUTANEOUS
  Administered 2020-07-17: 5 [IU] via SUBCUTANEOUS

## 2020-07-13 MED ORDER — SODIUM CHLORIDE 0.9 % IV SOLN
INTRAVENOUS | Status: DC | PRN
  Administered 2020-07-13 – 2020-07-14 (×2): 250 mL via INTRAVENOUS

## 2020-07-13 MED ORDER — SODIUM CHLORIDE 3 % IV SOLN
INTRAVENOUS | Status: DC
  Administered 2020-07-14: 30 mL/h via INTRAVENOUS
  Filled 2020-07-13 (×3): qty 500

## 2020-07-13 MED ORDER — SODIUM CHLORIDE 0.9 % IV SOLN
1.0000 g | INTRAVENOUS | Status: AC
  Administered 2020-07-13 – 2020-07-17 (×5): 1 g via INTRAVENOUS
  Filled 2020-07-13 (×4): qty 10

## 2020-07-13 MED ORDER — HEPARIN SODIUM (PORCINE) 5000 UNIT/ML IJ SOLN
5000.0000 [IU] | Freq: Three times a day (TID) | INTRAMUSCULAR | Status: DC
  Administered 2020-07-13 – 2020-07-23 (×30): 5000 [IU] via SUBCUTANEOUS
  Filled 2020-07-13 (×30): qty 1

## 2020-07-13 MED ORDER — CHLORHEXIDINE GLUCONATE CLOTH 2 % EX PADS
6.0000 | MEDICATED_PAD | Freq: Every day | CUTANEOUS | Status: DC
  Administered 2020-07-13 – 2020-07-23 (×9): 6 via TOPICAL

## 2020-07-13 MED ORDER — POTASSIUM CHLORIDE 10 MEQ/100ML IV SOLN
10.0000 meq | INTRAVENOUS | Status: AC
  Administered 2020-07-14: 10 meq via INTRAVENOUS
  Filled 2020-07-13 (×3): qty 100

## 2020-07-13 NOTE — Progress Notes (Signed)
Overnight floor coverage progress note  Patient hypothermic with rectal temperature 95.8 F.  Slightly bradycardic with heart rate in the 50s to 60s.  Normotensive.  Labs done on admission showing no leukocytosis.  ?Underlying infection/UTI. UA with moderate amount of leukocytes, 21-50 WBCs, and rare bacteria.  -Start ceftriaxone.  Order urine and blood cultures.  Check lactate and procalcitonin level. -Chest x-ray to rule out pneumonia -TSH pending, a.m. cortisol pending -Bear hugger and monitor temperature closely

## 2020-07-13 NOTE — Progress Notes (Signed)
Critical urine serum osmolality 235.  Dr. Loney Loh paged at 2041.  Orders received.

## 2020-07-13 NOTE — ED Provider Notes (Signed)
[facility] EMERGENCY DEPARTMENT Provider Note   CSN: [id number] Arrival date & time: 07/13/20  1112     History Chief Complaint  Patient presents with  . Fatigue    Difficult to rouse    Suzanne Fox is a 87 y.o. female.  HPI She presents for evaluation of altered mental status with hypotension.  She is currently at a nursing care facility.  She was hospitalized and had cervical spine surgery about 6 weeks ago.  She has been in rehab since hospital discharge.  According to her niece, who is here with her in the room, in the last 2 days the patient has become less alert, weaker and eating less.  Also, her blood pressure medicines have been modified because of "low sodium."  Additionally she has been treated for both pneumonia and UTI, while at the rehab.  The patient is unable to give any history because of mild dementia.  Level 5 caveat-dementia    History reviewed. No pertinent past medical history.  Patient Active Problem List   Diagnosis Date Noted  . Hyponatremia 07/13/2020    History reviewed. No pertinent surgical history.   OB History   No obstetric history on file.     History reviewed. No pertinent family history.     Home Medications Prior to Admission medications   Medication Sig Start Date End Date Taking? Authorizing Provider  acetaminophen (TYLENOL) 325 MG tablet Take 650 mg by mouth every 4 (four) hours as needed for mild pain.   Yes [provider]  amLODipine (NORVASC) 10 MG tablet Take 10 mg by mouth daily.   Yes [provider]  aspirin EC 81 MG tablet    Yes [provider]  atorvastatin (LIPITOR) 40 MG tablet Take 40 mg by mouth daily.   Yes [provider]  cloNIDine (CATAPRES) 0.1 MG tablet Take 0.1 mg by mouth 2 (two) times daily.   Yes [provider]  feeding supplement, GLUCERNA SHAKE, (GLUCERNA SHAKE) LIQD Take 237 mLs by mouth 2 (two) times daily between meals.   Yes  [provider]  insulin aspart (NOVOLOG FLEXPEN) 100 UNIT/ML FlexPen Inject 0-10 Units into the skin See admin instructions. Per sliding scale 3 times daily with meals  0-150=0 units 151-200=2 units 201-250=4 units 251-300=6 units 301-350=8 units 351-400= 10 units   Yes [provider]  insulin glargine (LANTUS) 100 UNIT/ML injection Inject 10 Units into the skin daily.   Yes [provider]  levothyroxine (SYNTHROID) 25 MCG tablet Take 25 mcg by mouth daily before breakfast.   Yes [provider]  lisinopril (ZESTRIL) 40 MG tablet Take 80 mg by mouth daily.   Yes [provider]  memantine (NAMENDA) 10 MG tablet Take 10 mg by mouth daily.   Yes [provider]  methocarbamol (ROBAXIN) 500 MG tablet Take 500 mg by mouth every 6 (six) hours as needed for muscle spasms.   Yes [provider]  sodium chloride 1 g tablet Take 1 g by mouth 3 (three) times daily.   Yes [provider]  atenolol-chlorthalidone (TENORETIC) 50-25 MG tablet Take 1 tablet by mouth daily. Patient not taking: No sig reported    [provider]  cefTRIAXone (ROCEPHIN) 1 g injection Inject 1 g into the muscle daily. For 7 days Patient not taking: No sig reported    [provider]  levofloxacin (LEVAQUIN) 750 MG tablet Take 750 mg by mouth daily. For 7 days Patient not  taking: No sig reported    [provider]    Allergies    Aricept [donepezil hcl] and Sulfa antibiotics  Review of Systems   Review of Systems  Unable to perform ROS: Dementia    Physical Exam Updated Vital Signs BP (!) 128/47   Pulse (!) 50   Temp (!) 96.7 F (35.9 C) (Rectal)   Resp 10   SpO2 98%   Physical Exam Vitals and nursing note reviewed.  Constitutional:      General: She is not in acute distress.    Appearance: She is well-developed. She is not ill-appearing, toxic-appearing or diaphoretic.     Comments: Elderly, frail  HENT:      Head: Normocephalic and atraumatic.     Right Ear: External ear normal.     Left Ear: External ear normal.     Mouth/Throat:     Mouth: Mucous membranes are moist.     Pharynx: No posterior oropharyngeal erythema.  Eyes:     Conjunctiva/sclera: Conjunctivae normal.     Pupils: Pupils are equal, round, and reactive to light.  Neck:     Trachea: Phonation normal.  Cardiovascular:     Rate and Rhythm: Normal rate and regular rhythm.     Heart sounds: Normal heart sounds.     Comments: PICC line present, medial right upper arm.  Site and appliance appeared normal. Pulmonary:     Effort: Pulmonary effort is normal.     Breath sounds: Normal breath sounds.  Abdominal:     General: There is no distension.     Palpations: Abdomen is soft.     Tenderness: There is no abdominal tenderness.  Musculoskeletal:        General: Normal range of motion.     Cervical back: Normal range of motion and neck supple.     Right lower leg: Edema present.     Left lower leg: Edema present.     Comments: Right upper arm swelling, compression glove on right arm from mid humeral area to fingertips.  Skin:    General: Skin is warm and dry.  Neurological:     Mental Status: She is alert.     Cranial Nerves: No cranial nerve deficit.     Motor: No abnormal muscle tone.     Coordination: Coordination normal.  Psychiatric:        Mood and Affect: Mood normal.        Behavior: Behavior normal.     ED Results / Procedures / Treatments   Labs (all labs ordered are listed, but only abnormal results are displayed) Labs Reviewed  COMPREHENSIVE METABOLIC PANEL - Abnormal; Notable for the following components:      Result Value   Sodium 110 (*)    Potassium 5.4 (*)    Chloride 84 (*)    CO2 21 (*)    BUN 5 (*)    Creatinine, Ser <0.30 (*)    Calcium 6.4 (*)    Total Protein 3.2 (*)    Albumin 1.7 (*)    AST 49 (*)    All other components within normal limits  CBC - Abnormal; Notable for the  following components:   RBC 2.47 (*)    Hemoglobin 6.5 (*)    HCT 20.4 (*)    All other components within normal limits  CBG MONITORING, ED - Abnormal; Notable for the following components:   Glucose-Capillary 104 (*)    All other components within normal limits  POC OCCULT BLOOD, ED - Abnormal; Notable for the following components:   Fecal Occult Bld POSITIVE (*)    All other components within normal limits  SARS CORONAVIRUS 2 (TAT 6-24 HRS)  SODIUM  SODIUM  URINALYSIS, ROUTINE W REFLEX MICROSCOPIC  SODIUM, URINE, RANDOM  OSMOLALITY, URINE  OSMOLALITY  MAGNESIUM  PHOSPHORUS  CBG MONITORING, ED    EKG EKG Interpretation  Date/Time:  Tuesday July 13 2020 11:31:50 EDT Ventricular Rate:  54 PR Interval:    QRS Duration: 69 QT Interval:  442 QTC Calculation: 419 R Axis:   69 Text Interpretation: Junctional rhythm Low voltage, extremity and precordial leads Nonspecific T abnormalities, lateral leads No old tracing to compare Confirmed by Mancel Bale [id number]) on 07/13/2020 2:43:35 PM   Radiology No results found.  Procedures .Critical Care Performed by: Mancel Bale, MD Authorized by: Mancel Bale, MD   Critical care provider statement:    Critical care time (minutes):  35   Critical care start time:  07/13/2020 12:10 PM   Critical care end time:  07/13/2020 3:17 PM   Critical care time was exclusive of:  Separately billable procedures and treating other patients   Critical care was necessary to treat or prevent imminent or life-threatening deterioration of the following conditions:  Metabolic crisis   Critical care was time spent personally by me on the following activities:  Blood draw for specimens, development of treatment plan with patient or surrogate, discussions with consultants, evaluation of patient's response to treatment, examination of patient, obtaining history from patient or surrogate, ordering and performing treatments and interventions, ordering and  review of laboratory studies, pulse oximetry, re-evaluation of patient's condition, review of old charts and ordering and review of radiographic studies     Medications Ordered in ED Medications  0.9 %  sodium chloride infusion (has no administration in time range)  calcium gluconate 1 g/ 50 mL sodium chloride IVPB (1,000 mg Intravenous New Bag/Given 07/13/20 1617)  sodium chloride 0.9 % bolus 500 mL (500 mLs Intravenous New Bag/Given 07/13/20 1623)    ED Course  I have reviewed the triage vital signs and the nursing notes.  Pertinent labs & imaging results that were available during my care of the patient were reviewed by me and considered in my medical decision making (see chart for details).  Clinical Course as of 07/13/20 1653  Tue Jul 13, 2020  1548 Sodium(!!): 110 [EW]  1550 3:35 PM-I received a call from on-call hospitalist, Dr. Luberta Robertson; she felt that this patient should be evaluated by the intensivist.  I contacted the APP intensivist, who stated that they would evaluate the case then let me know who they felt should admit the patient. [EW]  1652 Report from intensivist service, that the patient is DNR/DNI, and they recommended hospitalist admit the patient.  Hospitalist will be paged again for [EW]    Clinical Course User Index [EW] Mancel Bale, MD   MDM Rules/Calculators/A&P                           Patient Vitals for the past 24 hrs:  BP Temp Temp src Pulse Resp SpO2  07/13/20 1645 (!) 128/47 -- -- (!) 50 10 98 %  07/13/20 1630 (!) 147/48 -- -- (!) 57 16 100 %  07/13/20 1615 (!) 118/43 -- -- (!) 56 16 100 %  07/13/20 1600 (!) 124/51 -- -- 61 17 100 %  07/13/20 1545 (!) 124/49 -- -- Marland Kitchen  58 20 100 %  07/13/20 1530 (!) 127/52 -- -- (!) 53 11 99 %  07/13/20 1515 (!) 119/45 -- -- (!) 55 12 98 %  07/13/20 1500 (!) 118/48 -- -- (!) 54 14 99 %  07/13/20 1445 (!) 120/46 -- -- (!) 55 16 99 %  07/13/20 1430 (!) 119/44 -- -- (!) 58 15 98 %  07/13/20 1415 (!) 120/48 -- --  (!) 51 10 98 %  07/13/20 1400 (!) 119/49 -- -- (!) 52 16 98 %  07/13/20 1345 (!) 118/46 -- -- (!) 56 13 99 %  07/13/20 1330 (!) 111/45 -- -- (!) 54 16 97 %  07/13/20 1315 (!) 115/46 -- -- (!) 55 -- 98 %  07/13/20 1300 (!) 126/44 -- -- (!) 56 15 99 %  07/13/20 1245 (!) 113/45 -- -- (!) 50 -- 98 %  07/13/20 1230 (!) 114/42 -- -- (!) 55 12 98 %  07/13/20 1215 (!) 120/45 -- -- (!) 56 14 99 %  07/13/20 1200 (!) 115/45 -- -- (!) 58 16 100 %  07/13/20 1132 -- (!) 96.7 F (35.9 C) Rectal (!) 54 12 --  07/13/20 1130 (!) 118/48 -- -- -- 13 --  07/13/20 1121 -- -- -- -- -- 100 %    3:10 PM Reevaluation with update and discussion. After initial assessment and treatment, an updated evaluation reveals no change in clinical status, findings discussed with patient's daughter, and all questions answered.  She understands the patient will require hospitalization.Mancel Bale   Medical Decision Making:  This patient is presenting for evaluation of weakness, and altered mental status, which does require a range of treatment options, and is a complaint that involves a high risk of morbidity and mortality. The differential diagnoses include acute infection, metabolic disorder, hemodynamic instability. I decided to review old records, and in summary elderly female, residing in a nursing care facility following surgery, 6 weeks ago.  She has known hypokalemia presents with weakness and altered mental status.  I obtained additional historical information from daughter at Bedside.  Clinical Laboratory Tests Ordered, included CBC, Metabolic panel, Urinalysis and COVID test. Review indicates normal except sodium low, potassium high, chloride low, CO2 low, BUN low, creatinine low, total protein low, albumin low, AST low, hemoglobin low.   Critical Interventions-clinical evaluation, laboratory testing, IV fluids, observation and reassessment  After These Interventions, the Patient was reevaluated and was found  with severe hyponatremia and anemia.  She has significant abnormalities from baseline about 1 month ago, requiring hospitalization for stabilization.  Findings include low sodium, low calcium, low hemoglobin.  CRITICAL CARE-yes Performed by: Mancel Bale  Nursing Notes Reviewed/ Care Coordinated Applicable Imaging Reviewed Interpretation of Laboratory Data incorporated into ED treatment  3:25 PM-Consult complete with hospitalist. Patient case explained and discussed.  She agrees to admit patient for further evaluation and treatment. Call ended at 4:55 PM  Plan: Admit  Final Clinical Impression(s) / ED Diagnoses Final diagnoses:  Altered mental status, unspecified altered mental status type  Hyponatremia  Anemia, unspecified type  Hypocalcemia    Rx / DC Orders ED Discharge Orders    None       Mancel Bale, MD 07/13/20 [phone]

## 2020-07-13 NOTE — H&P (Signed)
History and Physical:    Suzanne Fox   CVE:[id number] DOB: [birth year] DOA: 07/13/2020  Referring MD/provider: Dr. Effie Shy PCP: Georgianne Fick, MD   Patient coming from: [facility]  Chief Complaint: Altered mental state  History of Present Illness:   Suzanne Fox is an 87 y.o. female with PMH significant for HTN, DM2, dementia and recent cervical surgery for hematoma who lives at [facility] healthcare is brought in for somnolence.  History as per ED documentation and her niece who is at bedside.  Patient apparently has been lethargic for the last couple of days.  Niece was told that her sodium was low however she does not know how low.  She was told that medications were being changed because of the low sodium.  Yesterday she noted that her aunt was not eating as much as usual and was very sleepy.  This morning patient was unable to be aroused by the nursing home staff so was sent to the ED.  Patient herself is unable to provide an adequate history.  He states she understand that she is in the hospital.  She denies any pain or discomfort.  ED Course:  The patient was noted to have a temperature of 96.7 and was relatively normotensive.  Heart rate was 60.  She was sleepy however was able to be aroused with sternal rub.  Laboratory data was notable for a sodium of 110 and potassium of 5.4.  She was treated with normal saline and did wake up and was able to answer yes/no questions appropriately.  Patient was called in for admission to [facility] hospitalist.  I asked for PCCM evaluation, they did come and evaluate her and thought she was okay to go to the floor.  ROS:   ROS   Review of Systems: Unable to provide due to somnolence and dementia  Past Medical History:   History reviewed. No pertinent past medical history.  Past Surgical History:   History reviewed. No pertinent surgical history.  Social History:   Social History   Socioeconomic History  . Marital  status: Widowed    Spouse name: Not on file  . Number of children: Not on file  . Years of education: Not on file  . Highest education level: Not on file  Occupational History  . Not on file  Tobacco Use  . Smoking status: Not on file  . Smokeless tobacco: Not on file  Substance and Sexual Activity  . Alcohol use: Not on file  . Drug use: Not on file  . Sexual activity: Not on file  Other Topics Concern  . Not on file  Social History Narrative  . Not on file   Social Determinants of Health   Financial Resource Strain: Not on file  Food Insecurity: Not on file  Transportation Needs: Not on file  Physical Activity: Not on file  Stress: Not on file  Social Connections: Not on file  Intimate Partner Violence: Not on file    Allergies   Aricept [donepezil hcl] and Sulfa antibiotics  Family history:   History reviewed. No pertinent family history.  Current Medications:   Prior to Admission medications   Medication Sig Start Date End Date Taking? Authorizing Provider  acetaminophen (TYLENOL) 325 MG tablet Take 650 mg by mouth every 4 (four) hours as needed for mild pain.   Yes [provider]  amLODipine (NORVASC) 10 MG tablet Take 10 mg by mouth daily.   Yes [provider]  aspirin EC 81 MG tablet    Yes [provider]  atorvastatin (LIPITOR) 40 MG tablet Take 40 mg by mouth daily.   Yes [provider]  cloNIDine (CATAPRES) 0.1 MG tablet Take 0.1 mg by mouth 2 (two) times daily.   Yes [provider]  feeding supplement, GLUCERNA SHAKE, (GLUCERNA SHAKE) LIQD Take 237 mLs by mouth 2 (two) times daily between meals.   Yes [provider]  insulin aspart (NOVOLOG FLEXPEN) 100 UNIT/ML FlexPen Inject 0-10 Units into the skin See admin instructions. Per sliding scale 3 times daily with meals  0-150=0 units 151-200=2 units 201-250=4 units 251-300=6 units 301-350=8 units 351-400= 10 units   Yes [provider]   insulin glargine (LANTUS) 100 UNIT/ML injection Inject 10 Units into the skin daily.   Yes [provider]  levothyroxine (SYNTHROID) 25 MCG tablet Take 25 mcg by mouth daily before breakfast.   Yes [provider]  lisinopril (ZESTRIL) 40 MG tablet Take 80 mg by mouth daily.   Yes [provider]  memantine (NAMENDA) 10 MG tablet Take 10 mg by mouth daily.   Yes [provider]  methocarbamol (ROBAXIN) 500 MG tablet Take 500 mg by mouth every 6 (six) hours as needed for muscle spasms.   Yes [provider]  sodium chloride 1 g tablet Take 1 g by mouth 3 (three) times daily.   Yes [provider]  atenolol-chlorthalidone (TENORETIC) 50-25 MG tablet Take 1 tablet by mouth daily. Patient not taking: No sig reported    [provider]  cefTRIAXone (ROCEPHIN) 1 g injection Inject 1 g into the muscle daily. For 7 days Patient not taking: No sig reported    [provider]  levofloxacin (LEVAQUIN) 750 MG tablet Take 750 mg by mouth daily. For 7 days Patient not taking: No sig reported    [provider]    Physical Exam:   Vitals:   07/13/20 1615 07/13/20 1630 07/13/20 1645 07/13/20 1726  BP: (!) 118/43 (!) 147/48 (!) 128/47   Pulse: (!) 56 (!) 57 (!) 50   Resp: 16 16 10    Temp:      TempSrc:      SpO2: 100% 100% 98%   Weight:    57.2 kg  Height:    5\' 5"  (1.651 m)     Physical Exam: Blood pressure (!) 128/47, pulse (!) 50, temperature (!) 96.7 F (35.9 C), temperature source Rectal, resp. rate 10, height 5\' 5"  (1.651 m), weight 57.2 kg, SpO2 98 %. Gen: Elderly female lying flat in bed in no acute distress. Eyes: sclera anicteric, conjuctiva mildly injected bilaterally CVS: S1-S2, regulary, no gallops Respiratory:  decreased air entry likely secondary to decreased inspiratory effort GI: NABS, soft, NT  LE: Her feet are in booties to prevent pressure necrosis per niece report.   Neuro: Somnolent, GCS is  12 Psych: Somnolent  Data Review:    Labs: Basic Metabolic Panel: Recent Labs  Lab 07/13/20 1138  NA 110*  K 5.4*  CL 84*  CO2 21*  GLUCOSE 83  BUN 5*  CREATININE <0.30*  CALCIUM 6.4*   Liver Function Tests: Recent Labs  Lab 07/13/20 1138  AST 49*  ALT 16  ALKPHOS 55  BILITOT 1.2  PROT 3.2*  ALBUMIN 1.7*   No results for input(s): LIPASE, AMYLASE in the last 168 hours. No results for input(s): AMMONIA in the last 168 hours. CBC: Recent Labs  Lab 07/13/20 1138  WBC 7.1  HGB 6.5*  HCT 20.4*  MCV 82.6  PLT 200   Cardiac Enzymes: No results for input(s): CKTOTAL, CKMB, CKMBINDEX, TROPONINI in the last 168 hours.  BNP (last 3 results) No results for input(s): PROBNP in the last 8760 hours. CBG: Recent Labs  Lab 07/13/20 1127  GLUCAP 104*    Urinalysis No results found for: COLORURINE, APPEARANCEUR, LABSPEC, PHURINE, GLUCOSEU, HGBUR, BILIRUBINUR, KETONESUR, PROTEINUR, UROBILINOGEN, NITRITE, LEUKOCYTESUR    Radiographic Studies: No results found.  EKG: Independently reviewed. Junctional rhythm at 54. Nl axis. Diffusely flattened T waves.   Assessment/Plan:   Principal Problem:   Hyponatremia Active Problems:   Benign essential HTN   Type 2 diabetes mellitus without complication (HCC)  87 year old female was admitted with profound hyponatremia likely secondary to chlorthalidone and decreased p.o. intake complicated by somnolence.  Significant hyponatremia complicated by somnolence Is likely secondary to chlorthalidone use and likely decreased p.o. intake. Evaluated by PCCM who declined to take the patient Discussed with Dr Hyman Hopes who will consult on the patient Will start patient on normal saline at 75 cc an hour BMP every 3 hours to make sure were not going up too fast, no more than 6-8 in 24 hours Urine sodium and osmolality ordered A.m. cortisol and TSH also ordered We will keep patient n.p.o. except sips with meds until she is more  awake  HTN Hold lisinopril and amlodipine Continue clonidine 0.1 mg twice daily per home dose Patient's Tenoretic (atenolol-chlorthalidone) was held yesterday at nursing home, this needs to be discontinued.  Cervical dysfunction Patient was getting rehab at [facility] Can consider restarting PT and OT when patient is more awake.  DM2 Patient is too somnolent to eat right now Hold glargine SSI every 4 hours ordered  Hypothyroidism Hold Synthroid right now because she is so somnolent, this can be restarted once she is able to take p.o. safely  Dementia Hold memantine   Other information:   DVT prophylaxis: Subcu heparin ordered. Code Status: DNR Family Communication: Patient's niece and next of kin was at bed throughout Disposition Plan: [facility] Consults called: PCCM, nephrology Admission status: Inpatient  Suzanne Fox [facility]  If 7PM-7AM, please contact night-coverage [email or web address] Password [facility] 07/13/2020, 5:43 PM

## 2020-07-13 NOTE — H&P (Signed)
NAME:  Suzanne Fox, MRN:  [id number], DOB:  1929/07/03, LOS: 0 ADMISSION DATE:  07/13/2020, CONSULTATION DATE:  07/13/20 REFERRING MD: Kathrene Bongo , CHIEF COMPLAINT:  Fatigue, hyponatremia  History of Present Illness:  Suzanne Fox is an 87 year old woman who presented to ED with reported lethargy, bradycardia, and hypotension; from [facility].  Reduced mentation for two days per family.  Given 700 ml bolus with improvement of BP and mentation. Recent admission 3/17-3/25 with spontaneous epidural hematoma requiring C spine decompression and fusion. At discharge patient with hyponatremic with NA 129. Started on 1g BID salt tabs. Of note patient was also on chlorthalidone for HTN.   On arrival to ED NA 110. K 5.4 (with slight hemolysis). Cl 84. Albumin 1.7. Hemoglobin 6.5. Niece reported after recent discharge patient was sent to rehab. Patient does have mild dementia at baseline however able to recognize family members. Question poor oral intake along with continuation of chlorthalidone.   Initially admitted to hospitalist after intensivist team evaluated.  Pertinent  Medical History   Tobacco abuse, PAD,Hypothyroidism,Hypertension,Hyperlipidemia, diabetes type 2, dementia, arthritis,  Recent hx of PNA and UTI 3/17-3/25 with spontaneous epidural hematoma requiring C spine decompression and fusion.  Significant Hospital Events: Including procedures, antibiotic start and stop dates in addition to other pertinent events   . 4/19 admitted   Interim History / Subjective:    Objective   Blood pressure (!) 117/45, pulse (!) 55, temperature (!) 97.3 F (36.3 C), temperature source Oral, resp. rate 14, height 5\' 5"  (1.651 m), weight 57.2 kg, SpO2 97 %.       No intake or output data in the 24 hours ending 07/13/20 2205 Filed Weights   07/13/20 1726  Weight: 57.2 kg    Examination: General: Elderly female, no distress, follows simple commands, not speaking HENT: MMM   Lungs: no use of accessory muscles  Cardiovascular: 07/15/20, no MRG Abdomen: Soft, non-tender, non-distended  Extremities:  dry cracked skin, pressure injury noted to left heel , 1 + pitting edema in RUE Neuro: alert, oriented to self, follows simple commands  GU: deferred   Labs/imaging that I havepersonally reviewed  (right click and "Reselect all SmartList Selections" daily)  CXR: B pleural effusions BMP, CBC  Resolved Hospital Problem list     Assessment & Plan:  Hyponatremia  -Initially thought 2/2 poor solute intake, chlorthalidone.   -consider SIADH, consider hyperaldosterone Plan -Continue to hold chlorthalidone -Frequent Neuro Examinations  -Urine Studies ordered -starting hypertonic saline per nephrology recommendations  Hypokalemia: replete  UTI: mod leukocytosis.  Hospitalist team started CTX due to hypothermia and UA findings.  Lactate and Procal pending.   DM: iss lantus 10 u daily at home  Hypothyroid: levoxyl 25 mcg daily  HTN: amlodipine, lisinopril, clonidine, atenolol - held  HLD: lipitor  Dementia: namenda  R arm edema - doppler Huston Foley P  Best practice (right click and "Reselect all SmartList Selections" daily)  Diet:  NPO Pain/Anxiety/Delirium protocol (if indicated): No VAP protocol (if indicated): Not indicated DVT prophylaxis: Subcutaneous Heparin GI prophylaxis: N/A Glucose control:  SSI Yes Central venous access:  N/A Arterial line:  N/A Foley:  Yes, and it is still needed Mobility:  bed rest  PT consulted: N/A Last date of multidisciplinary goals of care discussion []  Code Status:  DNR Disposition: ICU  Labs   CBC: Recent Labs  Lab 07/13/20 1138 07/13/20 1700  WBC 7.1 7.9  HGB 6.5* 7.6*  HCT 20.4* 22.6*  MCV 82.6 77.9*  PLT 200 238    Basic Metabolic Panel: Recent Labs  Lab 07/13/20 1138 07/13/20 1700  NA 110* 110*  K 5.4*  --   CL 84*  --   CO2 21*  --   GLUCOSE 83  --   BUN 5*  --   CREATININE <0.30*  --    CALCIUM 6.4*  --   MG  --  1.2*  PHOS  --  2.4*   GFR: CrCl cannot be calculated (This lab value cannot be used to calculate CrCl because it is not a number: <0.30). Recent Labs  Lab 07/13/20 1138 07/13/20 1700  WBC 7.1 7.9    Liver Function Tests: Recent Labs  Lab 07/13/20 1138  AST 49*  ALT 16  ALKPHOS 55  BILITOT 1.2  PROT 3.2*  ALBUMIN 1.7*   No results for input(s): LIPASE, AMYLASE in the last 168 hours. No results for input(s): AMMONIA in the last 168 hours.  ABG No results found for: PHART, PCO2ART, PO2ART, HCO3, TCO2, ACIDBASEDEF, O2SAT   Coagulation Profile: No results for input(s): INR, PROTIME in the last 168 hours.  Cardiac Enzymes: No results for input(s): CKTOTAL, CKMB, CKMBINDEX, TROPONINI in the last 168 hours.  HbA1C: Hgb A1c MFr Bld  Date/Time Value Ref Range Status  07/13/2020 07:33 PM 6.8 (H) 4.8 - 5.6 % Final    Comment:    (NOTE) Pre diabetes:          5.7%-6.4%  Diabetes:              >6.4%  Glycemic control for   <7.0% adults with diabetes     CBG: Recent Labs  Lab 07/13/20 1127 07/13/20 1955  GLUCAP 104* 92    Review of Systems:   unablet o assess due to lethargy  Past Medical History:  She,  has no past medical history on file.   Surgical History:  History reviewed. No pertinent surgical history.   Social History:      Family History:  Her family history is not on file.   Allergies Allergies  Allergen Reactions  . Aricept [Donepezil Hcl]     Per MAR  . Sulfa Antibiotics     Per MAR      Home Medications  Prior to Admission medications   Medication Sig Start Date End Date Taking? Authorizing Provider  acetaminophen (TYLENOL) 325 MG tablet Take 650 mg by mouth every 4 (four) hours as needed for mild pain.   Yes [provider]  amLODipine (NORVASC) 10 MG tablet Take 10 mg by mouth daily.   Yes [provider]  aspirin EC 81 MG tablet    Yes [provider]  atorvastatin  (LIPITOR) 40 MG tablet Take 40 mg by mouth daily.   Yes [provider]  cloNIDine (CATAPRES) 0.1 MG tablet Take 0.1 mg by mouth 2 (two) times daily.   Yes [provider]  feeding supplement, GLUCERNA SHAKE, (GLUCERNA SHAKE) LIQD Take 237 mLs by mouth 2 (two) times daily between meals.   Yes [provider]  insulin aspart (NOVOLOG FLEXPEN) 100 UNIT/ML FlexPen Inject 0-10 Units into the skin See admin instructions. Per sliding scale 3 times daily with meals  0-150=0 units 151-200=2 units 201-250=4 units 251-300=6 units 301-350=8 units 351-400= 10 units   Yes [provider]  insulin glargine (LANTUS) 100 UNIT/ML injection Inject 10 Units into the skin daily.   Yes [provider]  levothyroxine (SYNTHROID) 25 MCG tablet Take 25 mcg by  mouth daily before breakfast.   Yes [provider]  lisinopril (ZESTRIL) 40 MG tablet Take 80 mg by mouth daily.   Yes [provider]  memantine (NAMENDA) 10 MG tablet Take 10 mg by mouth daily.   Yes [provider]  methocarbamol (ROBAXIN) 500 MG tablet Take 500 mg by mouth every 6 (six) hours as needed for muscle spasms.   Yes [provider]  sodium chloride 1 g tablet Take 1 g by mouth 3 (three) times daily.   Yes [provider]  atenolol-chlorthalidone (TENORETIC) 50-25 MG tablet Take 1 tablet by mouth daily. Patient not taking: No sig reported    [provider]  cefTRIAXone (ROCEPHIN) 1 g injection Inject 1 g into the muscle daily. For 7 days Patient not taking: No sig reported    [provider]  levofloxacin (LEVAQUIN) 750 MG tablet Take 750 mg by mouth daily. For 7 days Patient not taking: No sig reported    [provider]     Critical care time: 45 min

## 2020-07-13 NOTE — Consult Note (Addendum)
NAME:  Suzanne Fox, MRN:  [id number], DOB:  1929/06/03, LOS: 0 ADMISSION DATE:  07/13/2020, CONSULTATION DATE:  07/13/2020 REFERRING MD:  Dr. Effie Shy, CHIEF COMPLAINT:  Hyponateremia   History of Present Illness:  87 year old female presents to ED with reported lethargy, bradycardia, and hypotension. Given 700 ml bolus with improvement of BP and mentation. Recent admission 3/17-3/25 with spontaneous epidural hematoma requiring C spine decompression and fusion. At discharge patient with hyponatremic with NA 129. Started on 1g BID salt tabs. Of note patient was also on chlorthalidone for HTN.   On arrival to ED NA 110. K 5.4 (with slight hemolysis). Cl 84. Albumin 1.7. Hemoglobin 6.5. Niece reports after recent discharge patient was sent to rehab. Patient does have mild dementia at baseline however able to recognize family members. Question poor oral intake along with continuation of chlorthalidone.   Pertinent  Medical History  Tobacco abuse, PAD, Hypothyroidism, Hypertension, Hyperlipidemia, diabetes, dementia, arthritis,   Significant Hospital Events: Including procedures, antibiotic start and stop dates in addition to other pertinent events   . 4/19 Presents to ED   Interim History / Subjective:  As above.   Objective   Blood pressure (!) 128/47, pulse (!) 50, temperature (!) 96.7 F (35.9 C), temperature source Rectal, resp. rate 10, SpO2 98 %.       No intake or output data in the 24 hours ending 07/13/20 1650 There were no vitals filed for this visit.  Examination: General: Elderly female, no distress  HENT: DRY MM  Lungs: no use of accessory muscles  Cardiovascular: Brady, no MRG Abdomen: Soft, non-tender, non-distended  Extremities: -edema, dry cracked skin, pressure injury noted to left heel  Neuro: alert, oriented to self, follows simple commands  GU: deferred   Labs/imaging that I havepersonally reviewed  (right click and "Reselect all SmartList Selections" daily)   Labs  Resolved Hospital Problem list     Assessment & Plan:   Hyponatremia in setting of suspected dehydration/poor nutrition and use of chlorthalidone -Patient is not having seizures and mentation is at her baseline function  Plan -Continue hydration. No indications for hypertonic therapy at this time -Continue to hold chlorthalidone -Frequent Neuro Examinations  -Urine Studies ordered -Trend sodium q6h with goal to increase no more than 12 meq over first 24 hours.    At this time patient is stable for admit to progressive care with [facility].   Family at bedside. Reports patient is a DNR/DNI. Code status updated in EMR.  Labs   CBC: Recent Labs  Lab 07/13/20 1138  WBC 7.1  HGB 6.5*  HCT 20.4*  MCV 82.6  PLT 200    Basic Metabolic Panel: Recent Labs  Lab 07/13/20 1138  NA 110*  K 5.4*  CL 84*  CO2 21*  GLUCOSE 83  BUN 5*  CREATININE <0.30*  CALCIUM 6.4*   GFR: CrCl cannot be calculated (This lab value cannot be used to calculate CrCl because it is not a number: <0.30). Recent Labs  Lab 07/13/20 1138  WBC 7.1    Liver Function Tests: Recent Labs  Lab 07/13/20 1138  AST 49*  ALT 16  ALKPHOS 55  BILITOT 1.2  PROT 3.2*  ALBUMIN 1.7*   No results for input(s): LIPASE, AMYLASE in the last 168 hours. No results for input(s): AMMONIA in the last 168 hours.  ABG No results found for: PHART, PCO2ART, PO2ART, HCO3, TCO2, ACIDBASEDEF, O2SAT   Coagulation Profile: No results for input(s): INR, PROTIME in the  last 168 hours.  Cardiac Enzymes: No results for input(s): CKTOTAL, CKMB, CKMBINDEX, TROPONINI in the last 168 hours.  HbA1C: No results found for: HGBA1C  CBG: Recent Labs  Lab 07/13/20 1127  GLUCAP 104*    Review of Systems:   Unable to review.   Past Medical History:  She,  has no past medical history on file.   Surgical History:  History reviewed. No pertinent surgical history.   Social History:      Family History:  Her  family history is not on file.   Allergies Allergies  Allergen Reactions  . Aricept [Donepezil Hcl]     Per MAR  . Sulfa Antibiotics     Per MAR      Home Medications  Prior to Admission medications   Medication Sig Start Date End Date Taking? Authorizing Provider  acetaminophen (TYLENOL) 325 MG tablet Take 650 mg by mouth every 4 (four) hours as needed for mild pain.   Yes [provider]  amLODipine (NORVASC) 10 MG tablet Take 10 mg by mouth daily.   Yes [provider]  aspirin EC 81 MG tablet    Yes [provider]  atorvastatin (LIPITOR) 40 MG tablet Take 40 mg by mouth daily.   Yes [provider]  cloNIDine (CATAPRES) 0.1 MG tablet Take 0.1 mg by mouth 2 (two) times daily.   Yes [provider]  feeding supplement, GLUCERNA SHAKE, (GLUCERNA SHAKE) LIQD Take 237 mLs by mouth 2 (two) times daily between meals.   Yes [provider]  insulin aspart (NOVOLOG FLEXPEN) 100 UNIT/ML FlexPen Inject 0-10 Units into the skin See admin instructions. Per sliding scale 3 times daily with meals  0-150=0 units 151-200=2 units 201-250=4 units 251-300=6 units 301-350=8 units 351-400= 10 units   Yes [provider]  insulin glargine (LANTUS) 100 UNIT/ML injection Inject 10 Units into the skin daily.   Yes [provider]  levothyroxine (SYNTHROID) 25 MCG tablet Take 25 mcg by mouth daily before breakfast.   Yes [provider]  lisinopril (ZESTRIL) 40 MG tablet Take 80 mg by mouth daily.   Yes [provider]  memantine (NAMENDA) 10 MG tablet Take 10 mg by mouth daily.   Yes [provider]  methocarbamol (ROBAXIN) 500 MG tablet Take 500 mg by mouth every 6 (six) hours as needed for muscle spasms.   Yes [provider]  sodium chloride 1 g tablet Take 1 g by mouth 3 (three) times daily.   Yes [provider]  atenolol-chlorthalidone (TENORETIC) 50-25 MG tablet Take 1 tablet by  mouth daily. Patient not taking: No sig reported    [provider]  cefTRIAXone (ROCEPHIN) 1 g injection Inject 1 g into the muscle daily. For 7 days Patient not taking: No sig reported    [provider]  levofloxacin (LEVAQUIN) 750 MG tablet Take 750 mg by mouth daily. For 7 days Patient not taking: No sig reported    [provider]

## 2020-07-13 NOTE — ED Notes (Signed)
EDP notified of critical values per call from lab.

## 2020-07-13 NOTE — Progress Notes (Signed)
Patient transferred to [facility] with belongings.  Niece, Laruth Bouchard notified via phone of transfer, new unit phone number provided. It was explained that patient had to be moved d/t order for 3%NS solution.  She states understanding and has no questions at this time.

## 2020-07-13 NOTE — Progress Notes (Signed)
Serum sodium level is still 110 after 8 hours, s/p bolus of NS.  Urine osm is appropriately low.  I think now meets the criteria for 3 % saline.  Will start infusion at 30 ml per hour and continue to monitor sodium slowly   Cecille Aver

## 2020-07-13 NOTE — Progress Notes (Signed)
Date and time results received: 07/13/20 6:46 PM    Test: Sodium   Critical Value: 110  Name of Provider Notified: Expected value  - Chatterjee notified.    Orders Received? Or Actions Taken?: no actions taken, MD aware. - paged at [employer]

## 2020-07-13 NOTE — Progress Notes (Signed)
eLink Physician-Brief Progress Note Patient Name: BARBARITA HUTMACHER DOB: Jan 25, 1930 MRN: [id number]   Date of Service  07/13/2020  HPI/Events of Note  77 F history of hypertension, dyslipidemia, DM (HbA1C 6.8), hypothyroidism, recently admitted for spontaneous epidural hematoma requiring C spine decompression now returns for lethargy, bradycardia and hypotension. Hyponatremic at 110 given fluids.   eICU Interventions   Na remains at 110 after 8 hours and decision was made as per nephrology to start patient on 3%saline hence ICU transfer.  TSH pending. Cortisol 12.6  Correct other electrolytes as per protocol     Intervention Category Major Interventions: Infection - evaluation and management;Electrolyte abnormality - evaluation and management Evaluation Type: New Patient Evaluation  Darl Pikes 07/13/2020, 11:34 PM

## 2020-07-13 NOTE — Progress Notes (Signed)
Overnight floor coverage progress note  Nephrology recommending hypertonic saline for hyponatremia which can only be administered in the ICU.  I have spoken to Dr. Gaynell Face, PCCM service will see the patient.

## 2020-07-13 NOTE — ED Notes (Signed)
Family at bedside, EDP has been notified of critical lab.

## 2020-07-13 NOTE — ED Triage Notes (Addendum)
Pt BIB [facility] from [facility], staff reports bt difficult to rouse this morning and required sternal rub. Pt sinus brady on monitor with initial pressure of 92/40, increased to 110/52 with of NS, pt remains difficult to rouse. Hx of pneumonia, UTI; midline in place and indwelling catheter present. Pt answers questions, but A&O x3, disoriented to time (year).

## 2020-07-13 NOTE — ED Notes (Signed)
Called to give report, secretary states RN is in a room and will call back when available.

## 2020-07-13 NOTE — Progress Notes (Signed)
   07/13/20 2000  Assess: MEWS Score  Temp (!) 95.8 F (35.4 C)  BP 133/60  Pulse Rate (!) 56  ECG Heart Rate (!) 57  Resp 14  Level of Consciousness Responds to Voice  SpO2 97 %  O2 Device Room Air  Assess: MEWS Score  MEWS Temp 1  MEWS Systolic 0  MEWS Pulse 0  MEWS RR 0  MEWS LOC 1  MEWS Score 2  MEWS Score Color Yellow  Assess: if the MEWS score is Yellow or Red  Were vital signs taken at a resting state? Yes  Focused Assessment No change from prior assessment  Early Detection of Sepsis Score *See Row Information* Low  MEWS guidelines implemented *See Row Information* Yes  Treat  MEWS Interventions Escalated (See documentation below)  Pain Scale 0-10  Pain Score 0  Take Vital Signs  Increase Vital Sign Frequency  Yellow: Q 2hr X 2 then Q 4hr X 2, if remains yellow, continue Q 4hrs  Escalate  MEWS: Escalate Yellow: discuss with charge nurse/RN and consider discussing with provider and RRT  Notify: Charge Nurse/RN  Name of Charge Nurse/RN Notified Posey Boyer, RN  Date Charge Nurse/RN Notified 07/13/20  Time Charge Nurse/RN Notified 2000  Notify: Provider  Provider Name/Title Dr. Loney Loh  Date Provider Notified 07/13/20  Time Provider Notified 2008  Notification Type Page  Notification Reason Other (Comment) (temp remains low despite interventions on unit)  Provider response See new orders  Date of Provider Response 07/13/20  Time of Provider Response 2048  Document  Patient Outcome Not stable and remains on department  Progress note created (see row info) Yes

## 2020-07-13 NOTE — Consult Note (Signed)
Reason for Consult: Hyponatremia Referring Physician:  [name] is an 87 y.o. female with past medical history significant for type 2 diabetes mellitus, hypertension with regimen to include chlorthalidone, dementia but alert and able to recognize family members.  She resides at [facility], unknown how long.  Reported recent cervical spinal surgery but details are not known.  She is sent to the emergency department for altered mental status.  According to the family member, several days ago she was her usual self, alert and appreciative.  However, over the last couple of days she has become more somnolent and had not been eating or drinking much.  Family member states that staff had reported low sodium levels at [facility] health care.  Today in the emergency department, sodium noted to be 110, potassium of 5.4 (she was also noted to be on an ACE inhibitor as an outpatient)  ,  very low BUN and creatinine, albumin 1.7 and hemoglobin 6.5.  Urinalysis, urine osmolality as well as serum osmolality, TSH, and cortisol have been ordered but not resulted yet.  Initial blood pressure on the soft side- temp of 96.7.  She is currently receiving normal saline at 75 cc/h   Trend in Creatinine: Creatinine, Ser  Date/Time Value Ref Range Status  07/13/2020 11:38 AM <0.30 (L) 0.44 - 1.00 mg/dL Final   Sodium  Date/Time Value Ref Range Status  07/13/2020 11:38 AM 110 (LL) 135 - 145 mmol/L Final    Comment:    CRITICAL RESULT CALLED TO, READ BACK BY AND VERIFIED WITH: K. PLANTAR,RN 07/13/2020 1231 DAVISB    PMH:  History reviewed. No pertinent past medical history.  PSH:  History reviewed. No pertinent surgical history.  Allergies:  Allergies  Allergen Reactions  . Aricept [Donepezil Hcl]     Per MAR  . Sulfa Antibiotics     Per MAR     Medications:   Prior to Admission medications   Medication Sig Start Date End Date Taking? Authorizing Provider  acetaminophen (TYLENOL) 325  MG tablet Take 650 mg by mouth every 4 (four) hours as needed for mild pain.   Yes [provider]  amLODipine (NORVASC) 10 MG tablet Take 10 mg by mouth daily.   Yes [provider]  aspirin EC 81 MG tablet    Yes [provider]  atorvastatin (LIPITOR) 40 MG tablet Take 40 mg by mouth daily.   Yes [provider]  cloNIDine (CATAPRES) 0.1 MG tablet Take 0.1 mg by mouth 2 (two) times daily.   Yes [provider]  feeding supplement, GLUCERNA SHAKE, (GLUCERNA SHAKE) LIQD Take 237 mLs by mouth 2 (two) times daily between meals.   Yes [provider]  insulin aspart (NOVOLOG FLEXPEN) 100 UNIT/ML FlexPen Inject 0-10 Units into the skin See admin instructions. Per sliding scale 3 times daily with meals  0-150=0 units 151-200=2 units 201-250=4 units 251-300=6 units 301-350=8 units 351-400= 10 units   Yes [provider]  insulin glargine (LANTUS) 100 UNIT/ML injection Inject 10 Units into the skin daily.   Yes [provider]  levothyroxine (SYNTHROID) 25 MCG tablet Take 25 mcg by mouth daily before breakfast.   Yes [provider]  lisinopril (ZESTRIL) 40 MG tablet Take 80 mg by mouth daily.   Yes [provider]  memantine (NAMENDA) 10 MG tablet Take 10 mg by mouth daily.   Yes [provider]  methocarbamol (ROBAXIN) 500 MG tablet Take 500 mg by mouth  every 6 (six) hours as needed for muscle spasms.   Yes [provider]  sodium chloride 1 g tablet Take 1 g by mouth 3 (three) times daily.   Yes [provider]  atenolol-chlorthalidone (TENORETIC) 50-25 MG tablet Take 1 tablet by mouth daily. Patient not taking: No sig reported    [provider]  cefTRIAXone (ROCEPHIN) 1 g injection Inject 1 g into the muscle daily. For 7 days Patient not taking: No sig reported    [provider]  levofloxacin (LEVAQUIN) 750 MG tablet Take 750 mg by mouth daily. For 7  days Patient not taking: No sig reported    [provider]    Discontinued Meds:   Medications Discontinued During This Encounter  Medication Reason  . potassium chloride SA (KLOR-CON) 20 MEQ tablet Error  . 0.9 %  sodium chloride infusion     Social History:  has no history on file for tobacco use, alcohol use, and drug use.  Family History:  History reviewed. No pertinent family history.  Review of systems not obtained due to patient factors.  Blood pressure (!) 126/50, pulse (!) 58, temperature (!) 96.4 F (35.8 C), temperature source Axillary, resp. rate 19, height 5\' 5"  (1.651 m), weight 57.2 kg, SpO2 100 %. General appearance: appears stated age, fatigued, no distress and slowed mentation Resp: clear to auscultation bilaterally Cardio: bradycardic but regular GI: soft, non-tender; bowel sounds normal; no masses,  no organomegaly Extremities: edema 1-2 + to dep areas Skin: Skin color, texture, turgor normal. No rashes or lesions Neurologic: Mental status: alertness: lethargic  Labs: Basic Metabolic Panel: Recent Labs  Lab 07/13/20 1138  NA 110*  K 5.4*  CL 84*  CO2 21*  GLUCOSE 83  BUN 5*  CREATININE <0.30*  ALBUMIN 1.7*  CALCIUM 6.4*   Liver Function Tests: Recent Labs  Lab 07/13/20 1138  AST 49*  ALT 16  ALKPHOS 55  BILITOT 1.2  PROT 3.2*  ALBUMIN 1.7*   No results for input(s): LIPASE, AMYLASE in the last 168 hours. No results for input(s): AMMONIA in the last 168 hours. CBC: Recent Labs  Lab 07/13/20 1138 07/13/20 1700  WBC 7.1 7.9  HGB 6.5* 7.6*  HCT 20.4* 22.6*  MCV 82.6 77.9*  PLT 200 238   PT/INR: @labrcntip (inr:5) Cardiac Enzymes: No results for input(s): CKTOTAL, CKMB, CKMBINDEX, TROPONINI in the last 168 hours. CBG: Recent Labs  Lab 07/13/20 1127  GLUCAP 104*    Iron Studies: No results for input(s): IRON, TIBC, TRANSFERRIN, FERRITIN in the last 168 hours.  Xrays/Other Studies: No results  found.   Assessment/Plan: 87 year old black female who is new to this medical system so we do not really have a lot of data.  Reportedly, sodium was low as outpatient but we do not know how long.  Now presents with altered mental status and sodium of 110  1) hyponatremia-significant with a sodium of 110.  Do not know baseline or where it had been recently.  Certainly the chlorthalidone could have been contributing and that has been discontinued.  Patient may have had some decreased p.o. intake (tea and toast).  She actually has some lower extremity edema, probably third spacing secondary to decreased albumin so is a very confusing picture.  I think is reasonable right now to give her normal saline which is hypertonic to where she is.  Other labs to include TSH, cortisol, urine osmolality and urine sodium are pending at this time.  These will help to  guide our therapy.  I agree with every 3 hour sodiums to make sure that we are not correcting too quickly or to tell us if we need to be more aggressive.  There are no other medications on her med list that are suspicious for contribution 2) hyperkalemia- hold lisinopril and follow.  Could indicate a hypoaldosterone state 3) hypocalcemia-actually corrected calcium is okay.  Has received some repletion 4) anemia- significant in nature, could be some due to recent hospitalization.  Not sure if transfusion is being considered at this time 5) patient is DNR   Cecille Aver 07/13/2020, 6:11 PM

## 2020-07-14 ENCOUNTER — Inpatient Hospital Stay (HOSPITAL_COMMUNITY): Payer: Medicare PPO

## 2020-07-14 DIAGNOSIS — E871 Hypo-osmolality and hyponatremia: Secondary | ICD-10-CM | POA: Diagnosis not present

## 2020-07-14 DIAGNOSIS — E440 Moderate protein-calorie malnutrition: Secondary | ICD-10-CM | POA: Insufficient documentation

## 2020-07-14 DIAGNOSIS — R609 Edema, unspecified: Secondary | ICD-10-CM | POA: Diagnosis not present

## 2020-07-14 DIAGNOSIS — D649 Anemia, unspecified: Secondary | ICD-10-CM | POA: Diagnosis not present

## 2020-07-14 DIAGNOSIS — R4182 Altered mental status, unspecified: Secondary | ICD-10-CM | POA: Diagnosis not present

## 2020-07-14 LAB — BASIC METABOLIC PANEL
Anion gap: 10 (ref 5–15)
Anion gap: 7 (ref 5–15)
Anion gap: 5 (ref 5–15)
BUN: 5 mg/dL — ABNORMAL LOW (ref 8–23)
BUN: <5 mg/dL — ABNORMAL LOW (ref 8–23)
BUN: <5 mg/dL — ABNORMAL LOW (ref 8–23)
CO2: 20 mmol/L — ABNORMAL LOW (ref 22–32)
CO2: 26 mmol/L (ref 22–32)
CO2: 27 mmol/L (ref 22–32)
Calcium: 7.4 mg/dL — ABNORMAL LOW (ref 8.9–10.3)
Calcium: 7.4 mg/dL — ABNORMAL LOW (ref 8.9–10.3)
Calcium: 7.5 mg/dL — ABNORMAL LOW (ref 8.9–10.3)
Chloride: 80 mmol/L — ABNORMAL LOW (ref 98–111)
Chloride: 80 mmol/L — ABNORMAL LOW (ref 98–111)
Chloride: 82 mmol/L — ABNORMAL LOW (ref 98–111)
Creatinine, Ser: <0.3 mg/dL — ABNORMAL LOW (ref 0.44–1.00)
Creatinine, Ser: <0.3 mg/dL — ABNORMAL LOW (ref 0.44–1.00)
Creatinine, Ser: <0.3 mg/dL — ABNORMAL LOW (ref 0.44–1.00)
Glucose, Bld: 71 mg/dL (ref 70–99)
Glucose, Bld: 67 mg/dL — ABNORMAL LOW (ref 70–99)
Glucose, Bld: 74 mg/dL (ref 70–99)
Potassium: 4.5 mmol/L (ref 3.5–5.1)
Potassium: 3 mmol/L — ABNORMAL LOW (ref 3.5–5.1)
Potassium: 3 mmol/L — ABNORMAL LOW (ref 3.5–5.1)
Sodium: 110 mmol/L — CL (ref 135–145)
Sodium: 113 mmol/L — CL (ref 135–145)
Sodium: 114 mmol/L — CL (ref 135–145)

## 2020-07-14 LAB — CBC
HCT: 23.4 % — ABNORMAL LOW (ref 36.0–46.0)
Hemoglobin: 7.9 g/dL — ABNORMAL LOW (ref 12.0–15.0)
MCH: 26.3 pg (ref 26.0–34.0)
MCHC: 33.8 g/dL (ref 30.0–36.0)
MCV: 78 fL — ABNORMAL LOW (ref 80.0–100.0)
Platelets: 243 10*3/uL (ref 150–400)
RBC: 3 MIL/uL — ABNORMAL LOW (ref 3.87–5.11)
RDW: 14 % (ref 11.5–15.5)
WBC: 6.4 10*3/uL (ref 4.0–10.5)
nRBC: 0 % (ref 0.0–0.2)

## 2020-07-14 LAB — GLUCOSE, CAPILLARY
Glucose-Capillary: 151 mg/dL — ABNORMAL HIGH (ref 70–99)
Glucose-Capillary: 152 mg/dL — ABNORMAL HIGH (ref 70–99)
Glucose-Capillary: 231 mg/dL — ABNORMAL HIGH (ref 70–99)
Glucose-Capillary: 73 mg/dL (ref 70–99)
Glucose-Capillary: 77 mg/dL (ref 70–99)
Glucose-Capillary: 79 mg/dL (ref 70–99)

## 2020-07-14 LAB — PHOSPHORUS
Phosphorus: 2.6 mg/dL (ref 2.5–4.6)
Phosphorus: 2.4 mg/dL — ABNORMAL LOW (ref 2.5–4.6)

## 2020-07-14 LAB — MAGNESIUM
Magnesium: 1.2 mg/dL — ABNORMAL LOW (ref 1.7–2.4)
Magnesium: 2.1 mg/dL (ref 1.7–2.4)

## 2020-07-14 LAB — SODIUM
Sodium: 116 mmol/L — CL (ref 135–145)
Sodium: 115 mmol/L — CL (ref 135–145)

## 2020-07-14 LAB — OSMOLALITY: Osmolality: 223 mOsm/kg — CL (ref 275–295)

## 2020-07-14 LAB — MRSA PCR SCREENING: MRSA by PCR: NEGATIVE

## 2020-07-14 LAB — TSH: TSH: 2.255 u[IU]/mL (ref 0.350–4.500)

## 2020-07-14 MED ORDER — ORAL CARE MOUTH RINSE
15.0000 mL | Freq: Two times a day (BID) | OROMUCOSAL | Status: DC
  Administered 2020-07-14: 15 mL via OROMUCOSAL

## 2020-07-14 MED ORDER — MAGNESIUM SULFATE 4 GM/100ML IV SOLN
4.0000 g | Freq: Once | INTRAVENOUS | Status: AC
  Administered 2020-07-14: 4 g via INTRAVENOUS
  Filled 2020-07-14: qty 100

## 2020-07-14 MED ORDER — ORAL CARE MOUTH RINSE
15.0000 mL | Freq: Two times a day (BID) | OROMUCOSAL | Status: DC
  Administered 2020-07-15 – 2020-08-06 (×31): 15 mL via OROMUCOSAL

## 2020-07-14 MED ORDER — SODIUM CHLORIDE 0.9 % IV SOLN: INTRAVENOUS | Status: DC | PRN

## 2020-07-14 MED ORDER — CLONIDINE HCL 0.1 MG PO TABS: 0.1000 mg | ORAL_TABLET | Freq: Two times a day (BID) | ORAL | Status: DC

## 2020-07-14 MED ORDER — POTASSIUM CHLORIDE 20 MEQ PO PACK
40.0000 meq | PACK | Freq: Two times a day (BID) | ORAL | Status: AC
  Administered 2020-07-14 (×2): 40 meq
  Filled 2020-07-14 (×2): qty 2

## 2020-07-14 MED ORDER — OSMOLITE 1.2 CAL PO LIQD
1000.0000 mL | ORAL | Status: DC
  Administered 2020-07-14: 1000 mL
  Filled 2020-07-14 (×9): qty 1000

## 2020-07-14 MED ORDER — CHLORHEXIDINE GLUCONATE 0.12 % MT SOLN
15.0000 mL | Freq: Two times a day (BID) | OROMUCOSAL | Status: DC
  Administered 2020-07-14 – 2020-08-06 (×43): 15 mL via OROMUCOSAL
  Filled 2020-07-14 (×38): qty 15

## 2020-07-14 NOTE — Progress Notes (Signed)
eLink Physician-Brief Progress Note Patient Name: Suzanne Fox DOB: 11-20-1929 MRN: [id number]   Date of Service  07/14/2020  HPI/Events of Note  Hyponatremia - Na+ =110 --> 114 --> 116 --> 115. Progression is over 24 hours.  eICU Interventions  Continue current 3% Saline infusion rate.      Intervention Category Major Interventions: Electrolyte abnormality - evaluation and management  Nakyah Erdmann Dennard Nip 07/14/2020, 11:31 PM

## 2020-07-14 NOTE — Progress Notes (Signed)
NAME:  Suzanne Fox, MRN:  [id number], DOB:  02-Nov-1929, LOS: 1 ADMISSION DATE:  07/13/2020, CONSULTATION DATE:  07/13/20 REFERRING MD: Kathrene Bongo , CHIEF COMPLAINT:  Fatigue, hyponatremia  History of Present Illness:  87yo female admitted for hyponatremia requiring hypertonic saline Question poor oral intake along with continuation of chlorthalidone as cause   Pertinent  Medical History  3/17-3/25 with spontaneous epidural hematoma requiring C spine decompression and fusion.  Tobacco abuse PAD Hypothyroidism Hypertension Hyperlipidemia Diabetes type 2 Dementia Arthritis,  Recent hx of PNA and UTI  Significant Hospital Events: Including procedures, antibiotic start and stop dates in addition to other pertinent events   . 4/19 admitted for hyponatremia, hypertonic saline started    Interim History / Subjective:  No acute events overnight  Lab having difficulty with obtaining labs draws   Objective   Blood pressure (!) 113/42, pulse (!) 47, temperature (!) 97.2 F (36.2 C), temperature source Oral, resp. rate 12, height 5\' 5"  (1.651 m), weight 57.2 kg, SpO2 97 %.        Intake/Output Summary (Last 24 hours) at 07/14/2020 07/16/2020 Last data filed at 07/14/2020 0600 Gross per 24 hour  Intake 351.12 ml  Output 775 ml  Net -423.88 ml   Filed Weights   07/13/20 1726  Weight: 57.2 kg    Examination: General: Chronically ill appearing frail elderly female lying in bed in NAD HEENT: [place]/AT, MM pink/moist, PERRL,  Neuro: Alert and oriented x2, underlying confusion, lethargic  CV: s1s2 regular rate and rhythm, no murmur, rubs, or gallops,  PULM:  Clear to ascultation bilaterally, no added breath sounds, no increased work of breathing  GI: soft, bowel sounds active in all 4 quadrants, non-tender, non-distended Extremities: warm/dry, generalized edema  Skin: no rashes or lesions  Labs/imaging that I havepersonally reviewed    4/19 > CXR: B pleural effusions  4/20 > Bmet: Na  110, Cl 80, Osmolarity 223,    Resolved Hospital Problem list     Assessment & Plan:  Hyponatremia  -Likely 2/2 poor solute intake, chlorthalidone.   -consider SIADH, consider hyperaldosterone -With difficulty of lab draws discussed with nursing plan to use Midline for further draws  P: Continue hypertonic saline  Nephrology consulted, appreciate assistance  Frequent Bmets  Hold home Chlorthalidone  Maintain neuro protective measures Nutrition and bowel regiment  Seizure precautions  Aspirations precautions   Hypokalemia P: Supplement as needed  UTI -Mod leukocytosis.  Hospitalist team started CTX due to hypothermia and UA findings.  Lactate and Procal pending.   Anemia  P: Trend cbc   Transfuse per protocol  Hgb goal greater than 7  Hypothyroidism -Home medications includes Synthroid  P: Continue home med  Hypertension -Home medication include Norvasc, Clonidine, lisinopril  Hyperlipidemia -Home meds Lipitor  P: Continuous telemetry  IV PRN antihypertensives  Hold oral meds until mentation improves   Diabetes type 2 -Home meds include Lantus and and SSI P: CBG borderline low in the setting of NPO  CBG q4 SSI  Hold long acting insuline while PO intake is poor   Dementia -Home med include Namenda  P: Resume home med when safe   At risk malnutrition  Failure to thrive  P: Place small bore feeding tube today  Start TF once in place  Supplement protein   Best practice   Diet:  NPO  Place Coretrack and start TF Pain/Anxiety/Delirium protocol (if indicated): No VAP protocol (if indicated): Not indicated DVT prophylaxis: Subcutaneous Heparin GI prophylaxis: N/A Glucose control:  SSI Yes Central venous access:  N/A Arterial line:  N/A Foley:  Yes, and it is still needed Mobility:  bed rest  PT consulted: N/A Last date of multidisciplinary goals of care discussion: Update family daily  Code Status:  DNR Disposition: ICU    Critical care  time:   CRITICAL CARE Performed by: Delfin Gant  Total critical care time: 32 minutes  Critical care time was exclusive of separately billable procedures and treating other patients.  Critical care was necessary to treat or prevent imminent or life-threatening deterioration.  Critical care was time spent personally by me on the following activities: development of treatment plan with patient and/or surrogate as well as nursing, discussions with consultants, evaluation of patient's response to treatment, examination of patient, obtaining history from patient or surrogate, ordering and performing treatments and interventions, ordering and review of laboratory studies, ordering and review of radiographic studies, pulse oximetry and re-evaluation of patient's condition.  Delfin Gant, NP-C [facility] Personal contact information can be found on Amion  If no response please page: Adult pulmonary and critical care medicine pager on Amion unitl 7pm After 7pm please call [phone] 07/14/2020, 8:50 AM

## 2020-07-14 NOTE — Procedures (Signed)
Cortrak  Person Inserting Tube:  Kendell Bane C, RD Tube Type:  Cortrak - 43 inches Tube Location:  Right nare Initial Placement:  Stomach Secured by: Bridle Technique Used to Measure Tube Placement:  Documented cm marking at nare/ corner of mouth Cortrak Secured At:  60 cm    Cortrak Tube Team Note:  Consult received to place a Cortrak feeding tube.   X-ray is required, abdominal x-ray has been ordered by the Cortrak team. Please confirm tube placement before using the Cortrak tube.   If the tube becomes dislodged please keep the tube and contact the Cortrak team at [email or web address] (password [email or web address]) for replacement.  If after hours and replacement cannot be delayed, place a NG tube and confirm placement with an abdominal x-ray.    Cammy Copa., RD, LDN, CNSC See AMiON for contact information

## 2020-07-14 NOTE — Plan of Care (Signed)
?  Problem: Clinical Measurements: ?Goal: Will remain free from infection ?Outcome: Progressing ?  ?

## 2020-07-14 NOTE — Progress Notes (Signed)
Right upper extremity venous duplex has been completed. Preliminary results can be found in CV Proc through chart review.   07/14/20 11:51 AM Olen Cordial RVT

## 2020-07-14 NOTE — Progress Notes (Addendum)
Initial Nutrition Assessment  DOCUMENTATION CODES:   Non-severe (moderate) malnutrition in context of chronic illness  INTERVENTION:   Initiate tube feeds via Cortrak: - Start Osmolite 1.2 at 25 ml/hr and advance by 10 ml q 8 hours to goal rate of 55 ml/hr (1320 ml/day)  Tube feeding regimen at goal provides 1584 kcal, 73 grams of protein, and 1082 ml of H2O.   Monitor magnesium, potassium, and phosphorus daily for at least 3 days, MD to replete as needed, as pt is at risk for refeeding syndrome given malnutrition.  NUTRITION DIAGNOSIS:   Moderate Malnutrition related to chronic illness (dementia) as evidenced by moderate fat depletion,severe muscle depletion.  GOAL:   Patient will meet greater than or equal to 90% of their needs  MONITOR:   Diet advancement,Labs,Weight trends,TF tolerance,I & O's  REASON FOR ASSESSMENT:   Consult Enteral/tube feeding initiation and management  ASSESSMENT:   87 year old female who presented to the ED on 4/19 from [facility] with AMS and hypotension. PMH of dementia, HTN, T2DM, and recent cervical surgery for hematoma. Pt admitted with hyponatremia in the setting of suspected dehydration/poor nutrition and use of chlorthalidone as well as acute metabolic encephalopathy.   4/19 - transferred to ICU, started on hypertonic saline 4/20 - Cortrak placed, tip at the pylorus per abdominal x-ray  Consult received for tube feeding initiation and management. Cortrak placed today with tube tip at pylorus per abdominal x-ray. Pt is NPO.  Plan is to continue hypertonic saline. Nephrology following.  Unable to obtain diet and weight history at this time. No weight history available in chart. Per review of notes, pt likely with poor PO intake PTA. Duration of poor PO intake is unclear.  Pt meets criteria for severe malnutrition. Will start tube feeds at trickle rate and advance slowly. Monitor for refeeding.  Medications reviewed and  include: SSI q 4 hours, IV abx, IV magnesium sulfate 4 grams once, hypertonic saline @ 30 ml/hr  Labs reviewed: sodium 113, potassium 3.0, magnesium 1.2, hemoglobin 7.9 CBG's: 73-104 x 24 hours  UOP: 775 ml x 12 hours I/O's: -423 ml since admit  NUTRITION - FOCUSED PHYSICAL EXAM:  Flowsheet Row Most Recent Value  Orbital Region Mild depletion  Upper Arm Region Moderate depletion  Thoracic and Lumbar Region Moderate depletion  Buccal Region Moderate depletion  Temple Region Severe depletion  Clavicle Bone Region Severe depletion  Clavicle and Acromion Bone Region Severe depletion  Scapular Bone Region Severe depletion  Dorsal Hand Unable to assess  [edematous]  Patellar Region Severe depletion  Anterior Thigh Region Severe depletion  Posterior Calf Region Severe depletion  Edema (RD Assessment) Moderate  [hands, BUE]  Hair Reviewed  Eyes Reviewed  Mouth Reviewed  Skin Reviewed  Nails Reviewed     NFPE completed on 4/20 by Kendell Bane, RD.   Diet Order:   Diet Order            Diet NPO time specified Except for: Sips with Meds, Ice Chips  Diet effective now                 EDUCATION NEEDS:   Not appropriate for education at this time  Skin:  Skin Assessment: Reviewed RN Assessment  Last BM:  07/13/20 small type 6  Height:   Ht Readings from Last 1 Encounters:  07/13/20 5\' 5"  (1.651 m)    Weight:   Wt Readings from Last 1 Encounters:  07/13/20 57.2 kg    Ideal Body Weight:  56.8 kg  BMI:  Body mass index is 20.97 kg/m.  Estimated Nutritional Needs:   Kcal:  1400-1600  Protein:  65-80 grams  Fluid:  1.4-1.6 L    Mertie Clause, MS, RD, LDN Inpatient Clinical Dietitian Please see AMiON for contact information.

## 2020-07-14 NOTE — Consult Note (Signed)
[facility] Consult Note     Date: 07/14/2020                  Patient Name:  Suzanne Fox  MRN: [id number]  DOB: 22-Apr-1929  Age / Sex: 87 y.o., female         PCP: Georgianne Fick, MD                 Service Requesting Consult: Hospitalist                 Reason for Consult: Hyponatremia            Chief Complaint: Altered mental status HPI: Suzanne Fox is an 9 yof with PMH significant for T2 diabetes mellitus, hypertension, mild dementia, and s/p cervical spine surgery 6 wks ago who has been admitted for significant hyponatremia. Nephrology has been consulted for management assistance.  Chart review: Suzanne Fox presented to the ED on 4/19 from a rehab facility c/o AMS and hypotension. Per notes, patient's niece was present at the time and reported patient is normally alert and interactive but has been progressively less alert, weaker, and eating less over the previous 2 days. In the ED, patient was found to have significant hyponatremia at 110 as well as bradycardia and bilateral LE edema. NS infusion was initiated at 10 mL/hr. BUN and creatinine were low, albumin 1.7, and hemoglobin 6.5. The patient had a rectal temp of 95.8 F, prompting concern for infection, so ceftriaxone was initiated. Labs showed no leukocytosis, lactate of 0.8 and procalcitonin < 0.10; UA with moderate leukocytes, 21-50 WBCs, and rare bacteria; urine & blood cultures pending.   Serum Na remained 110 after a total of 500 mL of NS was infused, so 3% saline was initiated at 30 mL/hr and patient transferred to ICU. Sodium has been correcting at a good pace (3 mmol/L in 8 hours) since then.   History reviewed. No pertinent family history. Social History:  has no history on file for tobacco use, alcohol use, and drug use.  Allergies:  Allergies  Allergen Reactions  . Aricept [Donepezil Hcl]     Per MAR  . Sulfa Antibiotics     Per MAR     Medications Prior to Admission  Medication Sig  Dispense Refill  . acetaminophen (TYLENOL) 325 MG tablet Take 650 mg by mouth every 4 (four) hours as needed for mild pain.    Marland Kitchen amLODipine (NORVASC) 10 MG tablet Take 10 mg by mouth daily.    Marland Kitchen aspirin EC 81 MG tablet     . atorvastatin (LIPITOR) 40 MG tablet Take 40 mg by mouth daily.    . cloNIDine (CATAPRES) 0.1 MG tablet Take 0.1 mg by mouth 2 (two) times daily.    . feeding supplement, GLUCERNA SHAKE, (GLUCERNA SHAKE) LIQD Take 237 mLs by mouth 2 (two) times daily between meals.    . insulin aspart (NOVOLOG FLEXPEN) 100 UNIT/ML FlexPen Inject 0-10 Units into the skin See admin instructions. Per sliding scale 3 times daily with meals  0-150=0 units 151-200=2 units 201-250=4 units 251-300=6 units 301-350=8 units 351-400= 10 units    . insulin glargine (LANTUS) 100 UNIT/ML injection Inject 10 Units into the skin daily.    Marland Kitchen levothyroxine (SYNTHROID) 25 MCG tablet Take 25 mcg by mouth daily before breakfast.    . lisinopril (ZESTRIL) 40 MG tablet Take 80 mg by mouth daily.    . memantine (NAMENDA) 10 MG tablet Take 10 mg by mouth  daily.    . methocarbamol (ROBAXIN) 500 MG tablet Take 500 mg by mouth every 6 (six) hours as needed for muscle spasms.    . sodium chloride 1 g tablet Take 1 g by mouth 3 (three) times daily.    Marland Kitchen atenolol-chlorthalidone (TENORETIC) 50-25 MG tablet Take 1 tablet by mouth daily. (Patient not taking: No sig reported)    . cefTRIAXone (ROCEPHIN) 1 g injection Inject 1 g into the muscle daily. For 7 days (Patient not taking: No sig reported)    . levofloxacin (LEVAQUIN) 750 MG tablet Take 750 mg by mouth daily. For 7 days (Patient not taking: No sig reported)      Results for orders placed or performed during the hospital encounter of 07/13/20 (from the past 48 hour(s))  CBG monitoring, ED     Status: Abnormal   Collection Time: 07/13/20 11:27 AM  Result Value Ref Range   Glucose-Capillary 104 (H) 70 - 99 mg/dL    Comment: Glucose reference range applies only  to samples taken after fasting for at least 8 hours.   Comment 1 Notify RN    Comment 2 Document in Chart   Comprehensive metabolic panel     Status: Abnormal   Collection Time: 07/13/20 11:38 AM  Result Value Ref Range   Sodium 110 (LL) 135 - 145 mmol/L    Comment: CRITICAL RESULT CALLED TO, READ BACK BY AND VERIFIED WITH: K. PLANTAR,RN 07/13/2020 1231 DAVISB    Potassium 5.4 (H) 3.5 - 5.1 mmol/L    Comment: SPECIMEN HEMOLYZED. HEMOLYSIS MAY AFFECT INTEGRITY OF RESULTS.   Chloride 84 (L) 98 - 111 mmol/L   CO2 21 (L) 22 - 32 mmol/L   Glucose, Bld 83 70 - 99 mg/dL    Comment: Glucose reference range applies only to samples taken after fasting for at least 8 hours.   BUN 5 (L) 8 - 23 mg/dL   Creatinine, Ser <6.57 (L) 0.44 - 1.00 mg/dL   Calcium 6.4 (LL) 8.9 - 10.3 mg/dL    Comment: CRITICAL RESULT CALLED TO, READ BACK BY AND VERIFIED WITH: K.PLANTAR,RN 07/13/2020 1231 DAVISB    Total Protein 3.2 (L) 6.5 - 8.1 g/dL   Albumin 1.7 (L) 3.5 - 5.0 g/dL   AST 49 (H) 15 - 41 U/L   ALT 16 0 - 44 U/L   Alkaline Phosphatase 55 38 - 126 U/L   Total Bilirubin 1.2 0.3 - 1.2 mg/dL   GFR, Estimated NOT CALCULATED >60 mL/min    Comment: (NOTE) Calculated using the CKD-EPI Creatinine Equation (2021)    Anion gap 5 5 - 15    Comment: Performed at [facility], [address], [place], [place] [address]  CBC     Status: Abnormal   Collection Time: 07/13/20 11:38 AM  Result Value Ref Range   WBC 7.1 4.0 - 10.5 K/uL   RBC 2.47 (L) 3.87 - 5.11 MIL/uL   Hemoglobin 6.5 (LL) 12.0 - 15.0 g/dL    Comment: REPEATED TO VERIFY THIS CRITICAL RESULT HAS VERIFIED AND BEEN CALLED TO PAYDEN BLANCHARD,RN BY ZELDA BEECH ON 04 19 2022 AT 1156, AND HAS BEEN READ BACK.     HCT 20.4 (L) 36.0 - 46.0 %   MCV 82.6 80.0 - 100.0 fL   MCH 26.3 26.0 - 34.0 pg   MCHC 31.9 30.0 - 36.0 g/dL   RDW 29.5 28.4 - 13.2 %   Platelets 200 150 - 400 K/uL   nRBC 0.0 0.0 -  0.2 %    Comment: Performed at [facility]  Lab, [address], [place], [place] [address]  SARS CORONAVIRUS 2 (TAT 6-24 HRS) Nasopharyngeal Nasopharyngeal Swab     Status: None   Collection Time: 07/13/20  3:13 PM   Specimen: Nasopharyngeal Swab  Result Value Ref Range   SARS Coronavirus 2 NEGATIVE NEGATIVE    Comment: (NOTE) SARS-CoV-2 target nucleic acids are NOT DETECTED.  The SARS-CoV-2 RNA is generally detectable in upper and lower respiratory specimens during the acute phase of infection. Negative results do not preclude SARS-CoV-2 infection, do not rule out co-infections with other pathogens, and should not be used as the sole basis for treatment or other patient management decisions. Negative results must be combined with clinical observations, patient history, and epidemiological information. The expected result is Negative.  Fact Sheet for Patients: HairSlick.no  Fact Sheet for Healthcare Providers: quierodirigir.com  This test is not yet approved or cleared by the [place] FDA and  has been authorized for detection and/or diagnosis of SARS-CoV-2 by FDA under an Emergency Use Authorization (EUA). This EUA will remain  in effect (meaning this test can be used) for the duration of the COVID-19 declaration under Se ction 564(b)(1) of the Act, 21 U.S.C. section 360bbb-3(b)(1), unless the authorization is terminated or revoked sooner.  Performed at [facility], [address], [place], [place] [address]   POC occult blood, ED Provider will collect     Status: Abnormal   Collection Time: 07/13/20  3:59 PM  Result Value Ref Range   Fecal Occult Bld POSITIVE (A) NEGATIVE  Urinalysis, Routine w reflex microscopic Nasopharyngeal Swab     Status: Abnormal   Collection Time: 07/13/20  4:47 PM  Result Value Ref Range   Color, Urine STRAW (A) YELLOW   APPearance HAZY (A) CLEAR   Specific Gravity, Urine 1.005 1.005 - 1.030   pH 7.0 5.0 - 8.0   Glucose, UA  NEGATIVE NEGATIVE mg/dL   Hgb urine dipstick SMALL (A) NEGATIVE   Bilirubin Urine NEGATIVE NEGATIVE   Ketones, ur NEGATIVE NEGATIVE mg/dL   Protein, ur NEGATIVE NEGATIVE mg/dL   Nitrite NEGATIVE NEGATIVE   Leukocytes,Ua MODERATE (A) NEGATIVE   RBC / HPF 6-10 0 - 5 RBC/hpf   WBC, UA 21-50 0 - 5 WBC/hpf   Bacteria, UA RARE (A) NONE SEEN   Squamous Epithelial / LPF 11-20 0 - 5   Mucus PRESENT    Non Squamous Epithelial 0-5 (A) NONE SEEN    Comment: Performed at [facility], [address], [place], [place] [address]  Sodium, urine, random     Status: None   Collection Time: 07/13/20  4:47 PM  Result Value Ref Range   Sodium, Ur 98 mmol/L    Comment: Performed at [facility] Lab, [address] [place], [place] [address]  Osmolality, urine     Status: Abnormal   Collection Time: 07/13/20  4:47 PM  Result Value Ref Range   Osmolality, Ur 269 (L) 300 - 900 mOsm/kg    Comment: Performed at [facility] Lab, [address] [place], [place] [address]  Sodium     Status: Abnormal   Collection Time: 07/13/20  5:00 PM  Result Value Ref Range   Sodium 110 (LL) 135 - 145 mmol/L    Comment: CRITICAL RESULT CALLED TO, READ BACK BY AND VERIFIED WITHLilian Coma RN 1308 [phone] Marveen Reeks Performed at [facility] Lab, [address] [place], [place] [address]   Magnesium     Status: Abnormal   Collection Time: 07/13/20  5:00 PM  Result Value Ref Range   Magnesium 1.2 (L) 1.7 - 2.4 mg/dL    Comment: Performed at [facility] Lab, [address] [place], [place] [address]  Phosphorus     Status: Abnormal   Collection Time: 07/13/20  5:00 PM  Result Value Ref Range   Phosphorus 2.4 (L) 2.5 - 4.6 mg/dL    Comment: Performed at [facility] Lab, [address], [place], [place] [address]  CBC     Status: Abnormal   Collection Time: 07/13/20  5:00 PM  Result Value Ref Range   WBC 7.9 4.0 - 10.5 K/uL   RBC 2.90 (L) 3.87 - 5.11 MIL/uL   Hemoglobin 7.6 (L) 12.0 - 15.0 g/dL     Comment: Reticulocyte Hemoglobin testing may be clinically indicated, consider ordering this additional test [id number]    HCT 22.6 (L) 36.0 - 46.0 %   MCV 77.9 (L) 80.0 - 100.0 fL   MCH 26.2 26.0 - 34.0 pg   MCHC 33.6 30.0 - 36.0 g/dL   RDW 62.1 30.8 - 65.7 %   Platelets 238 150 - 400 K/uL   nRBC 0.0 0.0 - 0.2 %    Comment: Performed at [facility] Lab, [address], [place], [place] [address]  Osmolality     Status: Abnormal   Collection Time: 07/13/20  7:33 PM  Result Value Ref Range   Osmolality 235 (LL) 275 - 295 mOsm/kg    Comment: REPEATED TO VERIFY CRITICAL RESULT CALLED TO, READ BACK BY AND VERIFIED WITH: WOLFE,J RN 07/13/2020 AT 2039 SKEEN,P Performed at [facility] Lab, [address], [place], [place] [address]   Cortisol-am, blood     Status: None   Collection Time: 07/13/20  7:33 PM  Result Value Ref Range   Cortisol - AM 12.6 6.7 - 22.6 ug/dL    Comment: Performed at [facility] Lab, [address], [place], [place] [address]  Hemoglobin A1c     Status: Abnormal   Collection Time: 07/13/20  7:33 PM  Result Value Ref Range   Hgb A1c MFr Bld 6.8 (H) 4.8 - 5.6 %    Comment: (NOTE) Pre diabetes:          5.7%-6.4%  Diabetes:              >6.4%  Glycemic control for   <7.0% adults with diabetes    Mean Plasma Glucose 148.46 mg/dL    Comment: Performed at [facility] Lab, [address], [place], [place] [address]  Glucose, capillary     Status: None   Collection Time: 07/13/20  7:55 PM  Result Value Ref Range   Glucose-Capillary 92 70 - 99 mg/dL    Comment: Glucose reference range applies only to samples taken after fasting for at least 8 hours.  MRSA PCR Screening     Status: None   Collection Time: 07/13/20  8:43 PM   Specimen: Nasopharyngeal  Result Value Ref Range   MRSA by PCR NEGATIVE NEGATIVE    Comment:        The GeneXpert MRSA Assay (FDA approved for NASAL specimens only), is one component of a comprehensive MRSA  colonization surveillance program. It is not intended to diagnose MRSA infection nor to guide or monitor treatment for MRSA infections. Performed at [facility] Lab, [address] [place], [place] [address]   Culture, blood (routine x 2)     Status: None (Preliminary result)   Collection Time: 07/13/20  9:14 PM   Specimen: BLOOD  Result Value Ref Range   Specimen Description BLOOD BLOOD RIGHT FOREARM    Special Requests      BOTTLES DRAWN AEROBIC ONLY Blood Culture results may not be optimal due to an inadequate volume of blood received in culture bottles   Culture      NO GROWTH < 12 HOURS Performed at [facility] Lab, [address], [place], [place] [address]    Report Status PENDING   Basic metabolic panel     Status: Abnormal   Collection Time: 07/13/20  9:17 PM  Result Value Ref Range   Sodium 109 (LL) 135 - 145 mmol/L    Comment: RESULT REPEATED AND VERIFIED CRITICAL RESULT CALLED TO, READ BACK BY AND VERIFIED WITH: Zenia Resides, RN 2222 07/13/20 A. MCDOWELL    Potassium 3.1 (L) 3.5 - 5.1 mmol/L   Chloride 78 (L) 98 - 111 mmol/L   CO2 25 22 - 32 mmol/L   Glucose, Bld 89 70 - 99 mg/dL    Comment: Glucose reference range applies only to samples taken after fasting for at least 8 hours.   BUN <5 (L) 8 - 23 mg/dL   Creatinine, Ser <1.51 (L) 0.44 - 1.00 mg/dL   Calcium 7.5 (L) 8.9 - 10.3 mg/dL   GFR, Estimated NOT CALCULATED >60 mL/min    Comment: (NOTE) Calculated using the CKD-EPI Creatinine Equation (2021)    Anion gap 6 5 - 15    Comment: Performed at [facility], [address], [place], [place] [address]  Lactic acid, plasma     Status: None   Collection Time: 07/13/20  9:17 PM  Result Value Ref Range   Lactic Acid, Venous 0.8 0.5 - 1.9 mmol/L    Comment: Performed at [facility], [address] [place], [place] [address]  Procalcitonin - Baseline     Status: None   Collection Time: 07/13/20  9:17 PM  Result Value Ref Range   Procalcitonin  <0.10 ng/mL    Comment:        Interpretation: PCT (Procalcitonin) <= 0.5 ng/mL: Systemic infection (sepsis) is not likely. Local bacterial infection is possible. (NOTE)       Sepsis PCT Algorithm           Lower Respiratory Tract                                      Infection PCT Algorithm    ----------------------------     ----------------------------         PCT < 0.25 ng/mL                PCT < 0.10 ng/mL          Strongly encourage             Strongly discourage   discontinuation of antibiotics    initiation of antibiotics    ----------------------------     -----------------------------       PCT 0.25 - 0.50 ng/mL            PCT 0.10 - 0.25 ng/mL               OR       >80%  decrease in PCT            Discourage initiation of                                            antibiotics      Encourage discontinuation           of antibiotics    ----------------------------     -----------------------------         PCT >= 0.50 ng/mL              PCT 0.26 - 0.50 ng/mL               AND        <80% decrease in PCT             Encourage initiation of                                             antibiotics       Encourage continuation           of antibiotics    ----------------------------     -----------------------------        PCT >= 0.50 ng/mL                  PCT > 0.50 ng/mL               AND         increase in PCT                  Strongly encourage                                      initiation of antibiotics    Strongly encourage escalation           of antibiotics                                     -----------------------------                                           PCT <= 0.25 ng/mL                                                 OR                                        > 80% decrease in PCT                                      Discontinue / Do not initiate  antibiotics  Performed at [facility] Lab, [address], [place], [place] [address]   Culture, blood (routine x 2)     Status: None (Preliminary result)   Collection Time: 07/13/20  9:23 PM   Specimen: BLOOD  Result Value Ref Range   Specimen Description BLOOD RIGHT ANTECUBITAL    Special Requests      BOTTLES DRAWN AEROBIC ONLY Blood Culture results may not be optimal due to an inadequate volume of blood received in culture bottles   Culture      NO GROWTH < 12 HOURS Performed at [facility] Lab, [address], [place], [place] [address]    Report Status PENDING   Glucose, capillary     Status: None   Collection Time: 07/13/20 10:55 PM  Result Value Ref Range   Glucose-Capillary 77 70 - 99 mg/dL    Comment: Glucose reference range applies only to samples taken after fasting for at least 8 hours.  Basic metabolic panel     Status: Abnormal   Collection Time: 07/14/20  1:11 AM  Result Value Ref Range   Sodium 110 (LL) 135 - 145 mmol/L    Comment: CRITICAL RESULT CALLED TO, READ BACK BY AND VERIFIED WITH: R. PROCTOR,RN. 0153 07/14/20 A. MCDOWELL    Potassium 4.5 3.5 - 5.1 mmol/L   Chloride 80 (L) 98 - 111 mmol/L   CO2 20 (L) 22 - 32 mmol/L   Glucose, Bld 71 70 - 99 mg/dL    Comment: Glucose reference range applies only to samples taken after fasting for at least 8 hours.   BUN 5 (L) 8 - 23 mg/dL   Creatinine, Ser <7.84 (L) 0.44 - 1.00 mg/dL   Calcium 7.4 (L) 8.9 - 10.3 mg/dL   GFR, Estimated NOT CALCULATED >60 mL/min    Comment: (NOTE) Calculated using the CKD-EPI Creatinine Equation (2021)    Anion gap 10 5 - 15    Comment: Performed at [facility] Lab, [address], [place], [place] [address]  Osmolality     Status: Abnormal   Collection Time: 07/14/20  1:11 AM  Result Value Ref Range   Osmolality 223 (LL) 275 - 295 mOsm/kg    Comment: REPEATED TO VERIFY CRITICAL RESULT CALLED TO, READ BACK BY AND VERIFIED WITH: PROCTOR,R RN 07/14/2020 AT 0205 SKEEN,P Performed at [facility], [address]  [place], [place] [address]   Glucose, capillary     Status: None   Collection Time: 07/14/20  4:22 AM  Result Value Ref Range   Glucose-Capillary 73 70 - 99 mg/dL    Comment: Glucose reference range applies only to samples taken after fasting for at least 8 hours.  CBC     Status: Abnormal   Collection Time: 07/14/20  6:47 AM  Result Value Ref Range   WBC 6.4 4.0 - 10.5 K/uL   RBC 3.00 (L) 3.87 - 5.11 MIL/uL   Hemoglobin 7.9 (L) 12.0 - 15.0 g/dL   HCT 32.4 (L) 40.1 - 02.7 %   MCV 78.0 (L) 80.0 - 100.0 fL   MCH 26.3 26.0 - 34.0 pg   MCHC 33.8 30.0 - 36.0 g/dL   RDW 25.3 66.4 - 40.3 %   Platelets 243 150 - 400 K/uL   nRBC 0.0 0.0 - 0.2 %    Comment: Performed at [facility] Lab, [address] [place], [place] [address]  Basic metabolic panel     Status: Abnormal  Collection Time: 07/14/20  6:47 AM  Result Value Ref Range   Sodium 113 (LL) 135 - 145 mmol/L    Comment: CRITICAL RESULT CALLED TO, READ BACK BY AND VERIFIED WITH: ROBIN ROBERTS,RN AT 0816 07/14/2020 BY ZBEECH.    Potassium 3.0 (L) 3.5 - 5.1 mmol/L   Chloride 80 (L) 98 - 111 mmol/L   CO2 26 22 - 32 mmol/L   Glucose, Bld 67 (L) 70 - 99 mg/dL    Comment: Glucose reference range applies only to samples taken after fasting for at least 8 hours.   BUN <5 (L) 8 - 23 mg/dL   Creatinine, Ser <3.71 (L) 0.44 - 1.00 mg/dL   Calcium 7.4 (L) 8.9 - 10.3 mg/dL   GFR, Estimated NOT CALCULATED >60 mL/min    Comment: (NOTE) Calculated using the CKD-EPI Creatinine Equation (2021)    Anion gap 7 5 - 15    Comment: Performed at [facility], [address], [place], [place] [address]  TSH     Status: None   Collection Time: 07/14/20  6:47 AM  Result Value Ref Range   TSH 2.255 0.350 - 4.500 uIU/mL    Comment: Performed by a 3rd Generation assay with a functional sensitivity of <=0.01 uIU/mL. Performed at [facility], [address], [place], [place] [address]   Magnesium     Status: Abnormal   Collection Time:  07/14/20  6:47 AM  Result Value Ref Range   Magnesium 1.2 (L) 1.7 - 2.4 mg/dL    Comment: Performed at [facility] Lab, [address], [place], [place] [address]  Phosphorus     Status: None   Collection Time: 07/14/20  6:47 AM  Result Value Ref Range   Phosphorus 2.6 2.5 - 4.6 mg/dL    Comment: Performed at [facility] Lab, [address], [place], [place] [address]  Glucose, capillary     Status: None   Collection Time: 07/14/20  8:02 AM  Result Value Ref Range   Glucose-Capillary 79 70 - 99 mg/dL    Comment: Glucose reference range applies only to samples taken after fasting for at least 8 hours.   DG CHEST PORT 1 VIEW  Result Date: 07/13/2020 CLINICAL DATA:  Weakness. EXAM: PORTABLE CHEST 1 VIEW COMPARISON:  None. FINDINGS: 2118 hours. Bibasilar atelectasis/infiltrate noted with small bilateral pleural effusions. The cardiopericardial silhouette is within normal limits for size. The visualized bony structures of the thorax show no acute abnormality. Degenerative changes noted right shoulder. Telemetry leads overlie the chest. IMPRESSION: Bibasilar atelectasis/infiltrate with small bilateral pleural effusions. Electronically Signed   By: Kennith Center M.D.   On: 07/13/2020 21:30    Review of Systems  Unable to perform ROS: Mental status change  When asked how she feels, the patient only states "I don't know". She responds to all specific ROS questions with "no", including ones that contradict exam findings.  Blood pressure (!) 121/42, pulse (!) 56, temperature (!) 97.3 F (36.3 C), temperature source Axillary, resp. rate 17, height 5\' 5"  (1.651 m), weight 57.2 kg, SpO2 98 %. Physical Exam Constitutional:      Appearance: She is ill-appearing.     Comments: Lethargic  HENT:     Head: Normocephalic and atraumatic.     Nose:     Comments: NG tube right nare Eyes:     Pupils: Pupils are equal, round, and reactive to light.  Cardiovascular:     Rate and Rhythm: Bradycardia  present. Rhythm irregular.     Heart sounds: Normal heart sounds.  Pulmonary:     Effort: Pulmonary effort is normal.  Abdominal:     General: There is no distension.     Palpations: Abdomen is soft.     Tenderness: There is no abdominal tenderness. There is no guarding.  Musculoskeletal:        General: Tenderness (R hip) present.     Right lower leg: No edema.     Left lower leg: No edema.     Comments: RUE 3+ pitting edema Soft boots on both LE  Skin:    General: Skin is warm.      Assessment/Plan  1. Significant hyponatremia - Na 110 on 4/19 at 11:38, has increased to 113 as of 06:47 today. Good rate of correction at 3 mmol/L over 8 hrs. - TSH and cortisol levels normal at 2.25 and 12.6, respectively. - Urine Osm slightly low at 269, Urine Na 98 - Current working differential: chlorthalidone-induced vs SIADH. Would have expected better initial sodium correction once chlorthalidone was discontinued if that was the cause. - Chlorthalidone (home med) has been discontinued - Continue 3% saline infusion at 30 mL/hr, carefully monitoring sodium q4h - Plan is to discontinue the 3% saline and initiate Lasix once serum Na has reached around 118.  2. Hypocalcemia - 1g Ca gluconate administered 4/19. Lab Ca 7., corrected to 9.2 mg/dL in setting of hypoalbuminemia - Continue to monitor  3. Anemia - Hgb has improved from 6.5 -> 7.9. Continue to monitor.  4. Hypomagnesemia - Serum mag 1.2 this a.m. - Mag sulfate 4g (50 mL/hr) IV administered, recheck serum level  5. Hypokalemia - replete prn   I have seen and examined this patient and agree with the plan of care. We will continue to monitor patient with 3% NS at 30 cc /hr . Chlorthalidone has been stopped at this point  Garnetta BuddyMartin W Anita Laguna 07/14/2020, 1:33 PM    Morton PetersBobbi J Hurst 07/14/2020, 9:25 AM

## 2020-07-15 DIAGNOSIS — E871 Hypo-osmolality and hyponatremia: Secondary | ICD-10-CM | POA: Diagnosis not present

## 2020-07-15 LAB — URINE CULTURE: Culture: NO GROWTH

## 2020-07-15 LAB — BLOOD CULTURE ID PANEL (REFLEXED) - BCID2

## 2020-07-15 LAB — SODIUM
Sodium: 118 mmol/L — CL (ref 135–145)
Sodium: 119 mmol/L — CL (ref 135–145)
Sodium: 120 mmol/L — ABNORMAL LOW (ref 135–145)

## 2020-07-15 LAB — BASIC METABOLIC PANEL
Anion gap: 5 (ref 5–15)
BUN: 6 mg/dL — ABNORMAL LOW (ref 8–23)
CO2: 26 mmol/L (ref 22–32)
Calcium: 7.9 mg/dL — ABNORMAL LOW (ref 8.9–10.3)
Chloride: 90 mmol/L — ABNORMAL LOW (ref 98–111)
Creatinine, Ser: 0.38 mg/dL — ABNORMAL LOW (ref 0.44–1.00)
GFR, Estimated: >60 mL/min (ref >60)
Glucose, Bld: 206 mg/dL — ABNORMAL HIGH (ref 70–99)
Potassium: 4.3 mmol/L (ref 3.5–5.1)
Sodium: 121 mmol/L — ABNORMAL LOW (ref 135–145)

## 2020-07-15 LAB — GLUCOSE, CAPILLARY
Glucose-Capillary: 149 mg/dL — ABNORMAL HIGH (ref 70–99)
Glucose-Capillary: 158 mg/dL — ABNORMAL HIGH (ref 70–99)
Glucose-Capillary: 163 mg/dL — ABNORMAL HIGH (ref 70–99)
Glucose-Capillary: 190 mg/dL — ABNORMAL HIGH (ref 70–99)
Glucose-Capillary: 195 mg/dL — ABNORMAL HIGH (ref 70–99)
Glucose-Capillary: 251 mg/dL — ABNORMAL HIGH (ref 70–99)

## 2020-07-15 LAB — MAGNESIUM
Magnesium: 1.9 mg/dL (ref 1.7–2.4)
Magnesium: 2 mg/dL (ref 1.7–2.4)

## 2020-07-15 LAB — PHOSPHORUS
Phosphorus: 1.4 mg/dL — ABNORMAL LOW (ref 2.5–4.6)
Phosphorus: 3 mg/dL (ref 2.5–4.6)

## 2020-07-15 MED ORDER — SODIUM CHLORIDE 0.9 % IV SOLN: INTRAVENOUS | Status: DC

## 2020-07-15 MED ORDER — MAGNESIUM SULFATE 2 GM/50ML IV SOLN
2.0000 g | Freq: Once | INTRAVENOUS | Status: AC
  Administered 2020-07-15: 2 g via INTRAVENOUS
  Filled 2020-07-15: qty 50

## 2020-07-15 MED ORDER — POTASSIUM PHOSPHATES 15 MMOLE/5ML IV SOLN
30.0000 mmol | Freq: Once | INTRAVENOUS | Status: AC
  Administered 2020-07-15: 30 mmol via INTRAVENOUS
  Filled 2020-07-15: qty 10

## 2020-07-15 MED ORDER — FUROSEMIDE 10 MG/ML IJ SOLN
40.0000 mg | Freq: Once | INTRAMUSCULAR | Status: AC
  Administered 2020-07-15: 40 mg via INTRAVENOUS
  Filled 2020-07-15: qty 4

## 2020-07-15 NOTE — Progress Notes (Signed)
NAME:  Suzanne Fox, MRN:  [id number], DOB:  [birth year], LOS: 2 ADMISSION DATE:  07/13/2020, CONSULTATION DATE:  07/13/20 REFERRING MD: Kathrene Bongo , CHIEF COMPLAINT:  Fatigue, hyponatremia  History of Present Illness:  87yo female admitted for hyponatremia requiring hypertonic saline Question poor oral intake along with continuation of chlorthalidone as cause   Pertinent  Medical History  3/17-3/25 with spontaneous epidural hematoma requiring C spine decompression and fusion.  Tobacco abuse PAD Hypothyroidism Hypertension Hyperlipidemia Diabetes type 2 Dementia Arthritis,  Recent hx of PNA and UTI  Significant Hospital Events: Including procedures, antibiotic start and stop dates in addition to other pertinent events   . 4/19 admitted for hyponatremia, hypertonic saline started   . 4/20 BUE dopplers>> NEG  Interim History / Subjective:  No acute events overnight  Na 121 this am (114->116->115->118->121 over ~24hrs)  Objective   Blood pressure (!) 128/37, pulse 63, temperature 97.9 F (36.6 C), temperature source Oral, resp. rate 13, height 5\' 5"  (1.651 m), weight 61.2 kg, SpO2 99 %.        Intake/Output Summary (Last 24 hours) at 07/15/2020 0955 Last data filed at 07/15/2020 0900 Gross per 24 hour  Intake 1820.22 ml  Output 760 ml  Net 1060.22 ml   Filed Weights   07/13/20 1726 07/15/20 0433  Weight: 57.2 kg 61.2 kg    Examination: General: frail elderly female, NAD  HEENT: [place]/AT, MM pink/moist, PERRL,  Neuro: awake, alert, oriented X2, MAE, interactive  CV: s1s2 regular rate and rhythm, no murmur, rubs, or gallops,  PULM: resps even non labored on RA, clear  GI: soft, bowel sounds active in all 4 quadrants, non-tender, non-distended, core-trak, tol TF Extremities: warm/dry, generalized edema  Skin: no rashes or lesions  Labs/imaging that I havepersonally reviewed    Bmet, mg, phos  Resolved Hospital Problem list     Assessment & Plan:  Hyponatremia   -Likely 2/2 poor solute intake, chlorthalidone.   -consider SIADH, consider hyperaldosterone - improving P: 3% NaCl off for now NS @ 9ml/hr Nephrology consulted, appreciate assistance  Frequent Bmets  COntinue to hold home Chlorthalidone  Maintain neuro protective measures Nutrition and bowel regiment  Seizure precautions  Aspirations precautions   Hypokalemia P: Supplement as needed  UTI -Mod leukocytosis.   Continue ceftriaxone as above - urine culture neg. Low threshold to d/c abx if continues to improve  Anemia  P: Trend cbc   Transfuse per protocol  Hgb goal greater than 7  Hypothyroidism -Home medications includes Synthroid  P: Continue home med  Hypertension -Home medication include Norvasc, Clonidine, lisinopril  Hyperlipidemia -Home meds Lipitor  P: Continuous telemetry  IV PRN antihypertensives  Hold oral meds until mentation improves   Diabetes type 2 -Home meds include Lantus and and SSI P: CBG borderline low in the setting of NPO  CBG q4 SSI  Hold long acting insuline while PO intake is poor   Dementia -Home med include Namenda  P: Resume home med when safe   At risk malnutrition  Failure to thrive  P: Continue TF  Supplement protein  Goals of care discussions, Case mgmt consult   Best practice   Diet:  Tube Feed   Place Coretrack and start TF Pain/Anxiety/Delirium protocol (if indicated): No VAP protocol (if indicated): Not indicated DVT prophylaxis: Subcutaneous Heparin GI prophylaxis: N/A Glucose control:  SSI Yes Central venous access:  N/A Arterial line:  N/A Foley:  Yes, and it is still needed Mobility:  bed rest  PT  consulted: N/A Last date of multidisciplinary goals of care discussion: Update family daily  Code Status:  DNR Disposition: ICU    Dirk Dress, NP Pulmonary/Critical Care Medicine  07/15/2020  9:55 AM

## 2020-07-15 NOTE — Progress Notes (Signed)
Per orders, this RN notified MD Malen Gauze, L. (nephrologist) about pt. Sodium 118. MD stated/ordered to discontinue sodium gtt. This Rn will closely monitor pt. No further orders ordered by MD.

## 2020-07-15 NOTE — Progress Notes (Signed)
eLink Physician-Brief Progress Note Patient Name: Suzanne Fox DOB: 09-26-1929 MRN: [id number]   Date of Service  07/15/2020  HPI/Events of Note  Hyponatremia - Na+ = 121 --> 119 off of 3% NaCl IV infusion. Will give dose of Lasix per Nephrology recommendation.   eICU Interventions  Plan: 1. Lasix 40 mg IV X 1 now.      Intervention Category Major Interventions: Electrolyte abnormality - evaluation and management  Diandre Merica Eugene 07/15/2020, 10:16 PM

## 2020-07-15 NOTE — Progress Notes (Signed)
Inpatient Diabetes Program Recommendations  AACE/ADA: New Consensus Statement on Inpatient Glycemic Control (2015)  Target Ranges:  Prepandial:   less than 140 mg/dL      Peak postprandial:   less than 180 mg/dL (1-2 hours)      Critically ill patients:  140 - 180 mg/dL   Lab Results  Component Value Date   GLUCAP 190 (H) 07/15/2020   HGBA1C 6.8 (H) 07/13/2020    Review of Glycemic Control Results for LYNE, KHURANA (MRN [id number]) as of 07/15/2020 11:22  Ref. Range 07/14/2020 15:47 07/14/2020 19:58 07/14/2020 23:45 07/15/2020 03:22 07/15/2020 07:30  Glucose-Capillary Latest Ref Range: 70 - 99 mg/dL 010 (H) 932 (H) 355 (H) 251 (H) 190 (H)   Diabetes history: DM 2 Outpatient Diabetes medications: Novolog 0-10 units tid, Lantus 10 units Daily Current orders for Inpatient glycemic control:  Novolog 0-15 units Q4 hours  Inpatient Diabetes Program Recommendations:    Osmolite 55 ml/hour  - Consider Novolog 2 units Q4 hours Tube Feed coverage (do not give if tube feeds are stopped or held)  Thanks,  Christena Deem RN, MSN, BC-ADM Inpatient Diabetes Coordinator Team Pager [phone] (8a-5p)

## 2020-07-15 NOTE — Progress Notes (Signed)
PHARMACY - PHYSICIAN COMMUNICATION CRITICAL VALUE ALERT - BLOOD CULTURE IDENTIFICATION (BCID)  Suzanne Fox is an 87 y.o. female who presented to [facility] on 07/13/2020 with a chief complaint of hyponatremia and AMS.  Assessment:  BCx with 1/4 GPC clusters. BCID MRSE.   Name of physician (or Provider) Contacted: Dr. Francine Graven  Current antibiotics: Ceftriaxone  Changes to prescribed antibiotics recommended:  Suspect contaminant - no changes.   Results for orders placed or performed during the hospital encounter of 07/13/20  Blood Culture ID Panel (Reflexed) (Collected: 07/13/2020  9:14 PM)  Result Value Ref Range   Enterococcus faecalis NOT DETECTED NOT DETECTED   Enterococcus Faecium NOT DETECTED NOT DETECTED   Listeria monocytogenes NOT DETECTED NOT DETECTED   Staphylococcus species DETECTED (A) NOT DETECTED   Staphylococcus aureus (BCID) NOT DETECTED NOT DETECTED   Staphylococcus epidermidis DETECTED (A) NOT DETECTED   Staphylococcus lugdunensis NOT DETECTED NOT DETECTED   Streptococcus species NOT DETECTED NOT DETECTED   Streptococcus agalactiae NOT DETECTED NOT DETECTED   Streptococcus pneumoniae NOT DETECTED NOT DETECTED   Streptococcus pyogenes NOT DETECTED NOT DETECTED   A.calcoaceticus-baumannii NOT DETECTED NOT DETECTED   Bacteroides fragilis NOT DETECTED NOT DETECTED   Enterobacterales NOT DETECTED NOT DETECTED   Enterobacter cloacae complex NOT DETECTED NOT DETECTED   Escherichia coli NOT DETECTED NOT DETECTED   Klebsiella aerogenes NOT DETECTED NOT DETECTED   Klebsiella oxytoca NOT DETECTED NOT DETECTED   Klebsiella pneumoniae NOT DETECTED NOT DETECTED   Proteus species NOT DETECTED NOT DETECTED   Salmonella species NOT DETECTED NOT DETECTED   Serratia marcescens NOT DETECTED NOT DETECTED   Haemophilus influenzae NOT DETECTED NOT DETECTED   Neisseria meningitidis NOT DETECTED NOT DETECTED   Pseudomonas aeruginosa NOT DETECTED NOT DETECTED   Stenotrophomonas  maltophilia NOT DETECTED NOT DETECTED   Candida albicans NOT DETECTED NOT DETECTED   Candida auris NOT DETECTED NOT DETECTED   Candida glabrata NOT DETECTED NOT DETECTED   Candida krusei NOT DETECTED NOT DETECTED   Candida parapsilosis NOT DETECTED NOT DETECTED   Candida tropicalis NOT DETECTED NOT DETECTED   Cryptococcus neoformans/gattii NOT DETECTED NOT DETECTED   Methicillin resistance mecA/C DETECTED (A) NOT DETECTED    Link Snuffer, PharmD, BCPS, BCCCP Clinical Pharmacist Please refer to [facility] for [facility] Pharmacy numbers 07/15/2020  2:56 PM

## 2020-07-15 NOTE — Plan of Care (Signed)
  Problem: Clinical Measurements: Goal: Ability to maintain clinical measurements within normal limits will improve Outcome: Progressing Goal: Will remain free from infection Outcome: Progressing Goal: Diagnostic test results will improve Outcome: Progressing Goal: Respiratory complications will improve Outcome: Progressing Goal: Cardiovascular complication will be avoided Outcome: Progressing   Problem: Coping: Goal: Level of anxiety will decrease Outcome: Progressing   Problem: Pain Managment: Goal: General experience of comfort will improve Outcome: Progressing   Problem: Safety: Goal: Ability to remain free from injury will improve Outcome: Progressing   

## 2020-07-15 NOTE — Progress Notes (Signed)
[facility] Progress Note   Assessment/ Plan:   1. Significant hyponatremia - Na improved from 110 to 118 over 24 hours; 3% saline was discontinued. Most recent Na 121 this a.m. - Continue 0.9% NS 50 mL/hr continuous IV - Continue monitoring sodium q4h to evaluate continued response. If Na begins to decline with NS, may need to consider Lasix. - TSH and cortisol levels normal at 2.25 and 12.6, respectively. - Urine Osm slightly low at 269, Urine Na 98 - Current working differential: chlorthalidone-induced vs SIADH. Would have expected better initial sodium correction once chlorthalidone was discontinued if that was the cause. - Chlorthalidone (home med) has been discontinued  2. Hypocalcemia - 1g Ca gluconate administered 4/19. Lab Ca 7., corrected to 9.2 mg/dL in setting of hypoalbuminemia - Continue to monitor  3. Anemia - Hgb has improved from 6.5 -> 7.9. Continue to monitor.  4. Hypomagnesemia - Serum mag improved to 1.9 with repletion. Continue to monitor  5. Hypokalemia - replete prn  Subjective:   [name] sodium has improved to 121 and 3% saline has been discontinued. Renal function has remained good and other electrolyte abnormalities have improved. On evaluation this morning, patient was resting in hospital bed covered with bear hugger. She is still somewhat lethargic, but more responsive than yesterday, answering questions and following commands. Her only complaint is feeling cold, stating that she is "still warming up" with the bear hugger. She denies pain anywhere including HA; denies n/v, dyspnea.    Objective:   BP (!) 126/36 (BP Location: Left Arm)   Pulse 64   Temp 97.9 F (36.6 C) (Oral)   Resp 14   Ht 5\' 5"  (1.651 m)   Wt 61.2 kg   SpO2 100%   BMI 22.45 kg/m   Physical Exam: Gen: Lying in hospital bed, resting comfortably, in NAD CVS: RRR, no murmurs or rubs Resp: Breathing unlabored, BS clear & equal bilaterally Abd: Soft,  non-tender Ext: Warm, dry. RUE 1+ edema, no edema noted in other extremities.  Labs: BMET Recent Labs  Lab 07/13/20 1138 07/13/20 1700 07/13/20 2117 07/14/20 0111 07/14/20 0647 07/14/20 1220 07/14/20 1650 07/14/20 2141 07/14/20 2339 07/15/20 0657  NA 110* 110* 109* 110* 113* 114* 116* 115* 118* 121*  K 5.4*  --  3.1* 4.5 3.0* 3.0*  --   --   --  4.3  CL 84*  --  78* 80* 80* 82*  --   --   --  90*  CO2 21*  --  25 20* 26 27  --   --   --  26  GLUCOSE 83  --  89 71 67* 74  --   --   --  206*  BUN 5*  --  <5* 5* <5* <5*  --   --   --  6*  CREATININE <0.30*  --  <0.30* <0.30* <0.30* <0.30*  --   --   --  0.38*  CALCIUM 6.4*  --  7.5* 7.4* 7.4* 7.5*  --   --   --  7.9*  PHOS  --  2.4*  --   --  2.6  --  2.4*  --   --  1.4*   CBC Recent Labs  Lab 07/13/20 1138 07/13/20 1700 07/14/20 0647  WBC 7.1 7.9 6.4  HGB 6.5* 7.6* 7.9*  HCT 20.4* 22.6* 23.4*  MCV 82.6 77.9* 78.0*  PLT 200 238 243      Medications:    . chlorhexidine  15 mL Mouth Rinse BID  . Chlorhexidine Gluconate Cloth  6 each Topical Q0600  . heparin  5,000 Units Subcutaneous Q8H  . insulin aspart  0-15 Units Subcutaneous Q4H  . mouth rinse  15 mL Mouth Rinse q12n4p     Suzanne Fox Suzanne Fox 07/15/2020, 8:23 AM

## 2020-07-16 DIAGNOSIS — E871 Hypo-osmolality and hyponatremia: Secondary | ICD-10-CM | POA: Diagnosis not present

## 2020-07-16 LAB — GLUCOSE, CAPILLARY
Glucose-Capillary: 208 mg/dL — ABNORMAL HIGH (ref 70–99)
Glucose-Capillary: 208 mg/dL — ABNORMAL HIGH (ref 70–99)
Glucose-Capillary: 196 mg/dL — ABNORMAL HIGH (ref 70–99)
Glucose-Capillary: 229 mg/dL — ABNORMAL HIGH (ref 70–99)
Glucose-Capillary: 189 mg/dL — ABNORMAL HIGH (ref 70–99)
Glucose-Capillary: 221 mg/dL — ABNORMAL HIGH (ref 70–99)

## 2020-07-16 LAB — SODIUM
Sodium: 120 mmol/L — ABNORMAL LOW (ref 135–145)
Sodium: 122 mmol/L — ABNORMAL LOW (ref 135–145)
Sodium: 122 mmol/L — ABNORMAL LOW (ref 135–145)
Sodium: 123 mmol/L — ABNORMAL LOW (ref 135–145)

## 2020-07-16 LAB — CBC
HCT: 23 % — ABNORMAL LOW (ref 36.0–46.0)
Hemoglobin: 7.4 g/dL — ABNORMAL LOW (ref 12.0–15.0)
MCH: 25.6 pg — ABNORMAL LOW (ref 26.0–34.0)
MCHC: 32.2 g/dL (ref 30.0–36.0)
MCV: 79.6 fL — ABNORMAL LOW (ref 80.0–100.0)
Platelets: 198 10*3/uL (ref 150–400)
RBC: 2.89 MIL/uL — ABNORMAL LOW (ref 3.87–5.11)
RDW: 14.8 % (ref 11.5–15.5)
WBC: 6.3 10*3/uL (ref 4.0–10.5)
nRBC: 0 % (ref 0.0–0.2)

## 2020-07-16 LAB — CULTURE, BLOOD (ROUTINE X 2)

## 2020-07-16 LAB — PHOSPHORUS: Phosphorus: 2.4 mg/dL — ABNORMAL LOW (ref 2.5–4.6)

## 2020-07-16 LAB — BASIC METABOLIC PANEL
Anion gap: 5 (ref 5–15)
BUN: 5 mg/dL — ABNORMAL LOW (ref 8–23)
CO2: 25 mmol/L (ref 22–32)
Calcium: 7.8 mg/dL — ABNORMAL LOW (ref 8.9–10.3)
Chloride: 90 mmol/L — ABNORMAL LOW (ref 98–111)
Creatinine, Ser: 0.33 mg/dL — ABNORMAL LOW (ref 0.44–1.00)
GFR, Estimated: >60 mL/min (ref >60)
Glucose, Bld: 189 mg/dL — ABNORMAL HIGH (ref 70–99)
Potassium: 4.5 mmol/L (ref 3.5–5.1)
Sodium: 120 mmol/L — ABNORMAL LOW (ref 135–145)

## 2020-07-16 LAB — MAGNESIUM: Magnesium: 1.8 mg/dL (ref 1.7–2.4)

## 2020-07-16 MED ORDER — MEMANTINE HCL 10 MG PO TABS
10.0000 mg | ORAL_TABLET | Freq: Every day | ORAL | Status: DC
  Administered 2020-07-16 – 2020-07-17 (×2): 10 mg
  Filled 2020-07-16 (×2): qty 1

## 2020-07-16 MED ORDER — FUROSEMIDE 10 MG/ML IJ SOLN
20.0000 mg | Freq: Two times a day (BID) | INTRAMUSCULAR | Status: DC
  Administered 2020-07-16 – 2020-07-17 (×2): 20 mg via INTRAVENOUS
  Filled 2020-07-16 (×2): qty 2

## 2020-07-16 MED ORDER — LEVOTHYROXINE SODIUM 25 MCG PO TABS
25.0000 ug | ORAL_TABLET | Freq: Every day | ORAL | Status: DC
  Administered 2020-07-17 – 2020-08-06 (×21): 25 ug via ORAL
  Filled 2020-07-16 (×22): qty 1

## 2020-07-16 MED ORDER — POTASSIUM & SODIUM PHOSPHATES 280-160-250 MG PO PACK
1.0000 | PACK | Freq: Three times a day (TID) | ORAL | Status: AC
  Administered 2020-07-16 (×2): 1
  Filled 2020-07-16 (×2): qty 1

## 2020-07-16 NOTE — Progress Notes (Signed)
PHARMACY - PHYSICIAN COMMUNICATION CRITICAL VALUE ALERT - BLOOD CULTURE IDENTIFICATION (BCID)  Suzanne Fox is an 87 y.o. female who presented to [facility] on 07/13/2020 with a chief complaint of hyponatremia and AM  Assessment:   1/2 blood cultures now growing Gram Positive Rods. Previously, the other blood culture reported as growing MRSE.  Both are likely contaminants  Name of physician (or Provider) Contacted: Dr.Sommer  Current antibiotics: Ceftriaxone  Changes to prescribed antibiotics recommended:  None at this time--likely contaminants  Results for orders placed or performed during the hospital encounter of 07/13/20  Blood Culture ID Panel (Reflexed) (Collected: 07/13/2020  9:14 PM)  Result Value Ref Range   Enterococcus faecalis NOT DETECTED NOT DETECTED   Enterococcus Faecium NOT DETECTED NOT DETECTED   Listeria monocytogenes NOT DETECTED NOT DETECTED   Staphylococcus species DETECTED (A) NOT DETECTED   Staphylococcus aureus (BCID) NOT DETECTED NOT DETECTED   Staphylococcus epidermidis DETECTED (A) NOT DETECTED   Staphylococcus lugdunensis NOT DETECTED NOT DETECTED   Streptococcus species NOT DETECTED NOT DETECTED   Streptococcus agalactiae NOT DETECTED NOT DETECTED   Streptococcus pneumoniae NOT DETECTED NOT DETECTED   Streptococcus pyogenes NOT DETECTED NOT DETECTED   A.calcoaceticus-baumannii NOT DETECTED NOT DETECTED   Bacteroides fragilis NOT DETECTED NOT DETECTED   Enterobacterales NOT DETECTED NOT DETECTED   Enterobacter cloacae complex NOT DETECTED NOT DETECTED   Escherichia coli NOT DETECTED NOT DETECTED   Klebsiella aerogenes NOT DETECTED NOT DETECTED   Klebsiella oxytoca NOT DETECTED NOT DETECTED   Klebsiella pneumoniae NOT DETECTED NOT DETECTED   Proteus species NOT DETECTED NOT DETECTED   Salmonella species NOT DETECTED NOT DETECTED   Serratia marcescens NOT DETECTED NOT DETECTED   Haemophilus influenzae NOT DETECTED NOT DETECTED   Neisseria  meningitidis NOT DETECTED NOT DETECTED   Pseudomonas aeruginosa NOT DETECTED NOT DETECTED   Stenotrophomonas maltophilia NOT DETECTED NOT DETECTED   Candida albicans NOT DETECTED NOT DETECTED   Candida auris NOT DETECTED NOT DETECTED   Candida glabrata NOT DETECTED NOT DETECTED   Candida krusei NOT DETECTED NOT DETECTED   Candida parapsilosis NOT DETECTED NOT DETECTED   Candida tropicalis NOT DETECTED NOT DETECTED   Cryptococcus neoformans/gattii NOT DETECTED NOT DETECTED   Methicillin resistance mecA/C DETECTED (A) NOT DETECTED    Eddie Candle 07/16/2020  5:00 AM

## 2020-07-16 NOTE — Progress Notes (Signed)
Progress Note    ARIA JARRARD  JXB:[id number] DOB: 18-Nov-1929  DOA: 07/13/2020 PCP: Georgianne Fick, MD    Brief Narrative:     Medical records reviewed and are as summarized below:  [name] is an 87 y.o. female with PMH significant for HTN, DM2, dementia and recent cervical surgery for hematoma who lives at [facility] healthcare is brought in for somnolence.   Assessment/Plan:   Principal Problem:   Hyponatremia Active Problems:   Benign essential HTN   Type 2 diabetes mellitus without complication (HCC)   Malnutrition of moderate degree   Hyponatremia - is on NaCl 1 gram TID at SNF -Likely 2/2 poor solute intake, chlorthalidone.   -consider SIADH - improving - 3% NaCl off for now -NS - increase amount and add lasix Nephrology consulted, appreciate assistance   Hypokalemia Supplement as needed  UTI -Mod leukocytosis.   Continue ceftriaxone - stop date placed  Anemia  Trend cbc   Transfuse per protocol  Hgb goal greater than 7  Hypothyroidism -Home medications includes Synthroid  -TSH normal  Hypertension -hold HCTZ  Hyperlipidemia -Home meds Lipitor   Diabetes type 2 -SSI  Dementia -Home med include Namenda   +blood cultures with what appears to be a contaminant  Patient is NPO and I am unclear why.  Will ask SLP to see prior to resuming diet -currently with cortrack and tube feeds    Family Communication/Anticipated D/C date and plan/Code Status   DVT prophylaxis: Lovenox ordered. Code Status: DNR  Disposition Plan: Status is: Inpatient  Remains inpatient appropriate because:Inpatient level of care appropriate due to severity of illness   Dispo: The patient is from: SNF              Anticipated d/c is to: SNF              Patient currently is not medically stable to d/c.   Difficult to place patient No         Medical Consultants:   neuro    Subjective:   Pleasantly confused-- says she lives at home  with her sister  Objective:    Vitals:   07/16/20 1000 07/16/20 1100 07/16/20 1137 07/16/20 1200  BP: (!) 143/70 (!) 158/61  (!) 141/51  Pulse: 76 69  70  Resp:    14  Temp:   97.9 F (36.6 C)   TempSrc:   Axillary   SpO2:    100%  Weight:      Height:        Intake/Output Summary (Last 24 hours) at 07/16/2020 1322 Last data filed at 07/16/2020 1204 Gross per 24 hour  Intake 2002.21 ml  Output 3485 ml  Net -1482.79 ml   Filed Weights   07/13/20 1726 07/15/20 0433 07/16/20 0432  Weight: 57.2 kg 61.2 kg 62.2 kg    Exam:  General: Appearance:    elderly female in no acute distress     Lungs:     respirations unlabored  Heart:    Normal heart rate. Normal rhythm. No murmurs, rubs, or gallops.   MS:   All extremities are intact. +edema in extremities   Neurologic:   Awake, alert, pleasantly confused      Data Reviewed:   I have personally reviewed following labs and imaging studies:  Labs: Labs show the following:   Basic Metabolic Panel: Recent Labs  Lab 07/14/20 0111 07/14/20 0647 07/14/20 1220 07/14/20 1650 07/14/20 2141 07/15/20 0657 07/15/20 1637 07/15/20  2058 07/16/20 0046 07/16/20 0538 07/16/20 0926  NA 110* 113* 114* 116*   < > 121* 120* 119* 120* 120* 122*  K 4.5 3.0* 3.0*  --   --  4.3  --   --   --  4.5  --   CL 80* 80* 82*  --   --  90*  --   --   --  90*  --   CO2 20* 26 27  --   --  26  --   --   --  25  --   GLUCOSE 71 67* 74  --   --  206*  --   --   --  189*  --   BUN 5* <5* <5*  --   --  6*  --   --   --  5*  --   CREATININE <0.30* <0.30* <0.30*  --   --  0.38*  --   --   --  0.33*  --   CALCIUM 7.4* 7.4* 7.5*  --   --  7.9*  --   --   --  7.8*  --   MG  --  1.2*  --  2.1  --  1.9 2.0  --   --  1.8  --   PHOS  --  2.6  --  2.4*  --  1.4* 3.0  --   --  2.4*  --    < > = values in this interval not displayed.   GFR Estimated Creatinine Clearance: 43.7 mL/min (A) (by C-G formula based on SCr of 0.33 mg/dL (L)). Liver Function  Tests: Recent Labs  Lab 07/13/20 1138  AST 49*  ALT 16  ALKPHOS 55  BILITOT 1.2  PROT 3.2*  ALBUMIN 1.7*   No results for input(s): LIPASE, AMYLASE in the last 168 hours. No results for input(s): AMMONIA in the last 168 hours. Coagulation profile No results for input(s): INR, PROTIME in the last 168 hours.  CBC: Recent Labs  Lab 07/13/20 1138 07/13/20 1700 07/14/20 0647 07/16/20 0538  WBC 7.1 7.9 6.4 6.3  HGB 6.5* 7.6* 7.9* 7.4*  HCT 20.4* 22.6* 23.4* 23.0*  MCV 82.6 77.9* 78.0* 79.6*  PLT 200 238 243 198   Cardiac Enzymes: No results for input(s): CKTOTAL, CKMB, CKMBINDEX, TROPONINI in the last 168 hours. BNP (last 3 results) No results for input(s): PROBNP in the last 8760 hours. CBG: Recent Labs  Lab 07/15/20 1925 07/15/20 2353 07/16/20 0347 07/16/20 0723 07/16/20 1136  GLUCAP 195* 149* 208* 208* 196*   D-Dimer: No results for input(s): DDIMER in the last 72 hours. Hgb A1c: Recent Labs    07/13/20 1933  HGBA1C 6.8*   Lipid Profile: No results for input(s): CHOL, HDL, LDLCALC, TRIG, CHOLHDL, LDLDIRECT in the last 72 hours. Thyroid function studies: Recent Labs    07/14/20 0647  TSH 2.255   Anemia work up: No results for input(s): VITAMINB12, FOLATE, FERRITIN, TIBC, IRON, RETICCTPCT in the last 72 hours. Sepsis Labs: Recent Labs  Lab 07/13/20 1138 07/13/20 1700 07/13/20 2117 07/14/20 0647 07/16/20 0538  PROCALCITON  --   --  <0.10  --   --   WBC 7.1 7.9  --  6.4 6.3  LATICACIDVEN  --   --  0.8  --   --     Microbiology Recent Results (from the past 240 hour(s))  SARS CORONAVIRUS 2 (TAT 6-24 HRS) Nasopharyngeal Nasopharyngeal Swab     Status: None  Collection Time: 07/13/20  3:13 PM   Specimen: Nasopharyngeal Swab  Result Value Ref Range Status   SARS Coronavirus 2 NEGATIVE NEGATIVE Final    Comment: (NOTE) SARS-CoV-2 target nucleic acids are NOT DETECTED.  The SARS-CoV-2 RNA is generally detectable in upper and lower respiratory  specimens during the acute phase of infection. Negative results do not preclude SARS-CoV-2 infection, do not rule out co-infections with other pathogens, and should not be used as the sole basis for treatment or other patient management decisions. Negative results must be combined with clinical observations, patient history, and epidemiological information. The expected result is Negative.  Fact Sheet for Patients: HairSlick.nohttps://www.fda.gov/media/138098/download  Fact Sheet for Healthcare Providers: quierodirigir.comhttps://www.fda.gov/media/138095/download  This test is not yet approved or cleared by the [place] FDA and  has been authorized for detection and/or diagnosis of SARS-CoV-2 by FDA under an Emergency Use Authorization (EUA). This EUA will remain  in effect (meaning this test can be used) for the duration of the COVID-19 declaration under Se ction 564(b)(1) of the Act, 21 U.S.C. section 360bbb-3(b)(1), unless the authorization is terminated or revoked sooner.  Performed at [facility], [address] [place], [place] [address]   MRSA PCR Screening     Status: None   Collection Time: 07/13/20  8:43 PM   Specimen: Nasopharyngeal  Result Value Ref Range Status   MRSA by PCR NEGATIVE NEGATIVE Final    Comment:        The GeneXpert MRSA Assay (FDA approved for NASAL specimens only), is one component of a comprehensive MRSA colonization surveillance program. It is not intended to diagnose MRSA infection nor to guide or monitor treatment for MRSA infections. Performed at [facility], [address] [place], [place] [address]   Culture, blood (routine x 2)     Status: Abnormal   Collection Time: 07/13/20  9:14 PM   Specimen: BLOOD  Result Value Ref Range Status   Specimen Description BLOOD BLOOD RIGHT FOREARM  Final   Special Requests   Final    BOTTLES DRAWN AEROBIC ONLY Blood Culture results may not be optimal due to an inadequate volume of blood received in culture  bottles   Culture  Setup Time   Final    GRAM POSITIVE COCCI IN CLUSTERS AEROBIC BOTTLE ONLY CRITICAL RESULT CALLED TO, READ BACK BY AND VERIFIED WITH: Peter MiniumJ FRENS PHARMD 1252 07/15/20 A BROWNING    Culture (A)  Final    STAPHYLOCOCCUS EPIDERMIDIS THE SIGNIFICANCE OF ISOLATING THIS ORGANISM FROM A SINGLE SET OF BLOOD CULTURES WHEN MULTIPLE SETS ARE DRAWN IS UNCERTAIN. PLEASE NOTIFY THE MICROBIOLOGY DEPARTMENT WITHIN ONE WEEK IF SPECIATION AND SENSITIVITIES ARE REQUIRED. Performed at [facility], [address] [place], [place] [address]    Report Status 07/16/2020 FINAL  Final  Blood Culture ID Panel (Reflexed)     Status: Abnormal   Collection Time: 07/13/20  9:14 PM  Result Value Ref Range Status   Enterococcus faecalis NOT DETECTED NOT DETECTED Final   Enterococcus Faecium NOT DETECTED NOT DETECTED Final   Listeria monocytogenes NOT DETECTED NOT DETECTED Final   Staphylococcus species DETECTED (A) NOT DETECTED Final    Comment: CRITICAL RESULT CALLED TO, READ BACK BY AND VERIFIED WITH: Peter MiniumJ FRENS PHARMD 1252 07/15/20 A BROWNING    Staphylococcus aureus (BCID) NOT DETECTED NOT DETECTED Final   Staphylococcus epidermidis DETECTED (A) NOT DETECTED Final    Comment: Methicillin (oxacillin) resistant coagulase negative staphylococcus. Possible blood culture contaminant (unless  isolated from more than one blood culture draw or clinical case suggests pathogenicity). No antibiotic treatment is indicated for blood  culture contaminants. CRITICAL RESULT CALLED TO, READ BACK BY AND VERIFIED WITH: Peter Minium PHARMD 1252 07/15/20 A BROWNING    Staphylococcus lugdunensis NOT DETECTED NOT DETECTED Final   Streptococcus species NOT DETECTED NOT DETECTED Final   Streptococcus agalactiae NOT DETECTED NOT DETECTED Final   Streptococcus pneumoniae NOT DETECTED NOT DETECTED Final   Streptococcus pyogenes NOT DETECTED NOT DETECTED Final   A.calcoaceticus-baumannii NOT DETECTED NOT DETECTED Final    Bacteroides fragilis NOT DETECTED NOT DETECTED Final   Enterobacterales NOT DETECTED NOT DETECTED Final   Enterobacter cloacae complex NOT DETECTED NOT DETECTED Final   Escherichia coli NOT DETECTED NOT DETECTED Final   Klebsiella aerogenes NOT DETECTED NOT DETECTED Final   Klebsiella oxytoca NOT DETECTED NOT DETECTED Final   Klebsiella pneumoniae NOT DETECTED NOT DETECTED Final   Proteus species NOT DETECTED NOT DETECTED Final   Salmonella species NOT DETECTED NOT DETECTED Final   Serratia marcescens NOT DETECTED NOT DETECTED Final   Haemophilus influenzae NOT DETECTED NOT DETECTED Final   Neisseria meningitidis NOT DETECTED NOT DETECTED Final   Pseudomonas aeruginosa NOT DETECTED NOT DETECTED Final   Stenotrophomonas maltophilia NOT DETECTED NOT DETECTED Final   Candida albicans NOT DETECTED NOT DETECTED Final   Candida auris NOT DETECTED NOT DETECTED Final   Candida glabrata NOT DETECTED NOT DETECTED Final   Candida krusei NOT DETECTED NOT DETECTED Final   Candida parapsilosis NOT DETECTED NOT DETECTED Final   Candida tropicalis NOT DETECTED NOT DETECTED Final   Cryptococcus neoformans/gattii NOT DETECTED NOT DETECTED Final   Methicillin resistance mecA/C DETECTED (A) NOT DETECTED Final    Comment: CRITICAL RESULT CALLED TO, READ BACK BY AND VERIFIED WITHPeter Minium PHARMD 1252 07/15/20 A BROWNING Performed at [facility], [address] [place], [place] [address]   Culture, Urine     Status: None   Collection Time: 07/13/20  9:20 PM   Specimen: Urine, Random  Result Value Ref Range Status   Specimen Description URINE, RANDOM  Final   Special Requests NONE  Final   Culture   Final    NO GROWTH Performed at [facility], [address] [place], [place] [address]    Report Status 07/15/2020 FINAL  Final  Culture, blood (routine x 2)     Status: None (Preliminary result)   Collection Time: 07/13/20  9:23 PM   Specimen: BLOOD  Result Value Ref Range Status    Specimen Description BLOOD RIGHT ANTECUBITAL  Final   Special Requests   Final    BOTTLES DRAWN AEROBIC ONLY Blood Culture results may not be optimal due to an inadequate volume of blood received in culture bottles   Culture  Setup Time   Final    AEROBIC BOTTLE ONLY GRAM POSITIVE RODS CRITICAL RESULT CALLED TO, READ BACK BY AND VERIFIED WITH: G ABBOTT PHARMD 07/16/20 0442 JDW    Culture   Final    NO GROWTH 3 DAYS Performed at [facility], [address], [place], [place] [address]    Report Status PENDING  Incomplete    Procedures and diagnostic studies:  No results found.  Medications:   . chlorhexidine  15 mL Mouth Rinse BID  . Chlorhexidine Gluconate Cloth  6 each Topical Q0600  . furosemide  20 mg Intravenous Q12H  . heparin  5,000 Units Subcutaneous Q8H  . insulin  aspart  0-15 Units Subcutaneous Q4H  . mouth rinse  15 mL Mouth Rinse q12n4p   Continuous Infusions: . sodium chloride 50 mL/hr at 07/15/20 1036  . sodium chloride 100 mL/hr at 07/16/20 1319  . cefTRIAXone (ROCEPHIN)  IV Stopped (07/15/20 2300)  . feeding supplement (OSMOLITE 1.2 CAL) 1,000 mL (07/14/20 1209)     LOS: 3 days   Joseph Art  [facility]   How to contact the [facility] Attending or Consulting provider 7A - 7P or covering provider during after hours 7P -7A, for this patient?  1. Check the care team in [facility] and look for a) attending/consulting TR[facility] provider listed and b) the [facility] team listed 2. Log into [email or web address] and use [employer] universal password to access. If you do not have the password, please contact the hospital operator. 3. Locate the [facility] provider you are looking for under [facility] and page to a number that you can be directly reached. 4. If you still have difficulty reaching the provider, please page the [facility] (Director on Call) for the Hospitalists listed on amion for assistance.  07/16/2020, 1:22 PM

## 2020-07-16 NOTE — Progress Notes (Cosign Needed)
[facility] Progress Note   Assessment/ Plan:   1.Significant hyponatremia - Na began to decline yesterday evening from 121 -> 119, so Lasix 40 mg IV was administered. This resulted in good UOP of 2,800 mL and Na has increased to 122 as of this morning.  - Continue 0.9% NS 50 mL/hr continuous IV - Continue monitoring Na closely, with Lasix 40 mg IV prn if it starts to decline. - TSH and cortisol levels normal at 2.25 and 12.6, respectively. - Urine Osm slightly low at 269, Urine Na 98 - Current working differential: chlorthalidone-induced vs SIADH. Would have expected better initial sodium correction once chlorthalidone was discontinued if that was the cause. - Chlorthalidone (homemed) has been discontinued  2. Hypocalcemia - 1g Ca gluconate administered 4/19.  - Corrected Ca in setting of hypoalbuminuria is WNL - Continue to monitor  3. Anemia - Hgb has improved from 6.5 -> 7.4. Continue to monitor.  4. Hypomagnesemia - Serum mag improved to 1.8 with repletion. Continue to monitor  5. Hypokalemia - replete prn  Subjective:   Upon evaluation, Suzanne Fox was A&Ox3 and interactive with provider. She states she feels "ok" and has no complaints. She asked how she was doing medically and seemed pleased to hear she was doing better than when she came in.    Objective:   BP (!) 129/41   Pulse 72   Temp 98.1 F (36.7 C) (Oral)   Resp 16   Ht 5\' 5"  (1.651 m)   Wt 62.2 kg   SpO2 100%   BMI 22.82 kg/m   Physical Exam: Gen: Lying in hospital bed, resting comfortably, in NAD CVS: RRR, no murmurs or rubs Resp: Breathing unlabored, BS clear & equal bilaterally Abd: Soft, non-tender Ext: Warm, dry.   Labs: BMET Recent Labs  Lab 07/13/20 1138 07/13/20 1700 07/13/20 2117 07/14/20 0111 07/14/20 0647 07/14/20 1220 07/14/20 1650 07/14/20 2141 07/14/20 2339 07/15/20 0657 07/15/20 1637 07/15/20 2058 07/16/20 0046 07/16/20 0538  NA 110* 110* 109* 110*  113* 114* 116* 115* 118* 121* 120* 119* 120* 120*  K 5.4*  --  3.1* 4.5 3.0* 3.0*  --   --   --  4.3  --   --   --  4.5  CL 84*  --  78* 80* 80* 82*  --   --   --  90*  --   --   --  90*  CO2 21*  --  25 20* 26 27  --   --   --  26  --   --   --  25  GLUCOSE 83  --  89 71 67* 74  --   --   --  206*  --   --   --  189*  BUN 5*  --  <5* 5* <5* <5*  --   --   --  6*  --   --   --  5*  CREATININE <0.30*  --  <0.30* <0.30* <0.30* <0.30*  --   --   --  0.38*  --   --   --  0.33*  CALCIUM 6.4*  --  7.5* 7.4* 7.4* 7.5*  --   --   --  7.9*  --   --   --  7.8*  PHOS  --  2.4*  --   --  2.6  --  2.4*  --   --  1.4* 3.0  --   --  2.4*  CBC Recent Labs  Lab 07/13/20 1138 07/13/20 1700 07/14/20 0647 07/16/20 0538  WBC 7.1 7.9 6.4 6.3  HGB 6.5* 7.6* 7.9* 7.4*  HCT 20.4* 22.6* 23.4* 23.0*  MCV 82.6 77.9* 78.0* 79.6*  PLT 200 238 243 198      Medications:    . chlorhexidine  15 mL Mouth Rinse BID  . Chlorhexidine Gluconate Cloth  6 each Topical Q0600  . heparin  5,000 Units Subcutaneous Q8H  . insulin aspart  0-15 Units Subcutaneous Q4H  . mouth rinse  15 mL Mouth Rinse q12n4p     Suzanne Fox Suzanne Fox 07/16/2020, 8:11 AM

## 2020-07-16 NOTE — TOC Initial Note (Signed)
Transition of Care [facility]) - Initial/Assessment Note    Patient Details  Name: Suzanne Fox MRN: [id number] Date of Birth: [birth year]  Transition of Care [facility]) CM/SW Contact:    Lockie Pares, RN Phone Number: 07/16/2020, 2:18 PM  Clinical Narrative:                 From SNF significant symptomatic hyponutremia, 110, gave lasix last night with successful diuresis. Is still on tubefeedings, if more awake will convert to oral with supplementation, recommend dietary consult. Will plan to go back to SNF post DCCSW will FU for needs  Expected Discharge Plan: Long Term Nursing Home Barriers to Discharge: Continued Medical Work up   Patient Goals and CMS Choice        Expected Discharge Plan and Services Expected Discharge Plan: Long Term Nursing Home       Living arrangements for the past 2 months: Skilled Nursing Facility                                      Prior Living Arrangements/Services Living arrangements for the past 2 months: Skilled Nursing Facility   Patient language and need for interpreter reviewed:: Yes        Need for Family Participation in Patient Care: Yes (Comment) Care giver support system in place?: Yes (comment)   Criminal Activity/Legal Involvement Pertinent to Current Situation/Hospitalization: No - Comment as needed  Activities of Daily Living      Permission Sought/Granted                  Emotional Assessment         Alcohol / Substance Use: Not Applicable Psych Involvement: No (comment)  Admission diagnosis:  Hypocalcemia [E83.51] Hyponatremia [E87.1] Altered mental status, unspecified altered mental status type [R41.82] Anemia, unspecified type [D64.9] Patient Active Problem List   Diagnosis Date Noted  . Malnutrition of moderate degree 07/14/2020  . Hyponatremia 07/13/2020  . Benign essential HTN 07/13/2020  . Type 2 diabetes mellitus without complication (HCC) 07/13/2020   PCP:  Georgianne Fick,  MD Pharmacy:  No Pharmacies Listed    Social Determinants of Health (SDOH) Interventions    Readmission Risk Interventions No flowsheet data found.

## 2020-07-16 NOTE — Evaluation (Signed)
Physical Therapy Evaluation Patient Details Name: Suzanne Fox MRN: [id number] DOB: May 25, 1929 Today's Date: 07/16/2020   History of Present Illness  87 yo female presents to ED on 4/19 from [facility] with lethargy, AMS, hypotension, hyponatremia. PMH significant for HTN, DM2, dementia, tobacco abuse, PAD, OA, recent PNA nad UTI, and recent admission 3/17-3/25 with spontaneous epidural hematoma requiring C spine decompression and fusion.  Clinical Impression   Pt presents with debility, max difficulty performing bed mobility tasks, impaired sitting balance, poor activity, and confusion although pt with baseline dementia. Pt to benefit from acute PT to address deficits. Pt requiring max assist for moving to and from EOB, tolerating EOB sitting x8 minutes but requires constant posterior truncal support to maintain upright. Pt states she was transfer level to w/c PTA and was working with therapies, unsure of accuracy of this information. PT recommending return to SNF post-acutely. PT to progress mobility as tolerated, and will continue to follow acutely.      Follow Up Recommendations SNF    Equipment Recommendations  None recommended by PT    Recommendations for Other Services       Precautions / Restrictions Precautions Precautions: Fall Restrictions Weight Bearing Restrictions: No      Mobility  Bed Mobility Overal bed mobility: Needs Assistance Bed Mobility: Supine to Sit;Sit to Supine     Supine to sit: Max assist Sit to supine: Max assist   General bed mobility comments: max assist for supine<>sit for trunk and LE management, scooting to/from EOB, and boost up in bed upon return to supine.    Transfers                 General transfer comment: unable, pt fatigued after sitting EOB  Ambulation/Gait                Stairs            Wheelchair Mobility    Modified Rankin (Stroke Patients Only)       Balance Overall balance  assessment: Needs assistance Sitting-balance support: Feet supported;Bilateral upper extremity supported Sitting balance-Leahy Scale: Poor Sitting balance - Comments: requiring min posterior assist to correct posterior bias       Standing balance comment: nt                             Pertinent Vitals/Pain Pain Assessment: Faces Faces Pain Scale: Hurts little more Pain Location: RUE Pain Descriptors / Indicators: Sore;Grimacing;Other (Comment) (swollen) Pain Intervention(s): Limited activity within patient's tolerance;Monitored during session;Repositioned    Home Living Family/patient expects to be discharged to:: Skilled nursing facility                 Additional Comments: [facility]    Prior Function Level of Independence: Needs assistance   Gait / Transfers Assistance Needed: pt reports standing and pivoting to w/c, states "I don't think the therapists were working on walking with me"  ADL's / Homemaking Assistance Needed: Pt reports assist with all ADLs        Hand Dominance   Dominant Hand: Left    Extremity/Trunk Assessment   Upper Extremity Assessment Upper Extremity Assessment: Generalized weakness    Lower Extremity Assessment Lower Extremity Assessment: Generalized weakness    Cervical / Trunk Assessment Cervical / Trunk Assessment: Other exceptions;Kyphotic Cervical / Trunk Exceptions: forward head  Communication   Communication: No difficulties  Cognition Arousal/Alertness: Awake/alert Behavior During Therapy: WFL for tasks  assessed/performed Overall Cognitive Status: History of cognitive impairments - at baseline                                 General Comments: history of dementia; Oriented to self and year but states she is in a nursing home and does not know why she is here. Pt follows commands well, but is an unreliable historian during subjective portion of exam. Pt is very pleasant throughout  session.      General Comments General comments (skin integrity, edema, etc.): SPO2 90% and greater on RA when accurate pleth, HR stable, BP stable from supine>sit    Exercises     Assessment/Plan    PT Assessment Patient needs continued PT services  PT Problem List Decreased strength;Decreased mobility;Decreased safety awareness;Decreased activity tolerance;Decreased cognition;Decreased balance;Decreased knowledge of use of DME       PT Treatment Interventions DME instruction;Therapeutic activities;Gait training;Patient/family education;Therapeutic exercise;Balance training;Functional mobility training;Neuromuscular re-education    PT Goals (Current goals can be found in the Care Plan section)  Acute Rehab PT Goals Patient Stated Goal: none stated PT Goal Formulation: With patient Time For Goal Achievement: 07/30/20 Potential to Achieve Goals: Fair    Frequency Min 2X/week   Barriers to discharge        Co-evaluation               AM-PAC PT "6 Clicks" Mobility  Outcome Measure Help needed turning from your back to your side while in a flat bed without using bedrails?: A Lot Help needed moving from lying on your back to sitting on the side of a flat bed without using bedrails?: A Lot Help needed moving to and from a bed to a chair (including a wheelchair)?: Total Help needed standing up from a chair using your arms (e.g., wheelchair or bedside chair)?: Total Help needed to walk in hospital room?: Total Help needed climbing 3-5 steps with a railing? : Total 6 Click Score: 8    End of Session   Activity Tolerance: Patient tolerated treatment well;Patient limited by fatigue Patient left: in bed;with call bell/phone within reach;with bed alarm set;Other (comment) (with prevalons donned) Nurse Communication: Mobility status PT Visit Diagnosis: Other abnormalities of gait and mobility (R26.89);Muscle weakness (generalized) (M62.81)    Time: 1455-1520 PT Time  Calculation (min) (ACUTE ONLY): 25 min   Charges:   PT Evaluation $PT Eval Low Complexity: 1 Low PT Treatments $Therapeutic Activity: 8-22 mins        Marye Round, PT DPT Acute Rehabilitation Services Pager [phone]  Office [phone]  Truddie Coco 07/16/2020, 4:48 PM

## 2020-07-17 DIAGNOSIS — E871 Hypo-osmolality and hyponatremia: Secondary | ICD-10-CM | POA: Diagnosis not present

## 2020-07-17 LAB — CBC
HCT: 20.5 % — ABNORMAL LOW (ref 36.0–46.0)
Hemoglobin: 6.7 g/dL — CL (ref 12.0–15.0)
MCH: 26.2 pg (ref 26.0–34.0)
MCHC: 32.7 g/dL (ref 30.0–36.0)
MCV: 80.1 fL (ref 80.0–100.0)
Platelets: 172 10*3/uL (ref 150–400)
RBC: 2.56 MIL/uL — ABNORMAL LOW (ref 3.87–5.11)
RDW: 15 % (ref 11.5–15.5)
WBC: 5.5 10*3/uL (ref 4.0–10.5)
nRBC: 0 % (ref 0.0–0.2)

## 2020-07-17 LAB — GLUCOSE, CAPILLARY
Glucose-Capillary: 114 mg/dL — ABNORMAL HIGH (ref 70–99)
Glucose-Capillary: 185 mg/dL — ABNORMAL HIGH (ref 70–99)
Glucose-Capillary: 200 mg/dL — ABNORMAL HIGH (ref 70–99)
Glucose-Capillary: 210 mg/dL — ABNORMAL HIGH (ref 70–99)
Glucose-Capillary: 216 mg/dL — ABNORMAL HIGH (ref 70–99)
Glucose-Capillary: 221 mg/dL — ABNORMAL HIGH (ref 70–99)

## 2020-07-17 LAB — BASIC METABOLIC PANEL
Anion gap: 4 — ABNORMAL LOW (ref 5–15)
BUN: 5 mg/dL — ABNORMAL LOW (ref 8–23)
CO2: 26 mmol/L (ref 22–32)
Calcium: 7.7 mg/dL — ABNORMAL LOW (ref 8.9–10.3)
Chloride: 91 mmol/L — ABNORMAL LOW (ref 98–111)
Creatinine, Ser: <0.3 mg/dL — ABNORMAL LOW (ref 0.44–1.00)
Glucose, Bld: 208 mg/dL — ABNORMAL HIGH (ref 70–99)
Potassium: 4.4 mmol/L (ref 3.5–5.1)
Sodium: 121 mmol/L — ABNORMAL LOW (ref 135–145)

## 2020-07-17 LAB — SODIUM
Sodium: 124 mmol/L — ABNORMAL LOW (ref 135–145)
Sodium: 123 mmol/L — ABNORMAL LOW (ref 135–145)
Sodium: 124 mmol/L — ABNORMAL LOW (ref 135–145)
Sodium: 124 mmol/L — ABNORMAL LOW (ref 135–145)

## 2020-07-17 LAB — ABO/RH: ABO/RH(D): B POS

## 2020-07-17 LAB — PREPARE RBC (CROSSMATCH)

## 2020-07-17 LAB — HEMOGLOBIN AND HEMATOCRIT, BLOOD
HCT: 29.5 % — ABNORMAL LOW (ref 36.0–46.0)
Hemoglobin: 9.8 g/dL — ABNORMAL LOW (ref 12.0–15.0)

## 2020-07-17 MED ORDER — MEMANTINE HCL 10 MG PO TABS
10.0000 mg | ORAL_TABLET | Freq: Every day | ORAL | Status: DC
  Administered 2020-07-18 – 2020-08-06 (×20): 10 mg via ORAL
  Filled 2020-07-17 (×22): qty 1

## 2020-07-17 MED ORDER — FUROSEMIDE 10 MG/ML IJ SOLN
40.0000 mg | Freq: Two times a day (BID) | INTRAMUSCULAR | Status: DC
  Administered 2020-07-17 – 2020-07-21 (×8): 40 mg via INTRAVENOUS
  Filled 2020-07-17 (×8): qty 4

## 2020-07-17 MED ORDER — SODIUM CHLORIDE 0.9% IV SOLUTION: Freq: Once | INTRAVENOUS | Status: AC

## 2020-07-17 MED ORDER — MAGNESIUM SULFATE 2 GM/50ML IV SOLN
2.0000 g | Freq: Once | INTRAVENOUS | Status: AC
  Administered 2020-07-17: 2 g via INTRAVENOUS
  Filled 2020-07-17: qty 50

## 2020-07-17 MED ORDER — INSULIN ASPART 100 UNIT/ML ~~LOC~~ SOLN
0.0000 [IU] | SUBCUTANEOUS | Status: DC
  Administered 2020-07-17: 4 [IU] via SUBCUTANEOUS
  Administered 2020-07-17: 7 [IU] via SUBCUTANEOUS
  Administered 2020-07-18 – 2020-07-19 (×4): 3 [IU] via SUBCUTANEOUS
  Administered 2020-07-19: 7 [IU] via SUBCUTANEOUS
  Administered 2020-07-20: 3 [IU] via SUBCUTANEOUS
  Administered 2020-07-20 (×2): 4 [IU] via SUBCUTANEOUS
  Administered 2020-07-20: 7 [IU] via SUBCUTANEOUS
  Administered 2020-07-20: 15 [IU] via SUBCUTANEOUS
  Administered 2020-07-20 – 2020-07-21 (×3): 4 [IU] via SUBCUTANEOUS
  Administered 2020-07-21 (×2): 7 [IU] via SUBCUTANEOUS
  Administered 2020-07-21: 4 [IU] via SUBCUTANEOUS
  Administered 2020-07-21: 3 [IU] via SUBCUTANEOUS
  Administered 2020-07-21: 7 [IU] via SUBCUTANEOUS
  Administered 2020-07-22 (×2): 4 [IU] via SUBCUTANEOUS
  Administered 2020-07-22: 7 [IU] via SUBCUTANEOUS
  Administered 2020-07-22: 4 [IU] via SUBCUTANEOUS
  Administered 2020-07-22: 7 [IU] via SUBCUTANEOUS
  Administered 2020-07-23 (×3): 4 [IU] via SUBCUTANEOUS
  Administered 2020-07-23: 7 [IU] via SUBCUTANEOUS
  Administered 2020-07-23 – 2020-07-24 (×2): 3 [IU] via SUBCUTANEOUS

## 2020-07-17 MED ORDER — PANTOPRAZOLE SODIUM 40 MG IV SOLR
40.0000 mg | INTRAVENOUS | Status: DC
  Administered 2020-07-17 – 2020-07-23 (×7): 40 mg via INTRAVENOUS
  Filled 2020-07-17 (×7): qty 40

## 2020-07-17 NOTE — Progress Notes (Addendum)
Progress Note    Suzanne Fox  ZOX:[id number] DOB: 01/06/1930  DOA: 07/13/2020 PCP: Georgianne Fickamachandran, Ajith, MD    Brief Narrative:     Medical records reviewed and are as summarized below:  Suzanne Fox is an 87 y.o. female with PMH significant for HTN, DM2, dementia and recent cervical surgery for hematoma [facility].  Na found to be low and is being slowly corrected  Assessment/Plan:   Principal Problem:   Hyponatremia Active Problems:   Benign essential HTN   Type 2 diabetes mellitus without complication (HCC)   Malnutrition of moderate degree   Hyponatremia - is on NaCl 1 gram TID at SNF -Likely 2/2 poor solute intake, chlorthalidone.   -consider SIADH - improving - 3% NaCl off for now -NS - increase amount and add lasix- increase dose Nephrology consulted, appreciate assistance  -diet ordered with fluid restriction  Hypokalemia Supplement as needed  UTI -Mod leukocytosis.   Continue ceftriaxone - stop date placed  Anemia - heme positive Trend cbc   Transfuse per protocol - s/p 1 unit PRBC 4/23 Hgb goal greater than 7 -at this point patient not a candidate for EGD/colonoscopy  Hypothyroidism -Home medications includes Synthroid  -TSH normal  Hypertension -hold HCTZ  Hyperlipidemia -Home meds Lipitor   Diabetes type 2 -SSI  Dementia -Home med include Namenda   +blood cultures with what appears to be a contaminant      Family Communication/Anticipated D/C date and plan/Code Status   DVT prophylaxis: heparin Code Status: DNR  Disposition Plan: Status is: Inpatient Called niece, no answer Remains inpatient appropriate because:Inpatient level of care appropriate due to severity of illness   Dispo: The patient is from: SNF              Anticipated d/c is to: SNF              Patient currently is not medically stable to d/c.   Difficult to place patient No         Medical  Consultants:   neprho   Subjective:   C/o being cold  Objective:    Vitals:   07/17/20 0600 07/17/20 0700 07/17/20 0800 07/17/20 0815  BP: (!) 129/50 (!) 132/43 (!) 136/44 (!) 149/45  Pulse: 68 68 71 80  Resp: 14 14 16 15   Temp:    98.1 F (36.7 C)  TempSrc:    Oral  SpO2: 100% 100% 100% 100%  Weight:      Height:        Intake/Output Summary (Last 24 hours) at 07/17/2020 [id number] Last data filed at 07/17/2020 0815 Gross per 24 hour  Intake 3655.75 ml  Output 5785 ml  Net -2129.25 ml   Filed Weights   07/15/20 0433 07/16/20 0432 07/17/20 0500  Weight: 61.2 kg 62.2 kg 62.2 kg    Exam:  General: Appearance:    elderly female in no acute distress     Lungs:     respirations unlabored  Heart:    Normal heart rate.   MS:   All extremities are intact. +edema in extremities   Neurologic:   Awake, alert, pleasantly confused      Data Reviewed:   I have personally reviewed following labs and imaging studies:  Labs: Labs show the following:   Basic Metabolic Panel: Recent Labs  Lab 07/14/20 0647 07/14/20 1220 07/14/20 1650 07/14/20 2141 07/15/20 0657 07/15/20 1637 07/15/20 2058 07/16/20 0538 07/16/20  7096 07/16/20 1401 07/16/20 1849 07/17/20 0024 07/17/20 0838  NA 113* 114* 116*   < > 121* 120*   < > 120* 122* 122* 123* 121* 124*  K 3.0* 3.0*  --   --  4.3  --   --  4.5  --   --   --  4.4  --   CL 80* 82*  --   --  90*  --   --  90*  --   --   --  91*  --   CO2 26 27  --   --  26  --   --  25  --   --   --  26  --   GLUCOSE 67* 74  --   --  206*  --   --  189*  --   --   --  208*  --   BUN <5* <5*  --   --  6*  --   --  5*  --   --   --  5*  --   CREATININE <0.30* <0.30*  --   --  0.38*  --   --  0.33*  --   --   --  <0.30*  --   CALCIUM 7.4* 7.5*  --   --  7.9*  --   --  7.8*  --   --   --  7.7*  --   MG 1.2*  --  2.1  --  1.9 2.0  --  1.8  --   --   --   --   --   PHOS 2.6  --  2.4*  --  1.4* 3.0  --  2.4*  --   --   --   --   --    < > = values in  this interval not displayed.   GFR CrCl cannot be calculated (This lab value cannot be used to calculate CrCl because it is not a number: <0.30). Liver Function Tests: Recent Labs  Lab 07/13/20 1138  AST 49*  ALT 16  ALKPHOS 55  BILITOT 1.2  PROT 3.2*  ALBUMIN 1.7*   No results for input(s): LIPASE, AMYLASE in the last 168 hours. No results for input(s): AMMONIA in the last 168 hours. Coagulation profile No results for input(s): INR, PROTIME in the last 168 hours.  CBC: Recent Labs  Lab 07/13/20 1138 07/13/20 1700 07/14/20 0647 07/16/20 0538 07/17/20 0024  WBC 7.1 7.9 6.4 6.3 5.5  HGB 6.5* 7.6* 7.9* 7.4* 6.7*  HCT 20.4* 22.6* 23.4* 23.0* 20.5*  MCV 82.6 77.9* 78.0* 79.6* 80.1  PLT 200 238 243 198 172   Cardiac Enzymes: No results for input(s): CKTOTAL, CKMB, CKMBINDEX, TROPONINI in the last 168 hours. BNP (last 3 results) No results for input(s): PROBNP in the last 8760 hours. CBG: Recent Labs  Lab 07/16/20 1529 07/16/20 1919 07/16/20 2326 07/17/20 0408 07/17/20 0756  GLUCAP 229* 189* 221* 185* 210*   D-Dimer: No results for input(s): DDIMER in the last 72 hours. Hgb A1c: No results for input(s): HGBA1C in the last 72 hours. Lipid Profile: No results for input(s): CHOL, HDL, LDLCALC, TRIG, CHOLHDL, LDLDIRECT in the last 72 hours. Thyroid function studies: No results for input(s): TSH, T4TOTAL, T3FREE, THYROIDAB in the last 72 hours.  Invalid input(s): FREET3 Anemia work up: No results for input(s): VITAMINB12, FOLATE, FERRITIN, TIBC, IRON, RETICCTPCT in the last 72 hours. Sepsis Labs: Recent Labs  Lab 07/13/20 1700 07/13/20  2117 07/14/20 0647 07/16/20 0538 07/17/20 0024  PROCALCITON  --  <0.10  --   --   --   WBC 7.9  --  6.4 6.3 5.5  LATICACIDVEN  --  0.8  --   --   --     Microbiology Recent Results (from the past 240 hour(s))  SARS CORONAVIRUS 2 (TAT 6-24 HRS) Nasopharyngeal Nasopharyngeal Swab     Status: None   Collection Time: 07/13/20   3:13 PM   Specimen: Nasopharyngeal Swab  Result Value Ref Range Status   SARS Coronavirus 2 NEGATIVE NEGATIVE Final    Comment: (NOTE) SARS-CoV-2 target nucleic acids are NOT DETECTED.  The SARS-CoV-2 RNA is generally detectable in upper and lower respiratory specimens during the acute phase of infection. Negative results do not preclude SARS-CoV-2 infection, do not rule out co-infections with other pathogens, and should not be used as the sole basis for treatment or other patient management decisions. Negative results must be combined with clinical observations, patient history, and epidemiological information. The expected result is Negative.  Fact Sheet for Patients: HairSlick.no  Fact Sheet for Healthcare Providers: quierodirigir.com  This test is not yet approved or cleared by the [place] FDA and  has been authorized for detection and/or diagnosis of SARS-CoV-2 by FDA under an Emergency Use Authorization (EUA). This EUA will remain  in effect (meaning this test can be used) for the duration of the COVID-19 declaration under Se ction 564(b)(1) of the Act, 21 U.S.C. section 360bbb-3(b)(1), unless the authorization is terminated or revoked sooner.  Performed at [facility], [address], [place], [place] [address]   MRSA PCR Screening     Status: None   Collection Time: 07/13/20  8:43 PM   Specimen: Nasopharyngeal  Result Value Ref Range Status   MRSA by PCR NEGATIVE NEGATIVE Final    Comment:        The GeneXpert MRSA Assay (FDA approved for NASAL specimens only), is one component of a comprehensive MRSA colonization surveillance program. It is not intended to diagnose MRSA infection nor to guide or monitor treatment for MRSA infections. Performed at [facility], [address], [place], [place] [address]   Culture, blood (routine x 2)     Status: Abnormal   Collection Time: 07/13/20   9:14 PM   Specimen: BLOOD  Result Value Ref Range Status   Specimen Description BLOOD BLOOD RIGHT FOREARM  Final   Special Requests   Final    BOTTLES DRAWN AEROBIC ONLY Blood Culture results may not be optimal due to an inadequate volume of blood received in culture bottles   Culture  Setup Time   Final    GRAM POSITIVE COCCI IN CLUSTERS AEROBIC BOTTLE ONLY CRITICAL RESULT CALLED TO, READ BACK BY AND VERIFIED WITH: Peter Minium PHARMD 1252 07/15/20 A BROWNING    Culture (A)  Final    STAPHYLOCOCCUS EPIDERMIDIS THE SIGNIFICANCE OF ISOLATING THIS ORGANISM FROM A SINGLE SET OF BLOOD CULTURES WHEN MULTIPLE SETS ARE DRAWN IS UNCERTAIN. PLEASE NOTIFY THE MICROBIOLOGY DEPARTMENT WITHIN ONE WEEK IF SPECIATION AND SENSITIVITIES ARE REQUIRED. Performed at [facility], [address], [place], [place] [address]    Report Status 07/16/2020 FINAL  Final  Blood Culture ID Panel (Reflexed)     Status: Abnormal   Collection Time: 07/13/20  9:14 PM  Result Value Ref Range Status   Enterococcus faecalis NOT DETECTED NOT DETECTED Final   Enterococcus Faecium NOT DETECTED NOT DETECTED  Final   Listeria monocytogenes NOT DETECTED NOT DETECTED Final   Staphylococcus species DETECTED (A) NOT DETECTED Final    Comment: CRITICAL RESULT CALLED TO, READ BACK BY AND VERIFIED WITH: Peter Minium PHARMD 1252 07/15/20 A BROWNING    Staphylococcus aureus (BCID) NOT DETECTED NOT DETECTED Final   Staphylococcus epidermidis DETECTED (A) NOT DETECTED Final    Comment: Methicillin (oxacillin) resistant coagulase negative staphylococcus. Possible blood culture contaminant (unless isolated from more than one blood culture draw or clinical case suggests pathogenicity). No antibiotic treatment is indicated for blood  culture contaminants. CRITICAL RESULT CALLED TO, READ BACK BY AND VERIFIED WITH: Peter Minium PHARMD 1252 07/15/20 A BROWNING    Staphylococcus lugdunensis NOT DETECTED NOT DETECTED Final   Streptococcus species NOT  DETECTED NOT DETECTED Final   Streptococcus agalactiae NOT DETECTED NOT DETECTED Final   Streptococcus pneumoniae NOT DETECTED NOT DETECTED Final   Streptococcus pyogenes NOT DETECTED NOT DETECTED Final   A.calcoaceticus-baumannii NOT DETECTED NOT DETECTED Final   Bacteroides fragilis NOT DETECTED NOT DETECTED Final   Enterobacterales NOT DETECTED NOT DETECTED Final   Enterobacter cloacae complex NOT DETECTED NOT DETECTED Final   Escherichia coli NOT DETECTED NOT DETECTED Final   Klebsiella aerogenes NOT DETECTED NOT DETECTED Final   Klebsiella oxytoca NOT DETECTED NOT DETECTED Final   Klebsiella pneumoniae NOT DETECTED NOT DETECTED Final   Proteus species NOT DETECTED NOT DETECTED Final   Salmonella species NOT DETECTED NOT DETECTED Final   Serratia marcescens NOT DETECTED NOT DETECTED Final   Haemophilus influenzae NOT DETECTED NOT DETECTED Final   Neisseria meningitidis NOT DETECTED NOT DETECTED Final   Pseudomonas aeruginosa NOT DETECTED NOT DETECTED Final   Stenotrophomonas maltophilia NOT DETECTED NOT DETECTED Final   Candida albicans NOT DETECTED NOT DETECTED Final   Candida auris NOT DETECTED NOT DETECTED Final   Candida glabrata NOT DETECTED NOT DETECTED Final   Candida krusei NOT DETECTED NOT DETECTED Final   Candida parapsilosis NOT DETECTED NOT DETECTED Final   Candida tropicalis NOT DETECTED NOT DETECTED Final   Cryptococcus neoformans/gattii NOT DETECTED NOT DETECTED Final   Methicillin resistance mecA/C DETECTED (A) NOT DETECTED Final    Comment: CRITICAL RESULT CALLED TO, READ BACK BY AND VERIFIED WITHPeter Minium PHARMD 1252 07/15/20 A BROWNING Performed at [facility], [address], [place], [place] [address]   Culture, Urine     Status: None   Collection Time: 07/13/20  9:20 PM   Specimen: Urine, Random  Result Value Ref Range Status   Specimen Description URINE, RANDOM  Final   Special Requests NONE  Final   Culture   Final    NO GROWTH Performed at  [facility], [address] [place], [place] [address]    Report Status 07/15/2020 FINAL  Final  Culture, blood (routine x 2)     Status: None (Preliminary result)   Collection Time: 07/13/20  9:23 PM   Specimen: BLOOD  Result Value Ref Range Status   Specimen Description BLOOD RIGHT ANTECUBITAL  Final   Special Requests   Final    BOTTLES DRAWN AEROBIC ONLY Blood Culture results may not be optimal due to an inadequate volume of blood received in culture bottles   Culture  Setup Time   Final    AEROBIC BOTTLE ONLY GRAM POSITIVE RODS CRITICAL RESULT CALLED TO, READ BACK BY AND VERIFIED WITH: G ABBOTT PHARMD 07/16/20 0442 JDW    Culture   Final    CULTURE REINCUBATED  FOR BETTER GROWTH Performed at [facility], [address], [place], [place] [address]    Report Status PENDING  Incomplete    Procedures and diagnostic studies:  No results found.  Medications:   . chlorhexidine  15 mL Mouth Rinse BID  . Chlorhexidine Gluconate Cloth  6 each Topical Q0600  . furosemide  40 mg Intravenous Q12H  . heparin  5,000 Units Subcutaneous Q8H  . insulin aspart  0-15 Units Subcutaneous Q4H  . levothyroxine  25 mcg Oral Q0600  . mouth rinse  15 mL Mouth Rinse q12n4p  . memantine  10 mg Per Tube Daily  . pantoprazole (PROTONIX) IV  40 mg Intravenous Q24H   Continuous Infusions: . sodium chloride 50 mL/hr at 07/15/20 1036  . sodium chloride 100 mL/hr at 07/17/20 0800  . cefTRIAXone (ROCEPHIN)  IV Stopped (07/16/20 2240)  . feeding supplement (OSMOLITE 1.2 CAL) 1,000 mL (07/14/20 1209)     LOS: 4 days   Joseph Art  [facility]   How to contact the [facility] Attending or Consulting provider 7A - 7P or covering provider during after hours 7P -7A, for this patient?  1. Check the care team in [facility] and look for a) attending/consulting TR[facility] provider listed and b) the [facility] team listed 2. Log into [email or web address] and use [place]'s universal password to access. If you do not  have the password, please contact the hospital operator. 3. Locate the [facility] provider you are looking for under [facility] and page to a number that you can be directly reached. 4. If you still have difficulty reaching the provider, please page the [facility] (Director on Call) for the Hospitalists listed on amion for assistance.  07/17/2020, 9:52 AM

## 2020-07-17 NOTE — Progress Notes (Addendum)
Pt put on DYS 3 diet per recommendations from SLP. Family has requested that pt be given pureed foods instead. Pt's diet switched to DYS 1.

## 2020-07-17 NOTE — Evaluation (Signed)
Clinical/Bedside Swallow Evaluation Patient Details  Name: JAMESHA ELLSWORTH MRN: [id number] Date of Birth: 1930/01/21  Today's Date: 07/17/2020 Time: SLP Start Time (ACUTE ONLY): 0910 SLP Stop Time (ACUTE ONLY): 0918 SLP Time Calculation (min) (ACUTE ONLY): 8 min  Past Medical History: History reviewed. No pertinent past medical history. Past Surgical History: History reviewed. No pertinent surgical history. HPI:  MERCEDES FORT is an 87 y.o. female with PMH significant for HTN, DM2, dementia and recent cervical surgery for hematoma who lives at [facility] healthcare is brought in for somnolence. Kept NPO due to AMS.   Assessment / Plan / Recommendation Clinical Impression  Pt demonstrates normal ability to masticate with gums (wears dentures but not always when eating soft foods) and drink liquids without signs of aspiration. Pt is alert and relatively good historian. Recommend initiating a mech soft diet and thin liquids. OK to downgrade solids to Dys 2 ground if needed. Pt does not need further SLP interventions. Will sign off. SLP Visit Diagnosis: Dysphagia, unspecified (R13.10)    Aspiration Risk  Mild aspiration risk    Diet Recommendation Dysphagia 3 (Mech soft);Thin liquid   Liquid Administration via: Cup;Straw Medication Administration: Whole meds with liquid Supervision: Staff to assist with self feeding    Other  Recommendations     Follow up Recommendations    none    Frequency and Duration            Prognosis        Swallow Study   General HPI: MADYSUN THALL is an 87 y.o. female with PMH significant for HTN, DM2, dementia and recent cervical surgery for hematoma who lives at [facility] healthcare is brought in for somnolence. Kept NPO due to AMS. Type of Study: Bedside Swallow Evaluation Previous Swallow Assessment: none Diet Prior to this Study: NPO;NG Tube Temperature Spikes Noted: No Respiratory Status: Room air History of Recent Intubation:  No Behavior/Cognition: Alert;Cooperative;Pleasant mood Oral Cavity Assessment: Within Functional Limits Oral Care Completed by SLP: No Oral Cavity - Dentition: Edentulous;Dentures, not available Vision: Functional for self-feeding Self-Feeding Abilities: Needs assist Patient Positioning: Upright in bed Baseline Vocal Quality: Normal Volitional Cough: Strong Volitional Swallow: Able to elicit    Oral/Motor/Sensory Function Overall Oral Motor/Sensory Function: Within functional limits   Ice Chips     Thin Liquid Thin Liquid: Within functional limits Presentation: Straw    Nectar Thick Nectar Thick Liquid: Not tested   Honey Thick Honey Thick Liquid: Not tested   Puree Puree: Within functional limits   Solid     Solid: Within functional limits     Harlon Ditty, MA CCC-SLP  Acute Rehabilitation Services Pager [phone] Office [phone]  Claudine Mouton 07/17/2020,9:20 AM

## 2020-07-17 NOTE — Progress Notes (Signed)
HOSPITAL MEDICINE OVERNIGHT EVENT NOTE    Notified that patient's hemoglobin has dropped this morning from 7.4 yesterday to 6.7 this morning.  Patient denies chest pain or shortness of breath.  Patient is hemodynamically stable.  Patient exhibits no evidence of active bleeding.  Orders placed for 1 unit packed red blood cell transfusion.  Continuing to monitor patient closely.  Marinda Elk  MD [facility]

## 2020-07-17 NOTE — Progress Notes (Signed)
[facility] ROUNDING NOTE   Subjective:   Interval History: This 87 year old lady with past medical history hypertension diabetes dementia lives in a nursing home.  She was seen for hyponatremia with a serum sodium level dropped down to 110.  This was in conjunction with use of a thiazide diuretic.  Labs were consistent with use of thiazide diuretic.  Is possibility that she   may have SIADH.  Her serum sodium is improved to 121.  We will continue to follow her with the use of IV saline and IV diuretics.  I will anticipate improvement.  I would continue free water restriction.  Objective:  Vital signs in last 24 hours:  Temp:  [97.6 F (36.4 C)-98.1 F (36.7 C)] 98.1 F (36.7 C) (04/23 0815) Pulse Rate:  [61-84] 80 (04/23 0815) Resp:  [12-18] 15 (04/23 0815) BP: (117-158)/(39-73) 149/45 (04/23 0815) SpO2:  [94 %-100 %] 100 % (04/23 0815) Weight:  [62.2 kg] 62.2 kg (04/23 0500)  Weight change: 0 kg Filed Weights   07/15/20 0433 07/16/20 0432 07/17/20 0500  Weight: 61.2 kg 62.2 kg 62.2 kg    Intake/Output: I/O last 3 completed shifts: In: 4567.4 [I.V.:2552.4; NG/GT:1815; IV Piggyback:200] Out: 6910 [Urine:6910]   Intake/Output this shift:  Total I/O In: 623.9 [I.V.:199.9; Blood:314; NG/GT:110] Out: 1500 [Urine:1500]  KVQ:QVZDG in hospital bed, resting comfortably, in NAD CVS:RRR, no murmurs or rubs Resp:Breathing unlabored, BS clear & equal bilaterally LOV:FIEP, non-tender PIR:JJOA, dry.    Basic Metabolic Panel: Recent Labs  Lab 07/14/20 0647 07/14/20 1220 07/14/20 1650 07/14/20 2141 07/15/20 0657 07/15/20 1637 07/15/20 2058 07/16/20 0538 07/16/20 0926 07/16/20 1401 07/16/20 1849 07/17/20 0024  NA 113* 114* 116*   < > 121* 120*   < > 120* 122* 122* 123* 121*  K 3.0* 3.0*  --   --  4.3  --   --  4.5  --   --   --  4.4  CL 80* 82*  --   --  90*  --   --  90*  --   --   --  91*  CO2 26 27  --   --  26  --   --  25  --   --   --  26   GLUCOSE 67* 74  --   --  206*  --   --  189*  --   --   --  208*  BUN <5* <5*  --   --  6*  --   --  5*  --   --   --  5*  CREATININE <0.30* <0.30*  --   --  0.38*  --   --  0.33*  --   --   --  <0.30*  CALCIUM 7.4* 7.5*  --   --  7.9*  --   --  7.8*  --   --   --  7.7*  MG 1.2*  --  2.1  --  1.9 2.0  --  1.8  --   --   --   --   PHOS 2.6  --  2.4*  --  1.4* 3.0  --  2.4*  --   --   --   --    < > = values in this interval not displayed.    Liver Function Tests: Recent Labs  Lab 07/13/20 1138  AST 49*  ALT 16  ALKPHOS 55  BILITOT 1.2  PROT 3.2*  ALBUMIN 1.7*  No results for input(s): LIPASE, AMYLASE in the last 168 hours. No results for input(s): AMMONIA in the last 168 hours.  CBC: Recent Labs  Lab 07/13/20 1138 07/13/20 1700 07/14/20 0647 07/16/20 0538 07/17/20 0024  WBC 7.1 7.9 6.4 6.3 5.5  HGB 6.5* 7.6* 7.9* 7.4* 6.7*  HCT 20.4* 22.6* 23.4* 23.0* 20.5*  MCV 82.6 77.9* 78.0* 79.6* 80.1  PLT 200 238 243 198 172    Cardiac Enzymes: No results for input(s): CKTOTAL, CKMB, CKMBINDEX, TROPONINI in the last 168 hours.  BNP: Invalid input(s): POCBNP  CBG: Recent Labs  Lab 07/16/20 1529 07/16/20 1919 07/16/20 2326 07/17/20 0408 07/17/20 0756  GLUCAP 229* 189* 221* 185* 210*    Microbiology: Results for orders placed or performed during the hospital encounter of 07/13/20  SARS CORONAVIRUS 2 (TAT 6-24 HRS) Nasopharyngeal Nasopharyngeal Swab     Status: None   Collection Time: 07/13/20  3:13 PM   Specimen: Nasopharyngeal Swab  Result Value Ref Range Status   SARS Coronavirus 2 NEGATIVE NEGATIVE Final    Comment: (NOTE) SARS-CoV-2 target nucleic acids are NOT DETECTED.  The SARS-CoV-2 RNA is generally detectable in upper and lower respiratory specimens during the acute phase of infection. Negative results do not preclude SARS-CoV-2 infection, do not rule out co-infections with other pathogens, and should not be used as the sole basis for treatment or  other patient management decisions. Negative results must be combined with clinical observations, patient history, and epidemiological information. The expected result is Negative.  Fact Sheet for Patients: HairSlick.no  Fact Sheet for Healthcare Providers: quierodirigir.com  This test is not yet approved or cleared by the [place] FDA and  has been authorized for detection and/or diagnosis of SARS-CoV-2 by FDA under an Emergency Use Authorization (EUA). This EUA will remain  in effect (meaning this test can be used) for the duration of the COVID-19 declaration under Se ction 564(b)(1) of the Act, 21 U.S.C. section 360bbb-3(b)(1), unless the authorization is terminated or revoked sooner.  Performed at [facility], [address], [place], [place] [address]   MRSA PCR Screening     Status: None   Collection Time: 07/13/20  8:43 PM   Specimen: Nasopharyngeal  Result Value Ref Range Status   MRSA by PCR NEGATIVE NEGATIVE Final    Comment:        The GeneXpert MRSA Assay (FDA approved for NASAL specimens only), is one component of a comprehensive MRSA colonization surveillance program. It is not intended to diagnose MRSA infection nor to guide or monitor treatment for MRSA infections. Performed at [facility], [address], [place], [place] [address]   Culture, blood (routine x 2)     Status: Abnormal   Collection Time: 07/13/20  9:14 PM   Specimen: BLOOD  Result Value Ref Range Status   Specimen Description BLOOD BLOOD RIGHT FOREARM  Final   Special Requests   Final    BOTTLES DRAWN AEROBIC ONLY Blood Culture results may not be optimal due to an inadequate volume of blood received in culture bottles   Culture  Setup Time   Final    GRAM POSITIVE COCCI IN CLUSTERS AEROBIC BOTTLE ONLY CRITICAL RESULT CALLED TO, READ BACK BY AND VERIFIED WITH: Peter Minium PHARMD 1252 07/15/20 A BROWNING    Culture (A)   Final    STAPHYLOCOCCUS EPIDERMIDIS THE SIGNIFICANCE OF ISOLATING THIS ORGANISM FROM A SINGLE SET OF BLOOD CULTURES WHEN MULTIPLE SETS ARE DRAWN IS UNCERTAIN. PLEASE NOTIFY  THE MICROBIOLOGY DEPARTMENT WITHIN ONE WEEK IF SPECIATION AND SENSITIVITIES ARE REQUIRED. Performed at [facility], [address] [place], [place] [address]    Report Status 07/16/2020 FINAL  Final  Blood Culture ID Panel (Reflexed)     Status: Abnormal   Collection Time: 07/13/20  9:14 PM  Result Value Ref Range Status   Enterococcus faecalis NOT DETECTED NOT DETECTED Final   Enterococcus Faecium NOT DETECTED NOT DETECTED Final   Listeria monocytogenes NOT DETECTED NOT DETECTED Final   Staphylococcus species DETECTED (A) NOT DETECTED Final    Comment: CRITICAL RESULT CALLED TO, READ BACK BY AND VERIFIED WITH: Peter MiniumJ FRENS PHARMD 1252 07/15/20 A BROWNING    Staphylococcus aureus (BCID) NOT DETECTED NOT DETECTED Final   Staphylococcus epidermidis DETECTED (A) NOT DETECTED Final    Comment: Methicillin (oxacillin) resistant coagulase negative staphylococcus. Possible blood culture contaminant (unless isolated from more than one blood culture draw or clinical case suggests pathogenicity). No antibiotic treatment is indicated for blood  culture contaminants. CRITICAL RESULT CALLED TO, READ BACK BY AND VERIFIED WITH: Peter MiniumJ FRENS PHARMD 1252 07/15/20 A BROWNING    Staphylococcus lugdunensis NOT DETECTED NOT DETECTED Final   Streptococcus species NOT DETECTED NOT DETECTED Final   Streptococcus agalactiae NOT DETECTED NOT DETECTED Final   Streptococcus pneumoniae NOT DETECTED NOT DETECTED Final   Streptococcus pyogenes NOT DETECTED NOT DETECTED Final   A.calcoaceticus-baumannii NOT DETECTED NOT DETECTED Final   Bacteroides fragilis NOT DETECTED NOT DETECTED Final   Enterobacterales NOT DETECTED NOT DETECTED Final   Enterobacter cloacae complex NOT DETECTED NOT DETECTED Final   Escherichia coli NOT DETECTED NOT DETECTED Final    Klebsiella aerogenes NOT DETECTED NOT DETECTED Final   Klebsiella oxytoca NOT DETECTED NOT DETECTED Final   Klebsiella pneumoniae NOT DETECTED NOT DETECTED Final   Proteus species NOT DETECTED NOT DETECTED Final   Salmonella species NOT DETECTED NOT DETECTED Final   Serratia marcescens NOT DETECTED NOT DETECTED Final   Haemophilus influenzae NOT DETECTED NOT DETECTED Final   Neisseria meningitidis NOT DETECTED NOT DETECTED Final   Pseudomonas aeruginosa NOT DETECTED NOT DETECTED Final   Stenotrophomonas maltophilia NOT DETECTED NOT DETECTED Final   Candida albicans NOT DETECTED NOT DETECTED Final   Candida auris NOT DETECTED NOT DETECTED Final   Candida glabrata NOT DETECTED NOT DETECTED Final   Candida krusei NOT DETECTED NOT DETECTED Final   Candida parapsilosis NOT DETECTED NOT DETECTED Final   Candida tropicalis NOT DETECTED NOT DETECTED Final   Cryptococcus neoformans/gattii NOT DETECTED NOT DETECTED Final   Methicillin resistance mecA/C DETECTED (A) NOT DETECTED Final    Comment: CRITICAL RESULT CALLED TO, READ BACK BY AND VERIFIED WITHPeter Minium: J FRENS PHARMD 1252 07/15/20 A BROWNING Performed at [facility], [address] [place], [place] [address]   Culture, Urine     Status: None   Collection Time: 07/13/20  9:20 PM   Specimen: Urine, Random  Result Value Ref Range Status   Specimen Description URINE, RANDOM  Final   Special Requests NONE  Final   Culture   Final    NO GROWTH Performed at [facility], [address] [place], [place] [address]    Report Status 07/15/2020 FINAL  Final  Culture, blood (routine x 2)     Status: None (Preliminary result)   Collection Time: 07/13/20  9:23 PM   Specimen: BLOOD  Result Value Ref Range Status   Specimen Description BLOOD RIGHT ANTECUBITAL  Final  Special Requests   Final    BOTTLES DRAWN AEROBIC ONLY Blood Culture results may not be optimal due to an inadequate volume of blood received in culture bottles   Culture   Setup Time   Final    AEROBIC BOTTLE ONLY GRAM POSITIVE RODS CRITICAL RESULT CALLED TO, READ BACK BY AND VERIFIED WITH: G ABBOTT PHARMD 07/16/20 0442 JDW    Culture   Final    NO GROWTH 4 DAYS Performed at [facility], [address], [place], [place] [address]    Report Status PENDING  Incomplete    Coagulation Studies: No results for input(s): LABPROT, INR in the last 72 hours.  Urinalysis: No results for input(s): COLORURINE, LABSPEC, PHURINE, GLUCOSEU, HGBUR, BILIRUBINUR, KETONESUR, PROTEINUR, UROBILINOGEN, NITRITE, LEUKOCYTESUR in the last 72 hours.  Invalid input(s): APPERANCEUR    Imaging: No results found.   Medications:   . sodium chloride 50 mL/hr at 07/15/20 1036  . sodium chloride 100 mL/hr at 07/17/20 0800  . cefTRIAXone (ROCEPHIN)  IV Stopped (07/16/20 2240)  . feeding supplement (OSMOLITE 1.2 CAL) 1,000 mL (07/14/20 1209)   . chlorhexidine  15 mL Mouth Rinse BID  . Chlorhexidine Gluconate Cloth  6 each Topical Q0600  . furosemide  40 mg Intravenous Q12H  . heparin  5,000 Units Subcutaneous Q8H  . insulin aspart  0-15 Units Subcutaneous Q4H  . levothyroxine  25 mcg Oral Q0600  . mouth rinse  15 mL Mouth Rinse q12n4p  . memantine  10 mg Per Tube Daily   sodium chloride  Assessment/ Plan:  1.Significant hyponatremia Urine osmolality 269 urine sodium 98 07/13/2020.  Outpatient use of chlorthalidone this has been discontinued.  Serum sodium was continue to improve requiring 3% saline now receiving normal saline at 100 cc an hour.  Continues on Lasix 40 mg twice daily.  We will continue to follow.  Has been slow gradual improvement in sodium.  2. Hypocalcemia - 1g Ca gluconate administered 4/19.  - Corrected Ca in setting of hypoalbuminuria is WNL - Continue to monitor  3. Anemia - Hgb has improved from 6.5 -> 7.4. Continue to monitor.  4. Hypomagnesemia - Serum magimproved to 1.8 with repletion. Continue to monitor  5. Hypokalemia -  replete prn   LOS: 4 Garnetta Buddy @TODAY @8 :38 AM

## 2020-07-18 DIAGNOSIS — E871 Hypo-osmolality and hyponatremia: Secondary | ICD-10-CM | POA: Diagnosis not present

## 2020-07-18 LAB — GLUCOSE, CAPILLARY
Glucose-Capillary: 103 mg/dL — ABNORMAL HIGH (ref 70–99)
Glucose-Capillary: 104 mg/dL — ABNORMAL HIGH (ref 70–99)
Glucose-Capillary: 142 mg/dL — ABNORMAL HIGH (ref 70–99)
Glucose-Capillary: 140 mg/dL — ABNORMAL HIGH (ref 70–99)
Glucose-Capillary: 123 mg/dL — ABNORMAL HIGH (ref 70–99)

## 2020-07-18 LAB — CBC
HCT: 28.9 % — ABNORMAL LOW (ref 36.0–46.0)
Hemoglobin: 9.2 g/dL — ABNORMAL LOW (ref 12.0–15.0)
MCH: 26 pg (ref 26.0–34.0)
MCHC: 31.8 g/dL (ref 30.0–36.0)
MCV: 81.6 fL (ref 80.0–100.0)
Platelets: 169 10*3/uL (ref 150–400)
RBC: 3.54 MIL/uL — ABNORMAL LOW (ref 3.87–5.11)
RDW: 15.3 % (ref 11.5–15.5)
WBC: 7.2 10*3/uL (ref 4.0–10.5)
nRBC: 0 % (ref 0.0–0.2)

## 2020-07-18 LAB — CULTURE, BLOOD (ROUTINE X 2)

## 2020-07-18 LAB — BASIC METABOLIC PANEL
Anion gap: 7 (ref 5–15)
BUN: 5 mg/dL — ABNORMAL LOW (ref 8–23)
CO2: 25 mmol/L (ref 22–32)
Calcium: 8.3 mg/dL — ABNORMAL LOW (ref 8.9–10.3)
Chloride: 94 mmol/L — ABNORMAL LOW (ref 98–111)
Creatinine, Ser: 0.31 mg/dL — ABNORMAL LOW (ref 0.44–1.00)
GFR, Estimated: >60 mL/min (ref >60)
Glucose, Bld: 107 mg/dL — ABNORMAL HIGH (ref 70–99)
Potassium: 4.2 mmol/L (ref 3.5–5.1)
Sodium: 126 mmol/L — ABNORMAL LOW (ref 135–145)

## 2020-07-18 LAB — TYPE AND SCREEN
ABO/RH(D): B POS
Antibody Screen: NEGATIVE
Unit division: 0

## 2020-07-18 LAB — SODIUM
Sodium: 125 mmol/L — ABNORMAL LOW (ref 135–145)
Sodium: 126 mmol/L — ABNORMAL LOW (ref 135–145)

## 2020-07-18 LAB — BPAM RBC
Blood Product Expiration Date: 202205112359
ISSUE DATE / TIME: 202204230529
Unit Type and Rh: 7300

## 2020-07-18 NOTE — Plan of Care (Signed)

## 2020-07-18 NOTE — Progress Notes (Signed)
Progress Note    Suzanne Fox  RFF:[id number] DOB: 1929/12/18  DOA: 07/13/2020 PCP: Georgianne Fick, MD    Brief Narrative:     Medical records reviewed and are as summarized below:  Suzanne Fox is an 87 y.o. female with PMH significant for HTN, DM2, dementia and recent cervical surgery for hematoma who lives at [facility] healthcare is brought in for somnolence.  Na found to be low and is being slowly corrected.    Assessment/Plan:   Principal Problem:   Hyponatremia Active Problems:   Benign essential HTN   Type 2 diabetes mellitus without complication (HCC)   Malnutrition of moderate degree   Hyponatremia - is on NaCl 1 gram TID at SNF -Likely 2/2 poor solute intake, chlorthalidone.   -consider SIADH - improving - 3% NaCl off for now -NS - increase amount and add lasix Nephrology consulted, appreciate assistance  -diet ordered with fluid restriction  Hypokalemia Supplement as needed  UTI -Mod leukocytosis.   Continue ceftriaxone - stop date placed  Anemia - heme positive Trend cbc   Transfuse per protocol - s/p 1 unit PRBC 4/23 Hgb goal greater than 7 -at this point patient not a candidate for EGD/colonoscopy  Hypothyroidism -Home medications includes Synthroid  -TSH normal  Hypertension -hold HCTZ  Hyperlipidemia -Home meds Lipitor   Diabetes type 2 -SSI  Dementia -Home med include Namenda   +blood cultures with what appears to be a contaminant      Family Communication/Anticipated D/C date and plan/Code Status   DVT prophylaxis: heparin Code Status: DNR  Disposition Plan: Status is: Inpatient Spoke ith niece 4/23 Remains inpatient appropriate because:Inpatient level of care appropriate due to severity of illness   Dispo: The patient is from: SNF              Anticipated d/c is to: SNF              Patient currently is not medically stable to d/c.   Difficult to place patient No         Medical Consultants:    neprho   Subjective:   No SOB, no CP  Objective:    Vitals:   07/18/20 0354 07/18/20 0400 07/18/20 0733 07/18/20 1139  BP: (!) 141/81  (!) 134/53 140/63  Pulse: 84  68 73  Resp: 15  13 18   Temp: 97.9 F (36.6 C)  97.9 F (36.6 C) 97.9 F (36.6 C)  TempSrc: Oral  Axillary Axillary  SpO2: 98%  99% 100%  Weight:  62.3 kg    Height:        Intake/Output Summary (Last 24 hours) at 07/18/2020 1204 Last data filed at 07/18/2020 0830 Gross per 24 hour  Intake 799.28 ml  Output 4075 ml  Net -3275.72 ml   Filed Weights   07/16/20 0432 07/17/20 0500 07/18/20 0400  Weight: 62.2 kg 62.2 kg 62.3 kg    Exam:  General: Appearance:    elderly female in no acute distress     Lungs:      respirations unlabored  Heart:    Normal heart rate. Normal rhythm. No murmurs, rubs, or gallops.   MS:   All extremities are intact.   Neurologic:   Awake, alert, oriented to person           Data Reviewed:   I have personally reviewed following labs and imaging studies:  Labs: Labs show the following:   Basic Metabolic Panel: Recent Labs  Lab  07/14/20 0647 07/14/20 1220 07/14/20 1650 07/14/20 2141 07/15/20 0657 07/15/20 1637 07/15/20 2058 07/16/20 0538 07/16/20 0926 07/17/20 0024 07/17/20 2637 07/17/20 1613 07/17/20 2041 07/18/20 0036 07/18/20 0439 07/18/20 0755  NA 113* 114* 116*   < > 121* 120*   < > 120*   < > 121*   < > 124* 124* 125* 126* 126*  K 3.0* 3.0*  --   --  4.3  --   --  4.5  --  4.4  --   --   --   --  4.2  --   CL 80* 82*  --   --  90*  --   --  90*  --  91*  --   --   --   --  94*  --   CO2 26 27  --   --  26  --   --  25  --  26  --   --   --   --  25  --   GLUCOSE 67* 74  --   --  206*  --   --  189*  --  208*  --   --   --   --  107*  --   BUN <5* <5*  --   --  6*  --   --  5*  --  5*  --   --   --   --  5*  --   CREATININE <0.30* <0.30*  --   --  0.38*  --   --  0.33*  --  <0.30*  --   --   --   --  0.31*  --   CALCIUM 7.4* 7.5*  --   --  7.9*   --   --  7.8*  --  7.7*  --   --   --   --  8.3*  --   MG 1.2*  --  2.1  --  1.9 2.0  --  1.8  --   --   --   --   --   --   --   --   PHOS 2.6  --  2.4*  --  1.4* 3.0  --  2.4*  --   --   --   --   --   --   --   --    < > = values in this interval not displayed.   GFR Estimated Creatinine Clearance: 43.7 mL/min (A) (by C-G formula based on SCr of 0.31 mg/dL (L)). Liver Function Tests: Recent Labs  Lab 07/13/20 1138  AST 49*  ALT 16  ALKPHOS 55  BILITOT 1.2  PROT 3.2*  ALBUMIN 1.7*   No results for input(s): LIPASE, AMYLASE in the last 168 hours. No results for input(s): AMMONIA in the last 168 hours. Coagulation profile No results for input(s): INR, PROTIME in the last 168 hours.  CBC: Recent Labs  Lab 07/13/20 1700 07/14/20 0647 07/16/20 0538 07/17/20 0024 07/17/20 1613 07/18/20 0439  WBC 7.9 6.4 6.3 5.5  --  7.2  HGB 7.6* 7.9* 7.4* 6.7* 9.8* 9.2*  HCT 22.6* 23.4* 23.0* 20.5* 29.5* 28.9*  MCV 77.9* 78.0* 79.6* 80.1  --  81.6  PLT 238 243 198 172  --  169   Cardiac Enzymes: No results for input(s): CKTOTAL, CKMB, CKMBINDEX, TROPONINI in the last 168 hours. BNP (last 3 results) No results for input(s): PROBNP in the last 8760 hours. CBG: Recent Labs  Lab 07/17/20 2017 07/17/20 2345 07/18/20 0401 07/18/20 0730 07/18/20 1144  GLUCAP 200* 114* 103* 104* 142*   D-Dimer: No results for input(s): DDIMER in the last 72 hours. Hgb A1c: No results for input(s): HGBA1C in the last 72 hours. Lipid Profile: No results for input(s): CHOL, HDL, LDLCALC, TRIG, CHOLHDL, LDLDIRECT in the last 72 hours. Thyroid function studies: No results for input(s): TSH, T4TOTAL, T3FREE, THYROIDAB in the last 72 hours.  Invalid input(s): FREET3 Anemia work up: No results for input(s): VITAMINB12, FOLATE, FERRITIN, TIBC, IRON, RETICCTPCT in the last 72 hours. Sepsis Labs: Recent Labs  Lab 07/13/20 2117 07/14/20 0647 07/16/20 0538 07/17/20 0024 07/18/20 0439  PROCALCITON <0.10   --   --   --   --   WBC  --  6.4 6.3 5.5 7.2  LATICACIDVEN 0.8  --   --   --   --     Microbiology Recent Results (from the past 240 hour(s))  SARS CORONAVIRUS 2 (TAT 6-24 HRS) Nasopharyngeal Nasopharyngeal Swab     Status: None   Collection Time: 07/13/20  3:13 PM   Specimen: Nasopharyngeal Swab  Result Value Ref Range Status   SARS Coronavirus 2 NEGATIVE NEGATIVE Final    Comment: (NOTE) SARS-CoV-2 target nucleic acids are NOT DETECTED.  The SARS-CoV-2 RNA is generally detectable in upper and lower respiratory specimens during the acute phase of infection. Negative results do not preclude SARS-CoV-2 infection, do not rule out co-infections with other pathogens, and should not be used as the sole basis for treatment or other patient management decisions. Negative results must be combined with clinical observations, patient history, and epidemiological information. The expected result is Negative.  Fact Sheet for Patients: HairSlick.no  Fact Sheet for Healthcare Providers: quierodirigir.com  This test is not yet approved or cleared by the [place] FDA and  has been authorized for detection and/or diagnosis of SARS-CoV-2 by FDA under an Emergency Use Authorization (EUA). This EUA will remain  in effect (meaning this test can be used) for the duration of the COVID-19 declaration under Se ction 564(b)(1) of the Act, 21 U.S.C. section 360bbb-3(b)(1), unless the authorization is terminated or revoked sooner.  Performed at [facility], [address] [place], [place] [address]   MRSA PCR Screening     Status: None   Collection Time: 07/13/20  8:43 PM   Specimen: Nasopharyngeal  Result Value Ref Range Status   MRSA by PCR NEGATIVE NEGATIVE Final    Comment:        The GeneXpert MRSA Assay (FDA approved for NASAL specimens only), is one component of a comprehensive MRSA colonization surveillance program. It  is not intended to diagnose MRSA infection nor to guide or monitor treatment for MRSA infections. Performed at [facility], [address], [place], [place] [address]   Culture, blood (routine x 2)     Status: Abnormal   Collection Time: 07/13/20  9:14 PM   Specimen: BLOOD  Result Value Ref Range Status   Specimen Description BLOOD BLOOD RIGHT FOREARM  Final   Special Requests   Final    BOTTLES DRAWN AEROBIC ONLY Blood Culture results may not be optimal due to an inadequate volume of blood received in culture bottles   Culture  Setup Time   Final    GRAM POSITIVE COCCI IN CLUSTERS AEROBIC BOTTLE ONLY CRITICAL RESULT CALLED TO, READ BACK BY AND VERIFIED WITHPeter Minium PHARMD 1252 07/15/20 A BROWNING    Culture (  A)  Final    STAPHYLOCOCCUS EPIDERMIDIS THE SIGNIFICANCE OF ISOLATING THIS ORGANISM FROM A SINGLE SET OF BLOOD CULTURES WHEN MULTIPLE SETS ARE DRAWN IS UNCERTAIN. PLEASE NOTIFY THE MICROBIOLOGY DEPARTMENT WITHIN ONE WEEK IF SPECIATION AND SENSITIVITIES ARE REQUIRED. Performed at [facility], [address] [place], [place] [address]    Report Status 07/16/2020 FINAL  Final  Blood Culture ID Panel (Reflexed)     Status: Abnormal   Collection Time: 07/13/20  9:14 PM  Result Value Ref Range Status   Enterococcus faecalis NOT DETECTED NOT DETECTED Final   Enterococcus Faecium NOT DETECTED NOT DETECTED Final   Listeria monocytogenes NOT DETECTED NOT DETECTED Final   Staphylococcus species DETECTED (A) NOT DETECTED Final    Comment: CRITICAL RESULT CALLED TO, READ BACK BY AND VERIFIED WITH: Peter MiniumJ FRENS PHARMD 1252 07/15/20 A BROWNING    Staphylococcus aureus (BCID) NOT DETECTED NOT DETECTED Final   Staphylococcus epidermidis DETECTED (A) NOT DETECTED Final    Comment: Methicillin (oxacillin) resistant coagulase negative staphylococcus. Possible blood culture contaminant (unless isolated from more than one blood culture draw or clinical case suggests pathogenicity). No  antibiotic treatment is indicated for blood  culture contaminants. CRITICAL RESULT CALLED TO, READ BACK BY AND VERIFIED WITH: Peter MiniumJ FRENS PHARMD 1252 07/15/20 A BROWNING    Staphylococcus lugdunensis NOT DETECTED NOT DETECTED Final   Streptococcus species NOT DETECTED NOT DETECTED Final   Streptococcus agalactiae NOT DETECTED NOT DETECTED Final   Streptococcus pneumoniae NOT DETECTED NOT DETECTED Final   Streptococcus pyogenes NOT DETECTED NOT DETECTED Final   A.calcoaceticus-baumannii NOT DETECTED NOT DETECTED Final   Bacteroides fragilis NOT DETECTED NOT DETECTED Final   Enterobacterales NOT DETECTED NOT DETECTED Final   Enterobacter cloacae complex NOT DETECTED NOT DETECTED Final   Escherichia coli NOT DETECTED NOT DETECTED Final   Klebsiella aerogenes NOT DETECTED NOT DETECTED Final   Klebsiella oxytoca NOT DETECTED NOT DETECTED Final   Klebsiella pneumoniae NOT DETECTED NOT DETECTED Final   Proteus species NOT DETECTED NOT DETECTED Final   Salmonella species NOT DETECTED NOT DETECTED Final   Serratia marcescens NOT DETECTED NOT DETECTED Final   Haemophilus influenzae NOT DETECTED NOT DETECTED Final   Neisseria meningitidis NOT DETECTED NOT DETECTED Final   Pseudomonas aeruginosa NOT DETECTED NOT DETECTED Final   Stenotrophomonas maltophilia NOT DETECTED NOT DETECTED Final   Candida albicans NOT DETECTED NOT DETECTED Final   Candida auris NOT DETECTED NOT DETECTED Final   Candida glabrata NOT DETECTED NOT DETECTED Final   Candida krusei NOT DETECTED NOT DETECTED Final   Candida parapsilosis NOT DETECTED NOT DETECTED Final   Candida tropicalis NOT DETECTED NOT DETECTED Final   Cryptococcus neoformans/gattii NOT DETECTED NOT DETECTED Final   Methicillin resistance mecA/C DETECTED (A) NOT DETECTED Final    Comment: CRITICAL RESULT CALLED TO, READ BACK BY AND VERIFIED WITHPeter Minium: J FRENS PHARMD 1252 07/15/20 A BROWNING Performed at [facility], [address] [place], [place]  [address]   Culture, Urine     Status: None   Collection Time: 07/13/20  9:20 PM   Specimen: Urine, Random  Result Value Ref Range Status   Specimen Description URINE, RANDOM  Final   Special Requests NONE  Final   Culture   Final    NO GROWTH Performed at [facility], [address] [place], [place] [address]    Report Status 07/15/2020 FINAL  Final  Culture, blood (routine x 2)     Status: Abnormal  Collection Time: 07/13/20  9:23 PM   Specimen: BLOOD  Result Value Ref Range Status   Specimen Description BLOOD RIGHT ANTECUBITAL  Final   Special Requests   Final    BOTTLES DRAWN AEROBIC ONLY Blood Culture results may not be optimal due to an inadequate volume of blood received in culture bottles   Culture  Setup Time   Final    AEROBIC BOTTLE ONLY GRAM POSITIVE RODS CRITICAL RESULT CALLED TO, READ BACK BY AND VERIFIED WITH: G ABBOTT PHARMD 07/16/20 0442 JDW    Culture (A)  Final    DIPHTHEROIDS(CORYNEBACTERIUM SPECIES) Standardized susceptibility testing for this organism is not available. Performed at [facility], [address] [place], [place] [address]    Report Status 07/18/2020 FINAL  Final    Procedures and diagnostic studies:  No results found.  Medications:   . chlorhexidine  15 mL Mouth Rinse BID  . Chlorhexidine Gluconate Cloth  6 each Topical Q0600  . furosemide  40 mg Intravenous Q12H  . heparin  5,000 Units Subcutaneous Q8H  . insulin aspart  0-20 Units Subcutaneous Q4H  . levothyroxine  25 mcg Oral Q0600  . mouth rinse  15 mL Mouth Rinse q12n4p  . memantine  10 mg Oral Daily  . pantoprazole (PROTONIX) IV  40 mg Intravenous Q24H   Continuous Infusions: . sodium chloride 50 mL/hr at 07/15/20 1036  . sodium chloride 100 mL/hr at 07/18/20 1914  . feeding supplement (OSMOLITE 1.2 CAL) 1,000 mL (07/14/20 1209)     LOS: 5 days   Joseph Art  [facility]   How to contact the [facility] Attending or Consulting provider 7A - 7P or  covering provider during after hours 7P -7A, for this patient?  1. Check the care team in [facility] and look for a) attending/consulting [facility] provider listed and b) the [facility] team listed 2. Log into [email or web address] and use [place]'s universal password to access. If you do not have the password, please contact the hospital operator. 3. Locate the [facility] provider you are looking for under [facility] and page to a number that you can be directly reached. 4. If you still have difficulty reaching the provider, please page the [facility] (Director on Call) for the Hospitalists listed on amion for assistance.  07/18/2020, 12:04 PM

## 2020-07-18 NOTE — Progress Notes (Signed)
[facility] ROUNDING NOTE   Subjective:   Interval History: This 87 year old lady with past medical history hypertension diabetes dementia lives in a nursing home.  She was seen for hyponatremia with a serum sodium level dropped down to 110.  This was in conjunction with use of a thiazide diuretic.  Labs were consistent with use of thiazide diuretic.  Is possibility that she   may have SIADH.  Blood pressure 134/53 pulse 69 temperature 97.9 O2 sats 99% room air  Her serum sodium is improved to 126.  We will continue to follow her with the use of IV saline and IV diuretics.  I will anticipate improvement.  I would continue free water restriction.  Objective:  Vital signs in last 24 hours:  Temp:  [97.9 F (36.6 C)-98.3 F (36.8 C)] 97.9 F (36.6 C) (04/24 0733) Pulse Rate:  [68-99] 68 (04/24 0733) Resp:  [13-16] 13 (04/24 0733) BP: (122-162)/(43-81) 134/53 (04/24 0733) SpO2:  [95 %-100 %] 99 % (04/24 0733) Weight:  [62.3 kg] 62.3 kg (04/24 0400)  Weight change: 0.1 kg Filed Weights   07/16/20 0432 07/17/20 0500 07/18/20 0400  Weight: 62.2 kg 62.2 kg 62.3 kg    Intake/Output: I/O last 3 completed shifts: In: 3777.6 [P.O.:120; I.V.:2048.9; Blood:314; NG/GT:1155; IV Piggyback:139.7] Out: 8500 [Urine:8500]   Intake/Output this shift:  No intake/output data recorded.  JYN:WGNFA in hospital bed, resting comfortably, in NAD CVS:RRR, no murmurs or rubs Resp:Breathing unlabored, BS clear & equal bilaterally OZH:YQMV, non-tender HQI:ONGE, dry.    Basic Metabolic Panel: Recent Labs  Lab 07/14/20 0647 07/14/20 1220 07/14/20 1650 07/14/20 2141 07/15/20 0657 07/15/20 1637 07/15/20 2058 07/16/20 0538 07/16/20 0926 07/17/20 0024 07/17/20 0838 07/17/20 1150 07/17/20 1613 07/17/20 2041 07/18/20 0036 07/18/20 0439  NA 113* 114* 116*   < > 121* 120*   < > 120*   < > 121*   < > 123* 124* 124* 125* 126*  K 3.0* 3.0*  --   --  4.3  --   --  4.5  --  4.4   --   --   --   --   --  4.2  CL 80* 82*  --   --  90*  --   --  90*  --  91*  --   --   --   --   --  94*  CO2 26 27  --   --  26  --   --  25  --  26  --   --   --   --   --  25  GLUCOSE 67* 74  --   --  206*  --   --  189*  --  208*  --   --   --   --   --  107*  BUN <5* <5*  --   --  6*  --   --  5*  --  5*  --   --   --   --   --  5*  CREATININE <0.30* <0.30*  --   --  0.38*  --   --  0.33*  --  <0.30*  --   --   --   --   --  0.31*  CALCIUM 7.4* 7.5*  --   --  7.9*  --   --  7.8*  --  7.7*  --   --   --   --   --  8.3*  MG  1.2*  --  2.1  --  1.9 2.0  --  1.8  --   --   --   --   --   --   --   --   PHOS 2.6  --  2.4*  --  1.4* 3.0  --  2.4*  --   --   --   --   --   --   --   --    < > = values in this interval not displayed.    Liver Function Tests: Recent Labs  Lab 07/13/20 1138  AST 49*  ALT 16  ALKPHOS 55  BILITOT 1.2  PROT 3.2*  ALBUMIN 1.7*   No results for input(s): LIPASE, AMYLASE in the last 168 hours. No results for input(s): AMMONIA in the last 168 hours.  CBC: Recent Labs  Lab 07/13/20 1700 07/14/20 0647 07/16/20 0538 07/17/20 0024 07/17/20 1613 07/18/20 0439  WBC 7.9 6.4 6.3 5.5  --  7.2  HGB 7.6* 7.9* 7.4* 6.7* 9.8* 9.2*  HCT 22.6* 23.4* 23.0* 20.5* 29.5* 28.9*  MCV 77.9* 78.0* 79.6* 80.1  --  81.6  PLT 238 243 198 172  --  169    Cardiac Enzymes: No results for input(s): CKTOTAL, CKMB, CKMBINDEX, TROPONINI in the last 168 hours.  BNP: Invalid input(s): POCBNP  CBG: Recent Labs  Lab 07/17/20 1544 07/17/20 2017 07/17/20 2345 07/18/20 0401 07/18/20 0730  GLUCAP 221* 200* 114* 103* 104*    Microbiology: Results for orders placed or performed during the hospital encounter of 07/13/20  SARS CORONAVIRUS 2 (TAT 6-24 HRS) Nasopharyngeal Nasopharyngeal Swab     Status: None   Collection Time: 07/13/20  3:13 PM   Specimen: Nasopharyngeal Swab  Result Value Ref Range Status   SARS Coronavirus 2 NEGATIVE NEGATIVE Final    Comment:  (NOTE) SARS-CoV-2 target nucleic acids are NOT DETECTED.  The SARS-CoV-2 RNA is generally detectable in upper and lower respiratory specimens during the acute phase of infection. Negative results do not preclude SARS-CoV-2 infection, do not rule out co-infections with other pathogens, and should not be used as the sole basis for treatment or other patient management decisions. Negative results must be combined with clinical observations, patient history, and epidemiological information. The expected result is Negative.  Fact Sheet for Patients: HairSlick.nohttps://www.fda.gov/media/138098/download  Fact Sheet for Healthcare Providers: quierodirigir.comhttps://www.fda.gov/media/138095/download  This test is not yet approved or cleared by the [place] FDA and  has been authorized for detection and/or diagnosis of SARS-CoV-2 by FDA under an Emergency Use Authorization (EUA). This EUA will remain  in effect (meaning this test can be used) for the duration of the COVID-19 declaration under Se ction 564(b)(1) of the Act, 21 U.S.C. section 360bbb-3(b)(1), unless the authorization is terminated or revoked sooner.  Performed at [facility], [address] [place], [place] [address]   MRSA PCR Screening     Status: None   Collection Time: 07/13/20  8:43 PM   Specimen: Nasopharyngeal  Result Value Ref Range Status   MRSA by PCR NEGATIVE NEGATIVE Final    Comment:        The GeneXpert MRSA Assay (FDA approved for NASAL specimens only), is one component of a comprehensive MRSA colonization surveillance program. It is not intended to diagnose MRSA infection nor to guide or monitor treatment for MRSA infections. Performed at [facility], [address] [place], [place] [address]   Culture, blood (routine x 2)  Status: Abnormal   Collection Time: 07/13/20  9:14 PM   Specimen: BLOOD  Result Value Ref Range Status   Specimen Description BLOOD BLOOD RIGHT FOREARM  Final   Special Requests    Final    BOTTLES DRAWN AEROBIC ONLY Blood Culture results may not be optimal due to an inadequate volume of blood received in culture bottles   Culture  Setup Time   Final    GRAM POSITIVE COCCI IN CLUSTERS AEROBIC BOTTLE ONLY CRITICAL RESULT CALLED TO, READ BACK BY AND VERIFIED WITH: Peter Minium PHARMD 1252 07/15/20 A BROWNING    Culture (A)  Final    STAPHYLOCOCCUS EPIDERMIDIS THE SIGNIFICANCE OF ISOLATING THIS ORGANISM FROM A SINGLE SET OF BLOOD CULTURES WHEN MULTIPLE SETS ARE DRAWN IS UNCERTAIN. PLEASE NOTIFY THE MICROBIOLOGY DEPARTMENT WITHIN ONE WEEK IF SPECIATION AND SENSITIVITIES ARE REQUIRED. Performed at [facility], [address], [place], [place] [address]    Report Status 07/16/2020 FINAL  Final  Blood Culture ID Panel (Reflexed)     Status: Abnormal   Collection Time: 07/13/20  9:14 PM  Result Value Ref Range Status   Enterococcus faecalis NOT DETECTED NOT DETECTED Final   Enterococcus Faecium NOT DETECTED NOT DETECTED Final   Listeria monocytogenes NOT DETECTED NOT DETECTED Final   Staphylococcus species DETECTED (A) NOT DETECTED Final    Comment: CRITICAL RESULT CALLED TO, READ BACK BY AND VERIFIED WITH: Peter Minium PHARMD 1252 07/15/20 A BROWNING    Staphylococcus aureus (BCID) NOT DETECTED NOT DETECTED Final   Staphylococcus epidermidis DETECTED (A) NOT DETECTED Final    Comment: Methicillin (oxacillin) resistant coagulase negative staphylococcus. Possible blood culture contaminant (unless isolated from more than one blood culture draw or clinical case suggests pathogenicity). No antibiotic treatment is indicated for blood  culture contaminants. CRITICAL RESULT CALLED TO, READ BACK BY AND VERIFIED WITH: Peter Minium PHARMD 1252 07/15/20 A BROWNING    Staphylococcus lugdunensis NOT DETECTED NOT DETECTED Final   Streptococcus species NOT DETECTED NOT DETECTED Final   Streptococcus agalactiae NOT DETECTED NOT DETECTED Final   Streptococcus pneumoniae NOT DETECTED NOT DETECTED  Final   Streptococcus pyogenes NOT DETECTED NOT DETECTED Final   A.calcoaceticus-baumannii NOT DETECTED NOT DETECTED Final   Bacteroides fragilis NOT DETECTED NOT DETECTED Final   Enterobacterales NOT DETECTED NOT DETECTED Final   Enterobacter cloacae complex NOT DETECTED NOT DETECTED Final   Escherichia coli NOT DETECTED NOT DETECTED Final   Klebsiella aerogenes NOT DETECTED NOT DETECTED Final   Klebsiella oxytoca NOT DETECTED NOT DETECTED Final   Klebsiella pneumoniae NOT DETECTED NOT DETECTED Final   Proteus species NOT DETECTED NOT DETECTED Final   Salmonella species NOT DETECTED NOT DETECTED Final   Serratia marcescens NOT DETECTED NOT DETECTED Final   Haemophilus influenzae NOT DETECTED NOT DETECTED Final   Neisseria meningitidis NOT DETECTED NOT DETECTED Final   Pseudomonas aeruginosa NOT DETECTED NOT DETECTED Final   Stenotrophomonas maltophilia NOT DETECTED NOT DETECTED Final   Candida albicans NOT DETECTED NOT DETECTED Final   Candida auris NOT DETECTED NOT DETECTED Final   Candida glabrata NOT DETECTED NOT DETECTED Final   Candida krusei NOT DETECTED NOT DETECTED Final   Candida parapsilosis NOT DETECTED NOT DETECTED Final   Candida tropicalis NOT DETECTED NOT DETECTED Final   Cryptococcus neoformans/gattii NOT DETECTED NOT DETECTED Final   Methicillin resistance mecA/C DETECTED (A) NOT DETECTED Final    Comment: CRITICAL RESULT CALLED TO, READ BACK BY AND VERIFIED WITHPeter Minium PHARMD 1252 07/15/20 A  BROWNING Performed at [facility], [address], [place], [place] [address]   Culture, Urine     Status: None   Collection Time: 07/13/20  9:20 PM   Specimen: Urine, Random  Result Value Ref Range Status   Specimen Description URINE, RANDOM  Final   Special Requests NONE  Final   Culture   Final    NO GROWTH Performed at [facility], [address] [place], [place] [address]    Report Status 07/15/2020 FINAL  Final  Culture, blood (routine x 2)      Status: None (Preliminary result)   Collection Time: 07/13/20  9:23 PM   Specimen: BLOOD  Result Value Ref Range Status   Specimen Description BLOOD RIGHT ANTECUBITAL  Final   Special Requests   Final    BOTTLES DRAWN AEROBIC ONLY Blood Culture results may not be optimal due to an inadequate volume of blood received in culture bottles   Culture  Setup Time   Final    AEROBIC BOTTLE ONLY GRAM POSITIVE RODS CRITICAL RESULT CALLED TO, READ BACK BY AND VERIFIED WITH: G ABBOTT PHARMD 07/16/20 0442 JDW    Culture   Final    GRAM POSITIVE RODS CULTURE REINCUBATED FOR BETTER GROWTH Performed at [facility], [address] [place], [place] [address]    Report Status PENDING  Incomplete    Coagulation Studies: No results for input(s): LABPROT, INR in the last 72 hours.  Urinalysis: No results for input(s): COLORURINE, LABSPEC, PHURINE, GLUCOSEU, HGBUR, BILIRUBINUR, KETONESUR, PROTEINUR, UROBILINOGEN, NITRITE, LEUKOCYTESUR in the last 72 hours.  Invalid input(s): APPERANCEUR    Imaging: No results found.   Medications:   . sodium chloride 50 mL/hr at 07/15/20 1036  . sodium chloride 100 mL/hr at 07/18/20 3220  . feeding supplement (OSMOLITE 1.2 CAL) 1,000 mL (07/14/20 1209)   . chlorhexidine  15 mL Mouth Rinse BID  . Chlorhexidine Gluconate Cloth  6 each Topical Q0600  . furosemide  40 mg Intravenous Q12H  . heparin  5,000 Units Subcutaneous Q8H  . insulin aspart  0-20 Units Subcutaneous Q4H  . levothyroxine  25 mcg Oral Q0600  . mouth rinse  15 mL Mouth Rinse q12n4p  . memantine  10 mg Oral Daily  . pantoprazole (PROTONIX) IV  40 mg Intravenous Q24H   sodium chloride  Assessment/ Plan:  1.Significant hyponatremia Urine osmolality 269 urine sodium 98 07/13/2020.  Outpatient use of chlorthalidone this has been discontinued.  Serum sodium was continue to improve requiring 3% saline now receiving normal saline at 100 cc an hour.  Continues on Lasix 40 mg twice daily.  We  will continue to follow.  Has been slow gradual improvement in sodium.   2. Hypocalcemia - 1g Ca gluconate administered 4/19.  - Corrected Ca in setting of hypoalbuminuria is WNL - Continue to monitor  3. Anemia - Hgb has improved from 6.5 -> 7.4. Continue to monitor.  4. Hypomagnesemia - Serum magimproved to 1.8 with repletion. Continue to monitor  5. Hypokalemia - replete prn  Appropriate increase in sodium.  We will continue combination of Lasix and saline for now.  Will discontinue once sodium is greater than 130 mM.  We will sign off at this point please call if needed   LOS: 5 Garnetta Buddy @TODAY @8 :31 AM

## 2020-07-19 DIAGNOSIS — E871 Hypo-osmolality and hyponatremia: Secondary | ICD-10-CM | POA: Diagnosis not present

## 2020-07-19 LAB — CBC
HCT: 26.9 % — ABNORMAL LOW (ref 36.0–46.0)
Hemoglobin: 8.7 g/dL — ABNORMAL LOW (ref 12.0–15.0)
MCH: 26.5 pg (ref 26.0–34.0)
MCHC: 32.3 g/dL (ref 30.0–36.0)
MCV: 82 fL (ref 80.0–100.0)
Platelets: 187 10*3/uL (ref 150–400)
RBC: 3.28 MIL/uL — ABNORMAL LOW (ref 3.87–5.11)
RDW: 15.8 % — ABNORMAL HIGH (ref 11.5–15.5)
WBC: 7 10*3/uL (ref 4.0–10.5)
nRBC: 0 % (ref 0.0–0.2)

## 2020-07-19 LAB — GLUCOSE, CAPILLARY
Glucose-Capillary: 106 mg/dL — ABNORMAL HIGH (ref 70–99)
Glucose-Capillary: 120 mg/dL — ABNORMAL HIGH (ref 70–99)
Glucose-Capillary: 152 mg/dL — ABNORMAL HIGH (ref 70–99)
Glucose-Capillary: 212 mg/dL — ABNORMAL HIGH (ref 70–99)
Glucose-Capillary: 234 mg/dL — ABNORMAL HIGH (ref 70–99)
Glucose-Capillary: 82 mg/dL (ref 70–99)
Glucose-Capillary: 89 mg/dL (ref 70–99)

## 2020-07-19 LAB — BASIC METABOLIC PANEL
Anion gap: 5 (ref 5–15)
BUN: 5 mg/dL — ABNORMAL LOW (ref 8–23)
CO2: 27 mmol/L (ref 22–32)
Calcium: 8.3 mg/dL — ABNORMAL LOW (ref 8.9–10.3)
Chloride: 94 mmol/L — ABNORMAL LOW (ref 98–111)
Creatinine, Ser: 0.33 mg/dL — ABNORMAL LOW (ref 0.44–1.00)
GFR, Estimated: >60 mL/min (ref >60)
Glucose, Bld: 84 mg/dL (ref 70–99)
Potassium: 3.4 mmol/L — ABNORMAL LOW (ref 3.5–5.1)
Sodium: 126 mmol/L — ABNORMAL LOW (ref 135–145)

## 2020-07-19 MED ORDER — ENSURE ENLIVE PO LIQD
237.0000 mL | Freq: Two times a day (BID) | ORAL | Status: DC
  Administered 2020-07-20 – 2020-07-23 (×7): 237 mL via ORAL

## 2020-07-19 MED ORDER — OSMOLITE 1.5 CAL PO LIQD
840.0000 mL | ORAL | Status: DC
  Administered 2020-07-19 – 2020-07-22 (×4): 840 mL
  Filled 2020-07-19 (×3): qty 948

## 2020-07-19 MED ORDER — FREE WATER
30.0000 mL | Status: DC
  Administered 2020-07-19 – 2020-07-24 (×18): 30 mL

## 2020-07-19 MED ORDER — OSMOLITE 1.2 CAL PO LIQD: 1000.0000 mL | ORAL | Status: DC

## 2020-07-19 MED ORDER — MAGNESIUM SULFATE 2 GM/50ML IV SOLN
2.0000 g | Freq: Once | INTRAVENOUS | Status: AC
  Administered 2020-07-19: 2 g via INTRAVENOUS
  Filled 2020-07-19: qty 50

## 2020-07-19 MED ORDER — PROSOURCE TF PO LIQD
45.0000 mL | Freq: Two times a day (BID) | ORAL | Status: DC
  Administered 2020-07-19 – 2020-07-23 (×8): 45 mL
  Filled 2020-07-19 (×8): qty 45

## 2020-07-19 MED ORDER — ADULT MULTIVITAMIN LIQUID CH
15.0000 mL | Freq: Every day | ORAL | Status: DC
  Administered 2020-07-20 – 2020-07-21 (×2): 15 mL via ORAL
  Filled 2020-07-19 (×2): qty 15

## 2020-07-19 MED ORDER — POTASSIUM CHLORIDE CRYS ER 20 MEQ PO TBCR
40.0000 meq | EXTENDED_RELEASE_TABLET | Freq: Once | ORAL | Status: AC
  Administered 2020-07-19: 40 meq via ORAL
  Filled 2020-07-19: qty 2

## 2020-07-19 NOTE — Progress Notes (Signed)
Progress Note    Suzanne Fox  QVZ:[id number] DOB: [birth year]  DOA: 07/13/2020 PCP: Georgianne Fick, MD    Brief Narrative:     Medical records reviewed and are as summarized below:  Suzanne Fox is an 87 y.o. female with PMH significant for HTN, DM2, dementia and recent cervical surgery for hematoma who lives at [facility] healthcare is brought in for somnolence.  Na found to be extremely low and is being slowly corrected.  Initially on 3% but now on IVF And IV lasix.  Another issue has been Hgb trending down and needing PRBCs    Assessment/Plan:   Principal Problem:   Hyponatremia Active Problems:   Benign essential HTN   Type 2 diabetes mellitus without complication (HCC)   Malnutrition of moderate degree   Hyponatremia - is on NaCl 1 gram TID at SNF -Likely 2/2 poor solute intake, chlorthalidone vs SIADH - improving - 3% NaCl off for now - decrease NS and continue higher dose of lasix IV Nephrology consulted, appreciate assistance  -diet ordered with fluid restriction -will hold tube feeds and see if appetite improves -if not eating much will need palliative care consult for GOC  Hypokalemia Supplement as needed  UTI -Mod leukocytosis.   -ceftriaxone - stop date placed  Anemia - heme positive Trend cbc   Transfuse per protocol - s/p 1 unit PRBC 4/23 Hgb goal greater than 7 -at this point patient not a candidate for EGD/colonoscopy  Hypothyroidism -Home medications includes Synthroid  -TSH normal  Hypertension -hold HCTZ  Hyperlipidemia -Home meds Lipitor   Diabetes type 2 -SSI  Dementia -Home med include Namenda   +blood cultures with what appears to be a contaminant      Family Communication/Anticipated D/C date and plan/Code Status   DVT prophylaxis: heparin Code Status: DNR  Disposition Plan: Status is: Inpatient Spoke with niece 4/23 Remains inpatient appropriate because:Inpatient level of care appropriate due to  severity of illness   Dispo: The patient is from: SNF              Anticipated d/c is to: SNF              Patient currently is not medically stable to d/c.   Difficult to place patient No         Medical Consultants:   neprho   Subjective:   Denies SOB or chest pain  Objective:    Vitals:   07/18/20 2000 07/18/20 2317 07/19/20 0400 07/19/20 0737  BP: (!) 136/51 (!) 127/47 (!) 126/51 (!) 122/49  Pulse: 69 80 77 78  Resp: 15 19 17 20   Temp: 98.1 F (36.7 C) 97.8 F (36.6 C) 98 F (36.7 C) 98.6 F (37 C)  TempSrc: Oral Oral Oral Oral  SpO2: 98% 100% 98% 100%  Weight:   60.1 kg   Height:        Intake/Output Summary (Last 24 hours) at 07/19/2020 1008 Last data filed at 07/19/2020 07/21/2020 Gross per 24 hour  Intake 3927.35 ml  Output 2250 ml  Net 1677.35 ml   Filed Weights   07/17/20 0500 07/18/20 0400 07/19/20 0400  Weight: 62.2 kg 62.3 kg 60.1 kg    Exam:   General: Appearance:    elderly female in no acute distress     Lungs:     respirations unlabored  Heart:    Normal heart rate. Normal rhythm. No murmurs, rubs, or gallops.   MS:   All extremities are  intact.   Neurologic:   Will awaken, pleasant and cooperative   Data Reviewed:   I have personally reviewed following labs and imaging studies:  Labs: Labs show the following:   Basic Metabolic Panel: Recent Labs  Lab 07/14/20 0647 07/14/20 1220 07/14/20 1650 07/14/20 2141 07/15/20 0657 07/15/20 1637 07/15/20 2058 07/16/20 3546 07/16/20 5681 07/17/20 0024 07/17/20 2751 07/17/20 2041 07/18/20 0036 07/18/20 0439 07/18/20 0755 07/19/20 0031  NA 113*   < > 116*   < > 121* 120*   < > 120*   < > 121*   < > 124* 125* 126* 126* 126*  K 3.0*   < >  --   --  4.3  --   --  4.5  --  4.4  --   --   --  4.2  --  3.4*  CL 80*   < >  --   --  90*  --   --  90*  --  91*  --   --   --  94*  --  94*  CO2 26   < >  --   --  26  --   --  25  --  26  --   --   --  25  --  27  GLUCOSE 67*   < >  --   --   206*  --   --  189*  --  208*  --   --   --  107*  --  84  BUN <5*   < >  --   --  6*  --   --  5*  --  5*  --   --   --  5*  --  5*  CREATININE <0.30*   < >  --   --  0.38*  --   --  0.33*  --  <0.30*  --   --   --  0.31*  --  0.33*  CALCIUM 7.4*   < >  --   --  7.9*  --   --  7.8*  --  7.7*  --   --   --  8.3*  --  8.3*  MG 1.2*  --  2.1  --  1.9 2.0  --  1.8  --   --   --   --   --   --   --   --   PHOS 2.6  --  2.4*  --  1.4* 3.0  --  2.4*  --   --   --   --   --   --   --   --    < > = values in this interval not displayed.   GFR Estimated Creatinine Clearance: 43.7 mL/min (A) (by C-G formula based on SCr of 0.33 mg/dL (L)). Liver Function Tests: Recent Labs  Lab 07/13/20 1138  AST 49*  ALT 16  ALKPHOS 55  BILITOT 1.2  PROT 3.2*  ALBUMIN 1.7*   No results for input(s): LIPASE, AMYLASE in the last 168 hours. No results for input(s): AMMONIA in the last 168 hours. Coagulation profile No results for input(s): INR, PROTIME in the last 168 hours.  CBC: Recent Labs  Lab 07/14/20 0647 07/16/20 0538 07/17/20 0024 07/17/20 1613 07/18/20 0439 07/19/20 0031  WBC 6.4 6.3 5.5  --  7.2 7.0  HGB 7.9* 7.4* 6.7* 9.8* 9.2* 8.7*  HCT 23.4* 23.0* 20.5* 29.5* 28.9* 26.9*  MCV 78.0*  79.6* 80.1  --  81.6 82.0  PLT 243 198 172  --  169 187   Cardiac Enzymes: No results for input(s): CKTOTAL, CKMB, CKMBINDEX, TROPONINI in the last 168 hours. BNP (last 3 results) No results for input(s): PROBNP in the last 8760 hours. CBG: Recent Labs  Lab 07/18/20 1533 07/18/20 2018 07/19/20 0000 07/19/20 0454 07/19/20 0758  GLUCAP 140* 123* 89 82 120*   D-Dimer: No results for input(s): DDIMER in the last 72 hours. Hgb A1c: No results for input(s): HGBA1C in the last 72 hours. Lipid Profile: No results for input(s): CHOL, HDL, LDLCALC, TRIG, CHOLHDL, LDLDIRECT in the last 72 hours. Thyroid function studies: No results for input(s): TSH, T4TOTAL, T3FREE, THYROIDAB in the last 72  hours.  Invalid input(s): FREET3 Anemia work up: No results for input(s): VITAMINB12, FOLATE, FERRITIN, TIBC, IRON, RETICCTPCT in the last 72 hours. Sepsis Labs: Recent Labs  Lab 07/13/20 2117 07/14/20 0647 07/16/20 0538 07/17/20 0024 07/18/20 0439 07/19/20 0031  PROCALCITON <0.10  --   --   --   --   --   WBC  --    < > 6.3 5.5 7.2 7.0  LATICACIDVEN 0.8  --   --   --   --   --    < > = values in this interval not displayed.    Microbiology Recent Results (from the past 240 hour(s))  SARS CORONAVIRUS 2 (TAT 6-24 HRS) Nasopharyngeal Nasopharyngeal Swab     Status: None   Collection Time: 07/13/20  3:13 PM   Specimen: Nasopharyngeal Swab  Result Value Ref Range Status   SARS Coronavirus 2 NEGATIVE NEGATIVE Final    Comment: (NOTE) SARS-CoV-2 target nucleic acids are NOT DETECTED.  The SARS-CoV-2 RNA is generally detectable in upper and lower respiratory specimens during the acute phase of infection. Negative results do not preclude SARS-CoV-2 infection, do not rule out co-infections with other pathogens, and should not be used as the sole basis for treatment or other patient management decisions. Negative results must be combined with clinical observations, patient history, and epidemiological information. The expected result is Negative.  Fact Sheet for Patients: HairSlick.nohttps://www.fda.gov/media/138098/download  Fact Sheet for Healthcare Providers: quierodirigir.comhttps://www.fda.gov/media/138095/download  This test is not yet approved or cleared by the [place] FDA and  has been authorized for detection and/or diagnosis of SARS-CoV-2 by FDA under an Emergency Use Authorization (EUA). This EUA will remain  in effect (meaning this test can be used) for the duration of the COVID-19 declaration under Se ction 564(b)(1) of the Act, 21 U.S.C. section 360bbb-3(b)(1), unless the authorization is terminated or revoked sooner.  Performed at [facility], [address]  [place], [place] [address]   MRSA PCR Screening     Status: None   Collection Time: 07/13/20  8:43 PM   Specimen: Nasopharyngeal  Result Value Ref Range Status   MRSA by PCR NEGATIVE NEGATIVE Final    Comment:        The GeneXpert MRSA Assay (FDA approved for NASAL specimens only), is one component of a comprehensive MRSA colonization surveillance program. It is not intended to diagnose MRSA infection nor to guide or monitor treatment for MRSA infections. Performed at [facility], [address] [place], [place] [address]   Culture, blood (routine x 2)     Status: Abnormal   Collection Time: 07/13/20  9:14 PM   Specimen: BLOOD  Result Value Ref Range Status   Specimen Description BLOOD BLOOD RIGHT FOREARM  Final   Special Requests   Final    BOTTLES DRAWN AEROBIC ONLY Blood Culture results may not be optimal due to an inadequate volume of blood received in culture bottles   Culture  Setup Time   Final    GRAM POSITIVE COCCI IN CLUSTERS AEROBIC BOTTLE ONLY CRITICAL RESULT CALLED TO, READ BACK BY AND VERIFIED WITH: Peter Minium PHARMD 1252 07/15/20 A BROWNING    Culture (A)  Final    STAPHYLOCOCCUS EPIDERMIDIS THE SIGNIFICANCE OF ISOLATING THIS ORGANISM FROM A SINGLE SET OF BLOOD CULTURES WHEN MULTIPLE SETS ARE DRAWN IS UNCERTAIN. PLEASE NOTIFY THE MICROBIOLOGY DEPARTMENT WITHIN ONE WEEK IF SPECIATION AND SENSITIVITIES ARE REQUIRED. Performed at [facility], [address], [place], [place] [address]    Report Status 07/16/2020 FINAL  Final  Blood Culture ID Panel (Reflexed)     Status: Abnormal   Collection Time: 07/13/20  9:14 PM  Result Value Ref Range Status   Enterococcus faecalis NOT DETECTED NOT DETECTED Final   Enterococcus Faecium NOT DETECTED NOT DETECTED Final   Listeria monocytogenes NOT DETECTED NOT DETECTED Final   Staphylococcus species DETECTED (A) NOT DETECTED Final    Comment: CRITICAL RESULT CALLED TO, READ BACK BY AND VERIFIED WITH: Peter Minium PHARMD  1252 07/15/20 A BROWNING    Staphylococcus aureus (BCID) NOT DETECTED NOT DETECTED Final   Staphylococcus epidermidis DETECTED (A) NOT DETECTED Final    Comment: Methicillin (oxacillin) resistant coagulase negative staphylococcus. Possible blood culture contaminant (unless isolated from more than one blood culture draw or clinical case suggests pathogenicity). No antibiotic treatment is indicated for blood  culture contaminants. CRITICAL RESULT CALLED TO, READ BACK BY AND VERIFIED WITH: Peter Minium PHARMD 1252 07/15/20 A BROWNING    Staphylococcus lugdunensis NOT DETECTED NOT DETECTED Final   Streptococcus species NOT DETECTED NOT DETECTED Final   Streptococcus agalactiae NOT DETECTED NOT DETECTED Final   Streptococcus pneumoniae NOT DETECTED NOT DETECTED Final   Streptococcus pyogenes NOT DETECTED NOT DETECTED Final   A.calcoaceticus-baumannii NOT DETECTED NOT DETECTED Final   Bacteroides fragilis NOT DETECTED NOT DETECTED Final   Enterobacterales NOT DETECTED NOT DETECTED Final   Enterobacter cloacae complex NOT DETECTED NOT DETECTED Final   Escherichia coli NOT DETECTED NOT DETECTED Final   Klebsiella aerogenes NOT DETECTED NOT DETECTED Final   Klebsiella oxytoca NOT DETECTED NOT DETECTED Final   Klebsiella pneumoniae NOT DETECTED NOT DETECTED Final   Proteus species NOT DETECTED NOT DETECTED Final   Salmonella species NOT DETECTED NOT DETECTED Final   Serratia marcescens NOT DETECTED NOT DETECTED Final   Haemophilus influenzae NOT DETECTED NOT DETECTED Final   Neisseria meningitidis NOT DETECTED NOT DETECTED Final   Pseudomonas aeruginosa NOT DETECTED NOT DETECTED Final   Stenotrophomonas maltophilia NOT DETECTED NOT DETECTED Final   Candida albicans NOT DETECTED NOT DETECTED Final   Candida auris NOT DETECTED NOT DETECTED Final   Candida glabrata NOT DETECTED NOT DETECTED Final   Candida krusei NOT DETECTED NOT DETECTED Final   Candida parapsilosis NOT DETECTED NOT DETECTED Final    Candida tropicalis NOT DETECTED NOT DETECTED Final   Cryptococcus neoformans/gattii NOT DETECTED NOT DETECTED Final   Methicillin resistance mecA/C DETECTED (A) NOT DETECTED Final    Comment: CRITICAL RESULT CALLED TO, READ BACK BY AND VERIFIED WITHPeter Minium PHARMD 1252 07/15/20 A BROWNING Performed at [facility], [address] [place], [place] [address]   Culture, Urine     Status: None   Collection Time: 07/13/20  9:20 PM   Specimen: Urine, Random  Result Value Ref Range Status   Specimen Description URINE, RANDOM  Final   Special Requests NONE  Final   Culture   Final    NO GROWTH Performed at [facility], [address] [place], [place] [address]    Report Status 07/15/2020 FINAL  Final  Culture, blood (routine x 2)     Status: Abnormal   Collection Time: 07/13/20  9:23 PM   Specimen: BLOOD  Result Value Ref Range Status   Specimen Description BLOOD RIGHT ANTECUBITAL  Final   Special Requests   Final    BOTTLES DRAWN AEROBIC ONLY Blood Culture results may not be optimal due to an inadequate volume of blood received in culture bottles   Culture  Setup Time   Final    AEROBIC BOTTLE ONLY GRAM POSITIVE RODS CRITICAL RESULT CALLED TO, READ BACK BY AND VERIFIED WITH: G ABBOTT PHARMD 07/16/20 0442 JDW    Culture (A)  Final    DIPHTHEROIDS(CORYNEBACTERIUM SPECIES) Standardized susceptibility testing for this organism is not available. Performed at [facility], [address], [place], [place] [address]    Report Status 07/18/2020 FINAL  Final    Procedures and diagnostic studies:  No results found.  Medications:   . chlorhexidine  15 mL Mouth Rinse BID  . Chlorhexidine Gluconate Cloth  6 each Topical Q0600  . furosemide  40 mg Intravenous Q12H  . heparin  5,000 Units Subcutaneous Q8H  . insulin aspart  0-20 Units Subcutaneous Q4H  . levothyroxine  25 mcg Oral Q0600  . mouth rinse  15 mL Mouth Rinse q12n4p  . memantine  10 mg Oral Daily  .  pantoprazole (PROTONIX) IV  40 mg Intravenous Q24H  . potassium chloride  40 mEq Oral Once   Continuous Infusions: . sodium chloride 50 mL/hr at 07/15/20 1036  . sodium chloride 50 mL/hr at 07/19/20 0918  . [START ON 07/20/2020] feeding supplement (OSMOLITE 1.2 CAL)    . magnesium sulfate bolus IVPB       LOS: 6 days   Joseph Art  [facility]   How to contact the [facility] Attending or Consulting provider 7A - 7P or covering provider during after hours 7P -7A, for this patient?  1. Check the care team in [facility] and look for a) attending/consulting TR[facility] provider listed and b) the [facility] team listed 2. Log into [email or web address] and use [place]'s universal password to access. If you do not have the password, please contact the hospital operator. 3. Locate the [facility] provider you are looking for under [facility] and page to a number that you can be directly reached. 4. If you still have difficulty reaching the provider, please page the [facility] (Director on Call) for the Hospitalists listed on amion for assistance.  07/19/2020, 10:08 AM

## 2020-07-19 NOTE — Progress Notes (Signed)
Nutrition Follow Up Note   DOCUMENTATION CODES:   Non-severe (moderate) malnutrition in context of chronic illness  INTERVENTION:   Initiate nocturnal feeds:  Osmolite 1.5_0 /hr x 12 hours overnight (run from 1800-0600) + Pro-source TF 53m BID via tube   Free water flushes 3108mq4 hours to maintain tube patency   Regimen provides 1340kcal/day, 75g/day protein and 82073may free water   Liquid MVI daily via tube   Pt at high refeed risk; recommend monitor potassium, magnesium and phosphorus labs daily until stable  Ensure Enlive po BID, each supplement provides 350 kcal and 20 grams of protein  Magic cup TID with meals, each supplement provides 290 kcal and 9 grams of protein  NUTRITION DIAGNOSIS:   Moderate Malnutrition related to chronic illness (dementia) as evidenced by moderate fat depletion,severe muscle depletion.  GOAL:   Patient will meet greater than or equal to 90% of their needs  -met with tube feeds  MONITOR:   PO intake,Supplement acceptance,Labs,Weight trends,TF tolerance,Skin,I & O's  ASSESSMENT:   [age]ar old female [facility]. PMH of dementia, HTN, T2DM, and recent cervical surgery for hematoma. Pt admitted with hyponatremia in the setting of suspected dehydration/poor nutrition and use of chlorthalidone as well as acute metabolic encephalopathy.   4/20 - Cortrak placed, tip at the pylorus per abdominal x-ray  Pt tolerating tube feeds well at goal rate via cortrak tube. Pt seen by SLP on 4/23 and was initiated on a dysphagia 3/thin liquid diet; however, family requested for patient to have pureed foods and diet was downgraded to a dysphagia 1/thins. Spoke with MD, will change pt over to a nocturnal tube feed regimen to allow her to be able to eat more during the day. Pt ate 15% of her breakfast this morning. RD will add supplements to help pt meet her estimated needs via oral  intake. Once oral intake starts to improve, calorie count can be completed to see if patient is meeting her estimated needs. Pt remains at refeed risk.   Medications reviewed and include: lasix, insulin, synthroid, protonix, KCl, NaCl _1 /hr, Mg sulfate  Labs reviewed: Na 126(L), K 3.4(L), BUN 5(L), creat 0.33(L) P 2.4(L), Mg 1.8 wnl- 4/22 Hgb 8.7(L), Hct 26.9(L) cbgs- 120, 152 x 24 hrs  Diet Order:   Diet Order            DIET - DYS 1 Room service appropriate? Yes; Fluid consistency: Thin  Diet effective now                EDUCATION NEEDS:   Not appropriate for education at this time  Skin:  Skin Assessment: Reviewed RN Assessment  Last BM:  07/13/20 small type 6  Height:   Ht Readings from Last 1 Encounters:  07/13/20 5' 5" (1.651 m)    Weight:   Wt Readings from Last 1 Encounters:  07/19/20 60.1 kg    Ideal Body Weight:  56.8 kg  BMI:  Body mass index is 22.05 kg/m.  Estimated Nutritional Needs:   Kcal:  1400-1600  Protein:  65-80 grams  Fluid:  1.4-1.6 L  CasKoleen Distance, RD, LDN Please refer to [facility] and/or RD on-call/weekend/after hours pager

## 2020-07-19 NOTE — Care Management Important Message (Signed)
Important Message  Patient Details  Name: Suzanne Fox MRN: [id number] Date of Birth: [birth year]   Medicare Important Message Given:  Yes     Teresea Donley Stefan Church 07/19/2020, 1:53 PM

## 2020-07-20 DIAGNOSIS — E871 Hypo-osmolality and hyponatremia: Secondary | ICD-10-CM | POA: Diagnosis not present

## 2020-07-20 LAB — CBC
HCT: 26.8 % — ABNORMAL LOW (ref 36.0–46.0)
Hemoglobin: 8.5 g/dL — ABNORMAL LOW (ref 12.0–15.0)
MCH: 26.3 pg (ref 26.0–34.0)
MCHC: 31.7 g/dL (ref 30.0–36.0)
MCV: 83 fL (ref 80.0–100.0)
Platelets: 196 10*3/uL (ref 150–400)
RBC: 3.23 MIL/uL — ABNORMAL LOW (ref 3.87–5.11)
RDW: 16.2 % — ABNORMAL HIGH (ref 11.5–15.5)
WBC: 5.8 10*3/uL (ref 4.0–10.5)
nRBC: 0 % (ref 0.0–0.2)

## 2020-07-20 LAB — BASIC METABOLIC PANEL
Anion gap: 6 (ref 5–15)
BUN: 9 mg/dL (ref 8–23)
CO2: 27 mmol/L (ref 22–32)
Calcium: 8.1 mg/dL — ABNORMAL LOW (ref 8.9–10.3)
Chloride: 95 mmol/L — ABNORMAL LOW (ref 98–111)
Creatinine, Ser: 0.4 mg/dL — ABNORMAL LOW (ref 0.44–1.00)
GFR, Estimated: >60 mL/min (ref >60)
Glucose, Bld: 182 mg/dL — ABNORMAL HIGH (ref 70–99)
Potassium: 3.7 mmol/L (ref 3.5–5.1)
Sodium: 128 mmol/L — ABNORMAL LOW (ref 135–145)

## 2020-07-20 LAB — GLUCOSE, CAPILLARY
Glucose-Capillary: 141 mg/dL — ABNORMAL HIGH (ref 70–99)
Glucose-Capillary: 165 mg/dL — ABNORMAL HIGH (ref 70–99)
Glucose-Capillary: 167 mg/dL — ABNORMAL HIGH (ref 70–99)
Glucose-Capillary: 183 mg/dL — ABNORMAL HIGH (ref 70–99)
Glucose-Capillary: 208 mg/dL — ABNORMAL HIGH (ref 70–99)
Glucose-Capillary: 301 mg/dL — ABNORMAL HIGH (ref 70–99)

## 2020-07-20 LAB — IRON AND TIBC
Iron: 24 ug/dL — ABNORMAL LOW (ref 28–170)
Saturation Ratios: 8 % — ABNORMAL LOW (ref 10.4–31.8)
TIBC: 284 ug/dL (ref 250–450)
UIBC: 260 ug/dL

## 2020-07-20 LAB — PHOSPHORUS: Phosphorus: 2.9 mg/dL (ref 2.5–4.6)

## 2020-07-20 LAB — MAGNESIUM: Magnesium: 1.6 mg/dL — ABNORMAL LOW (ref 1.7–2.4)

## 2020-07-20 MED ORDER — MAGNESIUM SULFATE 2 GM/50ML IV SOLN
2.0000 g | Freq: Once | INTRAVENOUS | Status: AC
  Administered 2020-07-20: 2 g via INTRAVENOUS
  Filled 2020-07-20: qty 50

## 2020-07-20 NOTE — Progress Notes (Signed)
Progress Note    Suzanne Fox  VVO:[id number] DOB: September 23, 1929  DOA: 07/13/2020 PCP: Georgianne Fick, MD    Brief Narrative:     Medical records reviewed and are as summarized below:  Suzanne Fox is an 87 y.o. female with PMH significant for HTN, DM2, dementia and recent cervical surgery for hematoma who lives at [facility] healthcare is brought in for somnolence.  Na was found to be extremely low and is being slowly corrected.  Initially on 3% but now on IVF And IV lasix.  Another issue has been Hgb trending down and needing PRBCs. Hgb improved with diuresis    Assessment/Plan:   Principal Problem:   Hyponatremia Active Problems:   Benign essential HTN   Type 2 diabetes mellitus without complication (HCC)   Malnutrition of moderate degree   Hyponatremia - is on NaCl 1 gram TID at SNF -Likely 2/2 poor solute intake, chlorthalidone vs SIADH - improving - 3% NaCl off for now - NS and continue higher dose of lasix IV until Na >130 Nephrology consulted, appreciate assistance - they have signed off -diet ordered with fluid restriction -will do tube feeds at night only -have asked nursing to document how much she eats during the day and if not eating much will need palliative care consult for GOC  Hypokalemia Supplement as needed  UTI -Mod leukocytosis.   -ceftriaxone - treated  Anemia - heme positive Trend cbc   Transfuse per protocol - s/p 1 unit PRBC 4/23 Hgb goal greater than 7 -at this point patient not a candidate for EGD/colonoscopy- heme +  Hypothyroidism -Home medications includes Synthroid  -TSH normal  Hypertension -d/c HCTZ  Hyperlipidemia -Home meds Lipitor   Diabetes type 2 -SSI  Dementia -Home med include Namenda   +blood cultures with what appears to be a contaminant      Family Communication/Anticipated D/C date and plan/Code Status   DVT prophylaxis: heparin Code Status: DNR  Disposition Plan: Status is:  Inpatient LM for neice Remains inpatient appropriate because:Inpatient level of care appropriate due to severity of illness   Dispo: The patient is from: SNF              Anticipated d/c is to: SNF              Patient currently is not medically stable to d/c.   Difficult to place patient No         Medical Consultants:   nephrology   Subjective:   No complaints  Objective:    Vitals:   07/19/20 2322 07/20/20 0326 07/20/20 0735 07/20/20 1104  BP: (!) 129/43 (!) 135/45 (!) 142/51 (!) 136/51  Pulse: 82 85 83 72  Resp: 20 18 18 16   Temp: 98 F (36.7 C) 98.8 F (37.1 C)  98.7 F (37.1 C)  TempSrc: Oral Oral  Axillary  SpO2: 96% 99%  100%  Weight:  60.2 kg    Height:        Intake/Output Summary (Last 24 hours) at 07/20/2020 1126 Last data filed at 07/20/2020 07/22/2020 Gross per 24 hour  Intake 2585.92 ml  Output 4025 ml  Net -1439.08 ml   Filed Weights   07/18/20 0400 07/19/20 0400 07/20/20 0326  Weight: 62.3 kg 60.1 kg 60.2 kg    Exam:   General: Appearance:    elderly female in no acute distress     Lungs:      respirations unlabored  Heart:    Normal heart  rate.   MS:   All extremities are intact.  Legs in UNNA boots  Neurologic:   Awake, alert, pleasant and cooperative    Data Reviewed:   I have personally reviewed following labs and imaging studies:  Labs: Labs show the following:   Basic Metabolic Panel: Recent Labs  Lab 07/14/20 1650 07/14/20 2141 07/15/20 0657 07/15/20 1637 07/15/20 2058 07/16/20 0538 07/16/20 0926 07/17/20 0024 07/17/20 [id number] 07/18/20 0036 07/18/20 0439 07/18/20 0755 07/19/20 0031 07/20/20 0109  NA 116*   < > 121* 120*   < > 120*   < > 121*   < > 125* 126* 126* 126* 128*  K  --   --  4.3  --   --  4.5  --  4.4  --   --  4.2  --  3.4* 3.7  CL  --   --  90*  --   --  90*  --  91*  --   --  94*  --  94* 95*  CO2  --   --  26  --   --  25  --  26  --   --  25  --  27 27  GLUCOSE  --   --  206*  --   --  189*  --   208*  --   --  107*  --  84 182*  BUN  --   --  6*  --   --  5*  --  5*  --   --  5*  --  5* 9  CREATININE  --   --  0.38*  --   --  0.33*  --  <0.30*  --   --  0.31*  --  0.33* 0.40*  CALCIUM  --   --  7.9*  --   --  7.8*  --  7.7*  --   --  8.3*  --  8.3* 8.1*  MG 2.1  --  1.9 2.0  --  1.8  --   --   --   --   --   --   --  1.6*  PHOS 2.4*  --  1.4* 3.0  --  2.4*  --   --   --   --   --   --   --  2.9   < > = values in this interval not displayed.   GFR Estimated Creatinine Clearance: 43.7 mL/min (A) (by C-G formula based on SCr of 0.4 mg/dL (L)). Liver Function Tests: Recent Labs  Lab 07/13/20 1138  AST 49*  ALT 16  ALKPHOS 55  BILITOT 1.2  PROT 3.2*  ALBUMIN 1.7*   No results for input(s): LIPASE, AMYLASE in the last 168 hours. No results for input(s): AMMONIA in the last 168 hours. Coagulation profile No results for input(s): INR, PROTIME in the last 168 hours.  CBC: Recent Labs  Lab 07/16/20 0538 07/17/20 0024 07/17/20 1613 07/18/20 0439 07/19/20 0031 07/20/20 0109  WBC 6.3 5.5  --  7.2 7.0 5.8  HGB 7.4* 6.7* 9.8* 9.2* 8.7* 8.5*  HCT 23.0* 20.5* 29.5* 28.9* 26.9* 26.8*  MCV 79.6* 80.1  --  81.6 82.0 83.0  PLT 198 172  --  169 187 196   Cardiac Enzymes: No results for input(s): CKTOTAL, CKMB, CKMBINDEX, TROPONINI in the last 168 hours. BNP (last 3 results) No results for input(s): PROBNP in the last 8760 hours. CBG: Recent Labs  Lab 07/19/20 2008 07/19/20  2322 07/20/20 0327 07/20/20 0749 07/20/20 1107  GLUCAP 234* 212* 183* 165* 141*   D-Dimer: No results for input(s): DDIMER in the last 72 hours. Hgb A1c: No results for input(s): HGBA1C in the last 72 hours. Lipid Profile: No results for input(s): CHOL, HDL, LDLCALC, TRIG, CHOLHDL, LDLDIRECT in the last 72 hours. Thyroid function studies: No results for input(s): TSH, T4TOTAL, T3FREE, THYROIDAB in the last 72 hours.  Invalid input(s): FREET3 Anemia work up: Recent Labs    07/20/20 0109  TIBC  284  IRON 24*   Sepsis Labs: Recent Labs  Lab 07/13/20 2117 07/14/20 0647 07/17/20 0024 07/18/20 0439 07/19/20 0031 07/20/20 0109  PROCALCITON <0.10  --   --   --   --   --   WBC  --    < > 5.5 7.2 7.0 5.8  LATICACIDVEN 0.8  --   --   --   --   --    < > = values in this interval not displayed.    Microbiology Recent Results (from the past 240 hour(s))  SARS CORONAVIRUS 2 (TAT 6-24 HRS) Nasopharyngeal Nasopharyngeal Swab     Status: None   Collection Time: 07/13/20  3:13 PM   Specimen: Nasopharyngeal Swab  Result Value Ref Range Status   SARS Coronavirus 2 NEGATIVE NEGATIVE Final    Comment: (NOTE) SARS-CoV-2 target nucleic acids are NOT DETECTED.  The SARS-CoV-2 RNA is generally detectable in upper and lower respiratory specimens during the acute phase of infection. Negative results do not preclude SARS-CoV-2 infection, do not rule out co-infections with other pathogens, and should not be used as the sole basis for treatment or other patient management decisions. Negative results must be combined with clinical observations, patient history, and epidemiological information. The expected result is Negative.  Fact Sheet for Patients: HairSlick.no  Fact Sheet for Healthcare Providers: quierodirigir.com  This test is not yet approved or cleared by the [place] FDA and  has been authorized for detection and/or diagnosis of SARS-CoV-2 by FDA under an Emergency Use Authorization (EUA). This EUA will remain  in effect (meaning this test can be used) for the duration of the COVID-19 declaration under Se ction 564(b)(1) of the Act, 21 U.S.C. section 360bbb-3(b)(1), unless the authorization is terminated or revoked sooner.  Performed at [facility], [address], [place], [place] [address]   MRSA PCR Screening     Status: None   Collection Time: 07/13/20  8:43 PM   Specimen: Nasopharyngeal  Result Value  Ref Range Status   MRSA by PCR NEGATIVE NEGATIVE Final    Comment:        The GeneXpert MRSA Assay (FDA approved for NASAL specimens only), is one component of a comprehensive MRSA colonization surveillance program. It is not intended to diagnose MRSA infection nor to guide or monitor treatment for MRSA infections. Performed at [facility], [address], [place], [place] [address]   Culture, blood (routine x 2)     Status: Abnormal   Collection Time: 07/13/20  9:14 PM   Specimen: BLOOD  Result Value Ref Range Status   Specimen Description BLOOD BLOOD RIGHT FOREARM  Final   Special Requests   Final    BOTTLES DRAWN AEROBIC ONLY Blood Culture results may not be optimal due to an inadequate volume of blood received in culture bottles   Culture  Setup Time   Final    GRAM POSITIVE COCCI IN CLUSTERS AEROBIC BOTTLE ONLY CRITICAL RESULT  CALLED TO, READ BACK BY AND VERIFIED WITH: Peter Minium PHARMD 1252 07/15/20 A BROWNING    Culture (A)  Final    STAPHYLOCOCCUS EPIDERMIDIS THE SIGNIFICANCE OF ISOLATING THIS ORGANISM FROM A SINGLE SET OF BLOOD CULTURES WHEN MULTIPLE SETS ARE DRAWN IS UNCERTAIN. PLEASE NOTIFY THE MICROBIOLOGY DEPARTMENT WITHIN ONE WEEK IF SPECIATION AND SENSITIVITIES ARE REQUIRED. Performed at [facility], [address] [place], [place] [address]    Report Status 07/16/2020 FINAL  Final  Blood Culture ID Panel (Reflexed)     Status: Abnormal   Collection Time: 07/13/20  9:14 PM  Result Value Ref Range Status   Enterococcus faecalis NOT DETECTED NOT DETECTED Final   Enterococcus Faecium NOT DETECTED NOT DETECTED Final   Listeria monocytogenes NOT DETECTED NOT DETECTED Final   Staphylococcus species DETECTED (A) NOT DETECTED Final    Comment: CRITICAL RESULT CALLED TO, READ BACK BY AND VERIFIED WITH: Peter Minium PHARMD 1252 07/15/20 A BROWNING    Staphylococcus aureus (BCID) NOT DETECTED NOT DETECTED Final   Staphylococcus epidermidis DETECTED (A) NOT DETECTED  Final    Comment: Methicillin (oxacillin) resistant coagulase negative staphylococcus. Possible blood culture contaminant (unless isolated from more than one blood culture draw or clinical case suggests pathogenicity). No antibiotic treatment is indicated for blood  culture contaminants. CRITICAL RESULT CALLED TO, READ BACK BY AND VERIFIED WITH: Peter Minium PHARMD 1252 07/15/20 A BROWNING    Staphylococcus lugdunensis NOT DETECTED NOT DETECTED Final   Streptococcus species NOT DETECTED NOT DETECTED Final   Streptococcus agalactiae NOT DETECTED NOT DETECTED Final   Streptococcus pneumoniae NOT DETECTED NOT DETECTED Final   Streptococcus pyogenes NOT DETECTED NOT DETECTED Final   A.calcoaceticus-baumannii NOT DETECTED NOT DETECTED Final   Bacteroides fragilis NOT DETECTED NOT DETECTED Final   Enterobacterales NOT DETECTED NOT DETECTED Final   Enterobacter cloacae complex NOT DETECTED NOT DETECTED Final   Escherichia coli NOT DETECTED NOT DETECTED Final   Klebsiella aerogenes NOT DETECTED NOT DETECTED Final   Klebsiella oxytoca NOT DETECTED NOT DETECTED Final   Klebsiella pneumoniae NOT DETECTED NOT DETECTED Final   Proteus species NOT DETECTED NOT DETECTED Final   Salmonella species NOT DETECTED NOT DETECTED Final   Serratia marcescens NOT DETECTED NOT DETECTED Final   Haemophilus influenzae NOT DETECTED NOT DETECTED Final   Neisseria meningitidis NOT DETECTED NOT DETECTED Final   Pseudomonas aeruginosa NOT DETECTED NOT DETECTED Final   Stenotrophomonas maltophilia NOT DETECTED NOT DETECTED Final   Candida albicans NOT DETECTED NOT DETECTED Final   Candida auris NOT DETECTED NOT DETECTED Final   Candida glabrata NOT DETECTED NOT DETECTED Final   Candida krusei NOT DETECTED NOT DETECTED Final   Candida parapsilosis NOT DETECTED NOT DETECTED Final   Candida tropicalis NOT DETECTED NOT DETECTED Final   Cryptococcus neoformans/gattii NOT DETECTED NOT DETECTED Final   Methicillin resistance  mecA/C DETECTED (A) NOT DETECTED Final    Comment: CRITICAL RESULT CALLED TO, READ BACK BY AND VERIFIED WITHPeter Minium PHARMD 1252 07/15/20 A BROWNING Performed at [facility], [address], [place], [place] [address]   Culture, Urine     Status: None   Collection Time: 07/13/20  9:20 PM   Specimen: Urine, Random  Result Value Ref Range Status   Specimen Description URINE, RANDOM  Final   Special Requests NONE  Final   Culture   Final    NO GROWTH Performed at [facility], [address] [place], [place] [address]  Report Status 07/15/2020 FINAL  Final  Culture, blood (routine x 2)     Status: Abnormal   Collection Time: 07/13/20  9:23 PM   Specimen: BLOOD  Result Value Ref Range Status   Specimen Description BLOOD RIGHT ANTECUBITAL  Final   Special Requests   Final    BOTTLES DRAWN AEROBIC ONLY Blood Culture results may not be optimal due to an inadequate volume of blood received in culture bottles   Culture  Setup Time   Final    AEROBIC BOTTLE ONLY GRAM POSITIVE RODS CRITICAL RESULT CALLED TO, READ BACK BY AND VERIFIED WITH: G ABBOTT PHARMD 07/16/20 0442 JDW    Culture (A)  Final    DIPHTHEROIDS(CORYNEBACTERIUM SPECIES) Standardized susceptibility testing for this organism is not available. Performed at [facility], [address], [place], [place] [address]    Report Status 07/18/2020 FINAL  Final    Procedures and diagnostic studies:  No results found.  Medications:   . chlorhexidine  15 mL Mouth Rinse BID  . Chlorhexidine Gluconate Cloth  6 each Topical Q0600  . feeding supplement  237 mL Oral BID BM  . feeding supplement (OSMOLITE 1.5 CAL)  840 mL Per Tube Q24H  . feeding supplement (PROSource TF)  45 mL Per Tube BID  . free water  30 mL Per Tube Q4H  . furosemide  40 mg Intravenous Q12H  . heparin  5,000 Units Subcutaneous Q8H  . insulin aspart  0-20 Units Subcutaneous Q4H  . levothyroxine  25 mcg Oral Q0600  . mouth rinse  15 mL Mouth  Rinse q12n4p  . memantine  10 mg Oral Daily  . multivitamin  15 mL Oral Daily  . pantoprazole (PROTONIX) IV  40 mg Intravenous Q24H   Continuous Infusions: . sodium chloride 50 mL/hr at 07/15/20 1036  . sodium chloride 50 mL/hr at 07/20/20 0326     LOS: 7 days   Joseph Art  [facility]   How to contact the [facility] Attending or Consulting provider 7A - 7P or covering provider during after hours 7P -7A, for this patient?  1. Check the care team in [facility] and look for a) attending/consulting [facility] provider listed and b) the [facility] team listed 2. Log into [email or web address] and use [place]'s universal password to access. If you do not have the password, please contact the hospital operator. 3. Locate the [facility] provider you are looking for under [facility] and page to a number that you can be directly reached. 4. If you still have difficulty reaching the provider, please page the [facility] (Director on Call) for the Hospitalists listed on amion for assistance.  07/20/2020, 11:26 AM

## 2020-07-20 NOTE — Plan of Care (Signed)

## 2020-07-20 NOTE — Progress Notes (Signed)
Physical Therapy Treatment Patient Details Name: Suzanne Fox MRN: [id number] DOB: February 21, 1930 Today's Date: 07/20/2020    History of Present Illness 87 yo female presents to ED on 4/19 from [facility] with lethargy, AMS, hypotension, hyponatremia. PMH significant for HTN, DM2, dementia, tobacco abuse, PAD, OA, recent PNA nad UTI, and recent admission 3/17-3/25 with spontaneous epidural hematoma requiring C spine decompression and fusion.    PT Comments    Pt received in supine and asleep but easily awoken and agreeable to therapy session and with good participation for bed mobility and seated balance activities. Pt continues to demonstrate weak core strength and able to tolerate sitting EOB ~5 minutes with assist needed for upright posture prior to c/o severe fatigue. Pt performed supine BLE AAROM therapeutic exercises as detailed below with good tolerance, may consider bringing HEP handout to reinforce movements next session. Pt continues to benefit from PT services to progress toward functional mobility goals. Continue to recommend SNF.  Follow Up Recommendations  SNF     Equipment Recommendations  None recommended by PT    Recommendations for Other Services       Precautions / Restrictions Precautions Precautions: Fall Precaution Comments: cortrak Restrictions Weight Bearing Restrictions: No    Mobility  Bed Mobility Overal bed mobility: Needs Assistance Bed Mobility: Sidelying to Sit;Rolling;Sit to Sidelying Rolling: Max assist Sidelying to sit: Max assist;HOB elevated     Sit to sidelying: Max assist General bed mobility comments: max assist for supine<>sit for trunk and LE management, scooting to/from EOB, and boost up in bed upon return to supine. pt benefits from multimodal cues    Transfers   General transfer comment: unable, pt fatigued after sitting EOB ~5 minutes and poor trunk control throughout  Ambulation/Gait          Balance Overall  balance assessment: Needs assistance Sitting-balance support: Feet supported;Bilateral upper extremity supported Sitting balance-Leahy Scale: Poor Sitting balance - Comments: requiring varying min guard assist to maxA due to posterolateral lean to L side, pt able to lean more to R side when given cues to prop toward R elbow but does not maintain upright without frequent cues Postural control: Posterior lean      Cognition Arousal/Alertness: Awake/alert Behavior During Therapy: WFL for tasks assessed/performed Overall Cognitive Status: History of cognitive impairments - at baseline                                 General Comments: history of dementia; Oriented to self and year but not otherwise oriented; Pt follows commands well, but is an unreliable historian during subjective portion of exam. Pt is very pleasant throughout session.      Exercises General Exercises - Lower Extremity Ankle Circles/Pumps: AAROM;Both;10 reps;Supine (needs tcs for completion) Short Arc Quad: AAROM;Both;10 reps;Supine Long Arc Quad: Both;10 reps;Seated;AROM (partial ROM) Heel Slides: AAROM;Both;10 reps;Supine;AROM (some AROM but needs AA to keep LE in neutral position) Hip ABduction/ADduction: AAROM;Both;10 reps;Supine (needs more assist on LLE than on RLE) Hip Flexion/Marching:  (pt cued to attempt while seated but unable; weak hip flexors vs cognition?)    General Comments General comments (skin integrity, edema, etc.): supine/resting BP: 139/64 (84), SpO2 100% on RA, HR 74; supine/post-exertion: BP 153/60 (80); HR 79 bpm SpO2 100% on RA; VSS on RA with seated activity      Pertinent Vitals/Pain Pain Assessment: Faces Faces Pain Scale: Hurts a little bit Pain Location: pt unable to localize  but grimaces during bed mobility Pain Descriptors / Indicators: Grimacing Pain Intervention(s): Monitored during session;Repositioned           PT Goals (current goals can now be found in the  care plan section) Acute Rehab PT Goals Patient Stated Goal: none stated PT Goal Formulation: With patient Time For Goal Achievement: 07/30/20 Potential to Achieve Goals: Fair Progress towards PT goals: Progressing toward goals    Frequency    Min 2X/week      PT Plan Current plan remains appropriate       AM-PAC PT "6 Clicks" Mobility   Outcome Measure  Help needed turning from your back to your side while in a flat bed without using bedrails?: A Lot Help needed moving from lying on your back to sitting on the side of a flat bed without using bedrails?: A Lot Help needed moving to and from a bed to a chair (including a wheelchair)?: Total Help needed standing up from a chair using your arms (e.g., wheelchair or bedside chair)?: Total Help needed to walk in hospital room?: Total Help needed climbing 3-5 steps with a railing? : Total 6 Click Score: 8    End of Session   Activity Tolerance: Patient tolerated treatment well;Patient limited by fatigue Patient left: in bed;with call bell/phone within reach;with bed alarm set;Other (comment) (B prevalons donned and heels floated, pillows under elbows and bed in chair position, RN notified her lunch tray arrived and she wants to eat) Nurse Communication: Mobility status PT Visit Diagnosis: Other abnormalities of gait and mobility (R26.89);Muscle weakness (generalized) (M62.81)     Time: 0109-3235 PT Time Calculation (min) (ACUTE ONLY): 28 min  Charges:  $Therapeutic Exercise: 8-22 mins $Therapeutic Activity: 8-22 mins                     Tyreesha Maharaj P., PTA Acute Rehabilitation Services Pager: [phone] Office: [phone]   [place] 07/20/2020, 12:58 PM

## 2020-07-21 DIAGNOSIS — E871 Hypo-osmolality and hyponatremia: Secondary | ICD-10-CM | POA: Diagnosis not present

## 2020-07-21 LAB — GLUCOSE, CAPILLARY
Glucose-Capillary: 231 mg/dL — ABNORMAL HIGH (ref 70–99)
Glucose-Capillary: 199 mg/dL — ABNORMAL HIGH (ref 70–99)
Glucose-Capillary: 153 mg/dL — ABNORMAL HIGH (ref 70–99)
Glucose-Capillary: 148 mg/dL — ABNORMAL HIGH (ref 70–99)
Glucose-Capillary: 157 mg/dL — ABNORMAL HIGH (ref 70–99)
Glucose-Capillary: 243 mg/dL — ABNORMAL HIGH (ref 70–99)

## 2020-07-21 LAB — CBC WITH DIFFERENTIAL/PLATELET
Abs Immature Granulocytes: 0.02 10*3/uL (ref 0.00–0.07)
Basophils Absolute: 0 10*3/uL (ref 0.0–0.1)
Basophils Relative: 1 %
Eosinophils Absolute: 0.4 10*3/uL (ref 0.0–0.5)
Eosinophils Relative: 7 %
HCT: 27.6 % — ABNORMAL LOW (ref 36.0–46.0)
Hemoglobin: 8.5 g/dL — ABNORMAL LOW (ref 12.0–15.0)
Immature Granulocytes: 0 %
Lymphocytes Relative: 18 %
Lymphs Abs: 1 10*3/uL (ref 0.7–4.0)
MCH: 26 pg (ref 26.0–34.0)
MCHC: 30.8 g/dL (ref 30.0–36.0)
MCV: 84.4 fL (ref 80.0–100.0)
Monocytes Absolute: 0.8 10*3/uL (ref 0.1–1.0)
Monocytes Relative: 14 %
Neutro Abs: 3.3 10*3/uL (ref 1.7–7.7)
Neutrophils Relative %: 60 %
Platelets: 203 10*3/uL (ref 150–400)
RBC: 3.27 MIL/uL — ABNORMAL LOW (ref 3.87–5.11)
RDW: 16.1 % — ABNORMAL HIGH (ref 11.5–15.5)
WBC: 5.5 10*3/uL (ref 4.0–10.5)
nRBC: 0 % (ref 0.0–0.2)

## 2020-07-21 LAB — COMPREHENSIVE METABOLIC PANEL
ALT: 33 U/L (ref 0–44)
AST: 37 U/L (ref 15–41)
Albumin: 2.2 g/dL — ABNORMAL LOW (ref 3.5–5.0)
Alkaline Phosphatase: 79 U/L (ref 38–126)
Anion gap: 5 (ref 5–15)
BUN: 14 mg/dL (ref 8–23)
CO2: 30 mmol/L (ref 22–32)
Calcium: 8.3 mg/dL — ABNORMAL LOW (ref 8.9–10.3)
Chloride: 96 mmol/L — ABNORMAL LOW (ref 98–111)
Creatinine, Ser: 0.38 mg/dL — ABNORMAL LOW (ref 0.44–1.00)
GFR, Estimated: >60 mL/min (ref >60)
Glucose, Bld: 198 mg/dL — ABNORMAL HIGH (ref 70–99)
Potassium: 3.5 mmol/L (ref 3.5–5.1)
Sodium: 131 mmol/L — ABNORMAL LOW (ref 135–145)
Total Bilirubin: 0.5 mg/dL (ref 0.3–1.2)
Total Protein: 4.7 g/dL — ABNORMAL LOW (ref 6.5–8.1)

## 2020-07-21 MED ORDER — SODIUM CHLORIDE 0.9 % IV SOLN
510.0000 mg | INTRAVENOUS | Status: DC
  Administered 2020-07-21: 510 mg via INTRAVENOUS
  Filled 2020-07-21: qty 17

## 2020-07-21 NOTE — Plan of Care (Signed)

## 2020-07-21 NOTE — Progress Notes (Signed)
PROGRESS NOTE   Suzanne Fox  BDZ:[id number] DOB: December 20, 1929 DOA: 07/13/2020 PCP: Georgianne Fick, MD  Brief Narrative:   80 white female [facility] resident DM TY 2, HTN S/p cervical surgery-ex vacuo hematoma 3/17-->3/25  Present [facility] ED 07/13/2020 lethargy bradycardia hypotension-initial sodium 110, K5.4-was on chlorthalidone PTA Hemoglobin 6.5 albumin 1.7 Nephrology consulted-felt combination T toast potomania chlorthalidone effect/+ third spacing Rx eventually 3% saline GI consulted-not a candidate for EGD/colonoscopy was heme positive on stool Positive blood cultures were contaminant  Hospital-Problem based course  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, SIADH, Chlorthalidone Previously received 3% saline Appreciate nephrology input Continue tube feeds Discontinue IV fluid, aggressive Sodium checks discontinued Lasix discontinued Recent hematoma evacuation from cervical surgery Anemia of iron deficiency 1 unit PRBC 4/23 Will give Feraheme x1 Despite positive heme on stool is not a candidate per GI for EGD colonoscopy Hypothyroidism Continue Synthroid 25 mcg daily HTN- Stopped HCTZ this admission allow blood pressure to be as high as 160 and then reevaluate as outpatient Severe malnutrition Eating 25 to 50% of meals Calorie count in progress Has core track which we will continue until evaluation of calorie deficits Stage III-IV dementia Continue Namenda 10 mg daily Will discuss implications of patient's malnutrition once we have a clear idea as to how much calories she is taking in-she is not really a great candidate for PEG tube placement given advanced age etc.   DVT prophylaxis: Heparin Code Status: DNR Family Communication:  Laruth Bouchard Niece   [phone]  POA is sister Disposition:  Status is: Inpatient  Remains inpatient appropriate because:Hemodynamically unstable, Persistent severe electrolyte disturbances, Ongoing active pain  requiring inpatient pain management and Unsafe d/c plan   Dispo: The patient is from: Home              Anticipated d/c is to: SNF              Patient currently is not medically stable to d/c.   Difficult to place patient Yes    Consultants:   Nephrology  Procedures:   Antimicrobials: none    Subjective:  Awake alert somewhat incoherent but can tell me the place and time  Objective: Vitals:   07/21/20 0600 07/21/20 0713 07/21/20 0800 07/21/20 1109  BP:  (!) 147/54 (!) 127/54 (!) 137/53  Pulse:  83 86 77  Resp:  18 16 17   Temp:  97.8 F (36.6 C)  97.8 F (36.6 C)  TempSrc:  Oral  Oral  SpO2:  98% 99% 99%  Weight: 60.2 kg     Height:        Intake/Output Summary (Last 24 hours) at 07/21/2020 1442 Last data filed at 07/21/2020 1200 Gross per 24 hour  Intake 1590.48 ml  Output 3100 ml  Net -1509.52 ml   Filed Weights   07/19/20 0400 07/20/20 0326 07/21/20 0600  Weight: 60.1 kg 60.2 kg 60.2 kg    Examination:  eomi ncat no focal deficit Frail cachectic-cta b abd soft nt nd no rebound no guard No LE edema Moving 4 limbs -no LE Edema   Data Reviewed: personally reviewed   CBC    Component Value Date/Time   WBC 5.5 07/21/2020 0723   RBC 3.27 (L) 07/21/2020 0723   HGB 8.5 (L) 07/21/2020 0723   HCT 27.6 (L) 07/21/2020 0723   PLT 203 07/21/2020 0723   MCV 84.4 07/21/2020 0723   MCH 26.0 07/21/2020 0723   MCHC 30.8 07/21/2020 0723   RDW 16.1 (H) 07/21/2020  0723   LYMPHSABS 1.0 07/21/2020 0723   MONOABS 0.8 07/21/2020 0723   EOSABS 0.4 07/21/2020 0723   BASOSABS 0.0 07/21/2020 0723   CMP Latest Ref Rng & Units 07/21/2020 07/20/2020 07/19/2020  Glucose 70 - 99 mg/dL 500(B) 704(U) 84  BUN 8 - 23 mg/dL 14 9 5(L)  Creatinine 8.89 - 1.00 mg/dL 1.69(I) 5.03(U) 8.82(C)  Sodium 135 - 145 mmol/L 131(L) 128(L) 126(L)  Potassium 3.5 - 5.1 mmol/L 3.5 3.7 3.4(L)  Chloride 98 - 111 mmol/L 96(L) 95(L) 94(L)  CO2 22 - 32 mmol/L 30 27 27   Calcium 8.9 - 10.3 mg/dL  8.3(L) 8.1(L) 8.3(L)  Total Protein 6.5 - 8.1 g/dL 4.7(L) - -  Total Bilirubin 0.3 - 1.2 mg/dL 0.5 - -  Alkaline Phos 38 - 126 U/L 79 - -  AST 15 - 41 U/L 37 - -  ALT 0 - 44 U/L 33 - -   Radiology Studies: No results found.  Scheduled Meds: . chlorhexidine  15 mL Mouth Rinse BID  . Chlorhexidine Gluconate Cloth  6 each Topical Q0600  . feeding supplement  237 mL Oral BID BM  . feeding supplement (OSMOLITE 1.5 CAL)  840 mL Per Tube Q24H  . feeding supplement (PROSource TF)  45 mL Per Tube BID  . free water  30 mL Per Tube Q4H  . heparin  5,000 Units Subcutaneous Q8H  . insulin aspart  0-20 Units Subcutaneous Q4H  . levothyroxine  25 mcg Oral Q0600  . mouth rinse  15 mL Mouth Rinse q12n4p  . memantine  10 mg Oral Daily  . pantoprazole (PROTONIX) IV  40 mg Intravenous Q24H   Continuous Infusions: . sodium chloride 50 mL/hr at 07/15/20 1036  . sodium chloride 50 mL/hr at 07/21/20 1200  . ferumoxytol       LOS: 8 days   Time spent: 63  41, MD [facility] To contact the attending provider between 7A-7P or the covering provider during after hours 7P-7A, please log into the web site [email or web address] and access using universal [place] password for that web site. If you do not have the password, please call the hospital operator.  07/21/2020, 2:42 PM

## 2020-07-21 NOTE — Progress Notes (Signed)
Nutrition Follow Up Note   DOCUMENTATION CODES:   Non-severe (moderate) malnutrition in context of chronic illness  INTERVENTION:   RD placed calorie count envelope on the patient's door. Nursing to document percent consumed for each item on the patient's meal tray ticket and keep in envelope.   Continue nocturnal feeds via Cortrak:  -Osmolite 1.5 @ 70 ml/hr x 12 hours (1800-0600)  -Pro-source TF 9ml BID -Free water flushes 30 ml Q4 hours   Regimen provides 1340kcal/day, 75g/day protein and 869ml/day free water (1000 ml with flushes). Meets 86% kcal needs and 94% protein needs.    Ensure Enlive po BID, each supplement provides 350 kcal and 20 grams of protein  Magic cup TID with meals, each supplement provides 290 kcal and 9 grams of protein  NUTRITION DIAGNOSIS:   Moderate Malnutrition related to chronic illness (dementia) as evidenced by moderate fat depletion,severe muscle depletion.  Ongoing  GOAL:   Patient will meet greater than or equal to 90% of their needs   Addressed via TF  MONITOR:   PO intake,Supplement acceptance,Labs,Weight trends,TF tolerance,Skin,I & O's  ASSESSMENT:   87 year old female who presented to the ED on 4/19 from [facility] with AMS and hypotension. PMH of dementia, HTN, T2DM, and recent cervical surgery for hematoma. Pt admitted with hyponatremia in the setting of suspected dehydration/poor nutrition and use of chlorthalidone as well as acute metabolic encephalopathy.   4/20 - Cortrak placed, tip at the pylorus per abdominal x-ray  Intake remains minimal. Per patient, she consumed eggs with sausage this am without complication. RD observed Ensure Enlive at bedside 75% completed. Last three meal completions charted as 12%, 25%, 35%, and 20%. Continue nocturnal tube feedings until patient demonstrates consistent intake >50%.   Calorie count to start today at lunch and end 4/29 at lunch. RD to collect and calculate meal tickets  in envelope.   Admission weight: 61.2 kg  Current weight: 60.2 kg   UOP: 3100 ml x 24 hrs   Drips: NS @ 50 ml/hr  Medications: 40 mg lasix BID, SS novolog Labs: Na 131 (L) CBG 141-301 Mg 1.6 (L)  Diet Order:   Diet Order            DIET - DYS 1 Room service appropriate? Yes; Fluid consistency: Thin  Diet effective now                EDUCATION NEEDS:   Not appropriate for education at this time  Skin:  Skin Assessment: Reviewed RN Assessment  Last BM:  4/23  Height:   Ht Readings from Last 1 Encounters:  07/13/20 5\' 5"  (1.651 m)    Weight:   Wt Readings from Last 1 Encounters:  07/21/20 60.2 kg    Ideal Body Weight:  56.8 kg  BMI:  Body mass index is 22.09 kg/m.  Estimated Nutritional Needs:   Kcal:  1550-1750 kcal  Protein:  80-95 grams  Fluid:  >/= 1.5 L/day  07/23/20 RD, LDN Clinical Nutrition Pager listed in AMION

## 2020-07-21 NOTE — Progress Notes (Signed)
Phoned [facility], [place] Cath replaced 07/09/2020

## 2020-07-21 NOTE — Progress Notes (Signed)
Inpatient Diabetes Program Recommendations  AACE/ADA: New Consensus Statement on Inpatient Glycemic Control (2015)  Target Ranges:  Prepandial:   less than 140 mg/dL      Peak postprandial:   less than 180 mg/dL (1-2 hours)      Critically ill patients:  140 - 180 mg/dL   Lab Results  Component Value Date   GLUCAP 199 (H) 07/21/2020   HGBA1C 6.8 (H) 07/13/2020    Review of Glycemic Control Results for Suzanne Fox, Suzanne Fox (MRN [id number]) as of 07/21/2020 09:56  Ref. Range 07/20/2020 19:55 07/20/2020 23:49 07/21/2020 04:17 07/21/2020 07:14  Glucose-Capillary Latest Ref Range: 70 - 99 mg/dL 716 (H) 967 (H) 893 (H) 199 (H)    Current orders for Inpatient glycemic control:  Novolog 0-20 units Q4H Osmolite 1.5 @ 70 ml/hr overnight only  Inpatient Diabetes Program Recommendations:    Please consider Novolog tube feed coverage 2-3 units Q4H overnight while tube feeds running (stop if feeds are discontinued or held).  Will continue to follow while inpatient.  Thank you, Dulce Sellar, RN, BSN Diabetes Coordinator Inpatient Diabetes Program [phone] (team pager from 8a-5p)

## 2020-07-21 NOTE — Hospital Course (Addendum)
   Total intake: 1206 kcal (78% of minimum estimated needs)  72 protein (90% of minimum estimated needs)

## 2020-07-22 DIAGNOSIS — E871 Hypo-osmolality and hyponatremia: Secondary | ICD-10-CM | POA: Diagnosis not present

## 2020-07-22 LAB — RENAL FUNCTION PANEL
Albumin: 2.2 g/dL — ABNORMAL LOW (ref 3.5–5.0)
Anion gap: 7 (ref 5–15)
BUN: 18 mg/dL (ref 8–23)
CO2: 27 mmol/L (ref 22–32)
Calcium: 8.5 mg/dL — ABNORMAL LOW (ref 8.9–10.3)
Chloride: 95 mmol/L — ABNORMAL LOW (ref 98–111)
Creatinine, Ser: 0.41 mg/dL — ABNORMAL LOW (ref 0.44–1.00)
GFR, Estimated: >60 mL/min (ref >60)
Glucose, Bld: 264 mg/dL — ABNORMAL HIGH (ref 70–99)
Phosphorus: 2.7 mg/dL (ref 2.5–4.6)
Potassium: 3.1 mmol/L — ABNORMAL LOW (ref 3.5–5.1)
Sodium: 129 mmol/L — ABNORMAL LOW (ref 135–145)

## 2020-07-22 LAB — GLUCOSE, CAPILLARY
Glucose-Capillary: 162 mg/dL — ABNORMAL HIGH (ref 70–99)
Glucose-Capillary: 169 mg/dL — ABNORMAL HIGH (ref 70–99)
Glucose-Capillary: 191 mg/dL — ABNORMAL HIGH (ref 70–99)
Glucose-Capillary: 199 mg/dL — ABNORMAL HIGH (ref 70–99)
Glucose-Capillary: 247 mg/dL — ABNORMAL HIGH (ref 70–99)
Glucose-Capillary: 249 mg/dL — ABNORMAL HIGH (ref 70–99)

## 2020-07-22 LAB — MAGNESIUM: Magnesium: 1.8 mg/dL (ref 1.7–2.4)

## 2020-07-22 MED ORDER — POTASSIUM CHLORIDE CRYS ER 20 MEQ PO TBCR
40.0000 meq | EXTENDED_RELEASE_TABLET | Freq: Every day | ORAL | Status: DC
  Administered 2020-07-22 – 2020-07-23 (×2): 40 meq via ORAL
  Filled 2020-07-22 (×2): qty 2

## 2020-07-22 MED ORDER — FUROSEMIDE 20 MG PO TABS
10.0000 mg | ORAL_TABLET | Freq: Every day | ORAL | Status: DC
  Administered 2020-07-22 – 2020-08-03 (×13): 10 mg via ORAL
  Filled 2020-07-22 (×13): qty 1

## 2020-07-22 NOTE — Progress Notes (Signed)
Calorie Count Note  48 hour calorie count ordered.  Diet: Dysphagia 1, thin liquids  Supplements: Ensure Enlive BID, Magic Cup TID  Day 1 Lunch: 181 kcal, 18 g protein Dinner: 100 kcal, 8 g protein Breakfast: 275 kcal, 17 g protein Supplements: 650 kcal, 29 g protein  Total intake: 1206 kcal (78% of minimum estimated needs)  72 protein (90% of minimum estimated needs)  Estimated Nutritional Needs:  Kcal:  1550-1750 kcal Protein:  80-95 grams Fluid:  >/= 1.5 L/day  Nutrition Dx: Moderate Malnutrition related to chronic illness (dementia) as evidenced by moderate fat depletion,severe muscle depletion- Ongoing  Goal:  Patient will meet greater than or equal to 90% of their needs - Addressed via TF and PO  Intervention: Meal intake progressing slowly but sill remain inadequate. Continue nocturnal feedings for tonight. Encourage meal and supplement intake. Follow for final day results calorie count tomorrow.    Ensure Enlive po BID, each supplement provides 350 kcal and 20 grams of protein  Magic cup TID with meals, each supplement provides 290 kcal and 9 grams of protein  Vanessa Kick RD, LDN Clinical Nutrition Pager listed in AMION

## 2020-07-22 NOTE — Progress Notes (Signed)
PROGRESS NOTE   Suzanne Fox  WGY:[id number] DOB: March 06, 1930 DOA: 07/13/2020 PCP: Georgianne Fick, MD  Brief Narrative:   64 white female [facility] resident DM TY 2, HTN S/p cervical surgery-ex vacuo hematoma 3/17-->3/25  Present [facility] ED 07/13/2020 lethargy bradycardia hypotension-initial sodium 110, K5.4-was on chlorthalidone PTA Hemoglobin 6.5 albumin 1.7 Nephrology consulted-felt combination T toast potomania chlorthalidone effect +/- third spacing Rx eventually 3% saline GI consulted-not a candidate for EGD/colonoscopy was heme positive on stool Positive blood cultures were contaminant  She is recovering slowly her sodium, but is in the process of a calorie count  Hospital-Problem based course  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, SIADH, Chlorthalidone Previously received 3% saline--nephrology have signed off 4/26 Discontinue IV fluid, aggressive sodium check After resume low-dose Lasix 20 mg daily given need for aquaresis May need to increase solute in diet if possible Recent hematoma evacuation from cervical surgery Anemia of iron deficiency 1 unit PRBC 4/23, Feraheme given 4/27 Despite positive heme on stool is not a candidate per GI for EGD colonoscopy Hypothyroidism Continue Synthroid 25 mcg daily HTN- Stopped HCTZ this admission allow blood pressure to be as high as 160 and then reevaluate as outpatient Severe malnutrition Eating 25 to 50% of meals Calorie count in progress-patient continues with tube feeds Has core track which we will continue until evaluation of calorie deficits Stage III-IV dementia Continue Namenda 10 mg daily Will discuss implications of patient's malnutrition once we have a clear idea as to how much calories she is taking in-she is not really a great candidate for PEG tube placement given advanced age etc.   DVT prophylaxis: Heparin Code Status: DNR Family Communication:  Laruth Bouchard Niece   [phone]  POA  is sister Disposition:  Status is: Inpatient  Remains inpatient appropriate because:Hemodynamically unstable, Persistent severe electrolyte disturbances, Ongoing active pain requiring inpatient pain management and Unsafe d/c plan   Dispo: The patient is from: Home              Anticipated d/c is to: SNF              Patient currently is not medically stable to d/c.   Difficult to place patient Yes    Consultants:   Nephrology  Procedures:   Antimicrobials: none    Subjective:  Somnolent somewhat incoherent no distress Can tell me she is in the hospital  Objective: Vitals:   07/22/20 0350 07/22/20 0719 07/22/20 1211 07/22/20 1508  BP: (!) 132/50 (!) 124/51 (!) 122/55 (!) 137/52  Pulse: 99 (!) 105  80  Resp: 19 19  19   Temp: 98.7 F (37.1 C) 99.3 F (37.4 C) 97.9 F (36.6 C) 97.9 F (36.6 C)  TempSrc: Oral Oral Oral Oral  SpO2: 99% 100%  100%  Weight: 59.9 kg     Height:        Intake/Output Summary (Last 24 hours) at 07/22/2020 1541 Last data filed at 07/22/2020 0357 Gross per 24 hour  Intake --  Output 925 ml  Net -925 ml   Filed Weights   07/20/20 0326 07/21/20 0600 07/22/20 0350  Weight: 60.2 kg 60.2 kg 59.9 kg    Examination:  eomi ncat no focal deficit Pleasant coherent Frail cachectic--has Coretrack in situ cta b abd soft nt nd no rebound no guard No LE edema Moving 4 limbs   Data Reviewed: personally reviewed   CBC    Component Value Date/Time   WBC 5.5 07/21/2020 0723   RBC 3.27 (L)  07/21/2020 0723   HGB 8.5 (L) 07/21/2020 0723   HCT 27.6 (L) 07/21/2020 0723   PLT 203 07/21/2020 0723   MCV 84.4 07/21/2020 0723   MCH 26.0 07/21/2020 0723   MCHC 30.8 07/21/2020 0723   RDW 16.1 (H) 07/21/2020 0723   LYMPHSABS 1.0 07/21/2020 0723   MONOABS 0.8 07/21/2020 0723   EOSABS 0.4 07/21/2020 0723   BASOSABS 0.0 07/21/2020 0723   CMP Latest Ref Rng & Units 07/22/2020 07/21/2020 07/20/2020  Glucose 70 - 99 mg/dL 335(K) 562(B) 638(L)  BUN 8 -  23 mg/dL 18 14 9   Creatinine 0.44 - 1.00 mg/dL ) 3.73(S) 2.87(G)  Sodium 135 - 145 mmol/L 129(L) 131(L) 128(L)  Potassium 3.5 - 5.1 mmol/L 3.1(L) 3.5 3.7  Chloride 98 - 111 mmol/L 95(L) 96(L) 95(L)  CO2 22 - 32 mmol/L 27 30 27   Calcium 8.9 - 10.3 mg/dL 8.11(X) 8.3(L) 8.1(L)  Total Protein 6.5 - 8.1 g/dL - 4.7(L) -  Total Bilirubin 0.3 - 1.2 mg/dL - 0.5 -  Alkaline Phos 38 - 126 U/L - 79 -  AST 15 - 41 U/L - 37 -  ALT 0 - 44 U/L - 33 -   Radiology Studies: No results found.  Scheduled Meds: . chlorhexidine  15 mL Mouth Rinse BID  . Chlorhexidine Gluconate Cloth  6 each Topical Q0600  . feeding supplement  237 mL Oral BID BM  . feeding supplement (OSMOLITE 1.5 CAL)  840 mL Per Tube Q24H  . feeding supplement (PROSource TF)  45 mL Per Tube BID  . free water  30 mL Per Tube Q4H  . furosemide  10 mg Oral Daily  . heparin  5,000 Units Subcutaneous Q8H  . insulin aspart  0-20 Units Subcutaneous Q4H  . levothyroxine  25 mcg Oral Q0600  . mouth rinse  15 mL Mouth Rinse q12n4p  . memantine  10 mg Oral Daily  . pantoprazole (PROTONIX) IV  40 mg Intravenous Q24H  . potassium chloride  40 mEq Oral Daily   Continuous Infusions: . sodium chloride 50 mL/hr at 07/15/20 1036  . ferumoxytol 510 mg (07/21/20 1528)     LOS: 9 days   Time spent: 20  07/17/20, MD [facility] To contact the attending provider between 7A-7P or the covering provider during after hours 7P-7A, please log into the web site [email or web address] and access using universal [place] password for that web site. If you do not have the password, please call the hospital operator.  07/22/2020, 3:41 PM

## 2020-07-22 NOTE — Progress Notes (Signed)
Inpatient Diabetes Program Recommendations  AACE/ADA: New Consensus Statement on Inpatient Glycemic Control (2015)  Target Ranges:  Prepandial:   less than 140 mg/dL      Peak postprandial:   less than 180 mg/dL (1-2 hours)      Critically ill patients:  140 - 180 mg/dL   Lab Results  Component Value Date   GLUCAP 247 (H) 07/22/2020   HGBA1C 6.8 (H) 07/13/2020    Review of Glycemic Control Results for HADIYA, SPOERL (MRN [id number]) as of 07/22/2020 10:09  Ref. Range 07/21/2020 15:13 07/21/2020 19:29 07/21/2020 23:27 07/22/2020 03:53 07/22/2020 07:21  Glucose-Capillary Latest Ref Range: 70 - 99 mg/dL 570 (H) 177 (H) 939 (H) 249 (H) 247 (H)   Diabetes history: DM 2 Current orders for Inpatient glycemic control:  Novolog 0-20 units Q4H Osmolite 1.5 @ 70 ml/hr overnight only  Inpatient Diabetes Program Recommendations:    Consider adding Levemir 8 units q HS while on tube feeds.   Thanks,  Beryl Meager, RN, BC-ADM Inpatient Diabetes Coordinator Pager [phone] (8a-5p)

## 2020-07-22 NOTE — Plan of Care (Signed)

## 2020-07-22 NOTE — Progress Notes (Signed)
Physical Therapy Treatment Patient Details Name: Suzanne Fox MRN: [id number] DOB: Jan 23, 1930 Today's Date: 07/22/2020    History of Present Illness 87 yo female presents to ED on 4/19 from [facility] with lethargy, AMS, hypotension, hyponatremia. Cortrak placed for nocturnal feeds. PMH significant for HTN, DM2, dementia, tobacco abuse, PAD, OA, recent PNA and UTI, and recent admission 3/17-3/25 with spontaneous epidural hematoma requiring C spine decompression and fusion.    PT Comments    Pt received in supine, pleasantly confused and agreeable to therapy session, with good participation for supine exercises and fair tolerance for seated balance activity. Her seated tolerance was limited due to orthostatic hypotension and bowel incontinence, RN notified. Pt continues to benefit from PT services to progress toward functional mobility goals. Continue to recommend SNF. Orthostatic BPs Supine 158/75 (99)  Sitting 126/68 (86)  After return to supine 129/49 (72)      Follow Up Recommendations  SNF     Equipment Recommendations  None recommended by PT    Recommendations for Other Services       Precautions / Restrictions Precautions Precautions: Fall Precaution Comments: cortrak Restrictions Weight Bearing Restrictions: No    Mobility  Bed Mobility Overal bed mobility: Needs Assistance Bed Mobility: Sidelying to Sit;Rolling;Sit to Sidelying Rolling: Max assist;+2 for physical assistance Sidelying to sit: Max assist;HOB elevated;+2 for physical assistance;+2 for safety/equipment     Sit to sidelying: Max assist;+2 for physical assistance General bed mobility comments: max assist for supine<>sit for trunk and LE management, scooting to/from EOB, and boost up in bed upon return to supine. pt benefits from multimodal cues; dizzy upon sitting up.    Transfers Overall transfer level: Needs assistance   Transfers: Lateral/Scoot Transfers          Lateral/Scoot  Transfers: Total assist;+2 physical assistance General transfer comment: attempted lateral scooting toward [facility] but pt not really able to assist, total A with chuck pad and +2 staff assist  Ambulation/Gait                 Stairs             Wheelchair Mobility    Modified Rankin (Stroke Patients Only)       Balance Overall balance assessment: Needs assistance Sitting-balance support: Feet supported;Bilateral upper extremity supported Sitting balance-Leahy Scale: Poor Sitting balance - Comments: requiring varying minA to maxA due to posterolateral lean to L side, and does not maintain upright without frequent cues Postural control: Posterior lean     Standing balance comment: NT, pt too dizzy and BP drop with sitting                            Cognition Arousal/Alertness: Awake/alert Behavior During Therapy: WFL for tasks assessed/performed Overall Cognitive Status: History of cognitive impairments - at baseline                                 General Comments: history of dementia; Oriented to self, not to year, can't state hospital even with hints of "where do doctors and nurses work", able to state city once hospital name was given to her, not oriented to reason for admission; Pt follows commands fairly well (~80%) but needs increased time and multimodal cues. Pt is very pleasant throughout session.      Exercises General Exercises - Lower Extremity Ankle Circles/Pumps: AAROM;Both;10 reps;Supine (needs tcs for  completion) Short Arc QuadBarbaraann Boys;Both;10 reps;Supine Heel Slides: AAROM;Both;10 reps;Supine;AROM (some AROM but needs AA to keep LE in neutral position) Hip ABduction/ADduction: AAROM;Both;10 reps;Supine (needs more assist on LLE than on RLE)    General Comments General comments (skin integrity, edema, etc.): BP 158/75 (99) supine LUE; BP 126/68 (86) seated LUE, dizzy; BP 129/49 (72) supine; SpO2 98-100% on RA, new sensor placed  due to poor pleth at times; HR 82 bpm resting and up to 107 bpm with seated exertion      Pertinent Vitals/Pain Pain Assessment: Faces Pain Location: pt unable to localize but grimaces during bed mobility Pain Descriptors / Indicators: Grimacing Pain Intervention(s): Monitored during session;Repositioned;Limited activity within patient's tolerance    Home Living                      Prior Function            PT Goals (current goals can now be found in the care plan section) Acute Rehab PT Goals Patient Stated Goal: none stated PT Goal Formulation: With patient Time For Goal Achievement: 07/30/20 Potential to Achieve Goals: Fair Progress towards PT goals: Progressing toward goals (slow progress, cognition and orthostatics limiting)    Frequency    Min 2X/week      PT Plan Current plan remains appropriate    Co-evaluation              AM-PAC PT "6 Clicks" Mobility   Outcome Measure  Help needed turning from your back to your side while in a flat bed without using bedrails?: A Lot Help needed moving from lying on your back to sitting on the side of a flat bed without using bedrails?: Total Help needed moving to and from a bed to a chair (including a wheelchair)?: Total Help needed standing up from a chair using your arms (e.g., wheelchair or bedside chair)?: Total Help needed to walk in hospital room?: Total Help needed climbing 3-5 steps with a railing? : Total 6 Click Score: 7    End of Session   Activity Tolerance: Patient tolerated treatment well;Patient limited by fatigue;Other (comment) (orthostatic sxs, RN notified; bowel incontinence) Patient left: in bed;with call bell/phone within reach;with bed alarm set;Other (comment) (Heels floated, pillows under elbows and bed in chair position, RN notified she needs geomat and sling ordered for her room to get to chair via lift later in day if possible) Nurse Communication: Mobility status;Other  (comment);Need for lift equipment (needs new sacral foam pad) PT Visit Diagnosis: Other abnormalities of gait and mobility (R26.89);Muscle weakness (generalized) (M62.81)     Time: 5400-8676 PT Time Calculation (min) (ACUTE ONLY): 32 min  Charges:  $Therapeutic Exercise: 8-22 mins $Therapeutic Activity: 8-22 mins                     Amadeo Coke P., PTA Acute Rehabilitation Services Pager: [phone] Office: [phone]   Angus [place] 07/22/2020, 1:40 PM

## 2020-07-23 DIAGNOSIS — E871 Hypo-osmolality and hyponatremia: Secondary | ICD-10-CM | POA: Diagnosis not present

## 2020-07-23 LAB — BASIC METABOLIC PANEL
Anion gap: 8 (ref 5–15)
BUN: 17 mg/dL (ref 8–23)
CO2: 27 mmol/L (ref 22–32)
Calcium: 8.8 mg/dL — ABNORMAL LOW (ref 8.9–10.3)
Chloride: 97 mmol/L — ABNORMAL LOW (ref 98–111)
Creatinine, Ser: 0.35 mg/dL — ABNORMAL LOW (ref 0.44–1.00)
GFR, Estimated: >60 mL/min (ref >60)
Glucose, Bld: 193 mg/dL — ABNORMAL HIGH (ref 70–99)
Potassium: 3.7 mmol/L (ref 3.5–5.1)
Sodium: 132 mmol/L — ABNORMAL LOW (ref 135–145)

## 2020-07-23 LAB — GLUCOSE, CAPILLARY
Glucose-Capillary: 245 mg/dL — ABNORMAL HIGH (ref 70–99)
Glucose-Capillary: 174 mg/dL — ABNORMAL HIGH (ref 70–99)
Glucose-Capillary: 169 mg/dL — ABNORMAL HIGH (ref 70–99)
Glucose-Capillary: 130 mg/dL — ABNORMAL HIGH (ref 70–99)
Glucose-Capillary: 91 mg/dL (ref 70–99)

## 2020-07-23 MED ORDER — GERHARDT'S BUTT CREAM
TOPICAL_CREAM | CUTANEOUS | Status: DC | PRN
  Filled 2020-07-23: qty 1

## 2020-07-23 NOTE — Plan of Care (Signed)

## 2020-07-23 NOTE — Progress Notes (Signed)
Calorie Count Note  48 hour calorie count ordered.  Diet: Dysphagia 1, thin liquids  Supplements: Ensure Enlive BID, Magic Cup TID  Day 1 Lunch: 181 kcal, 18 g protein Dinner: 100 kcal, 8 g protein Breakfast: 275 kcal, 17 g protein Supplements: 650 kcal, 29 g protein  Total intake: 1206 kcal (78% of minimum estimated needs)  72 protein (90% of minimum estimated needs)  Day 2 Breakfast: 254 kcals, 6 grams protein Lunch: nothing recorded Dinner: nothing recorded Supplements: 1.5 Ensure Enlive supplements (525 kcals, 30 grams protein)  Total intake: 779 kcal (50% of minimum estimated needs)  36 grams protein (45% of minimum estimated needs)  Average Total intake: 993 kcal (64% of minimum estimated needs)  54 grams protein (68% of minimum estimated needs)  Estimated Nutritional Needs: Kcal:1550-1750 kcal Protein:80-95 grams Fluid:>/= 1.5 L/day  Nutrition Dx: Moderate Malnutritionrelated to chronic illness (dementia)as evidenced by moderate fat depletion,severe muscle depletion- Ongoing  Goal:  Patient will meet greater than or equal to 90% of their needs- Addressed via TF and PO  Intervention:   -D/c calorie count -Continue Ensure Enlive po BID, each supplement provides 350 kcal and 20 grams of protein -Continue Magic cup TID with meals, each supplement provides 290 kcal and 9 grams of protein  -Continue nocturnal feeds via Cortrak:  -Osmolite 1.5 @ 70 ml/hr x 12 hours (1800-0600)  -Pro-source TF 14ml BID -Free water flushes 30 ml Q4 hours   Regimen provides 1340kcal/day, 75g/day protein and 860ml/day free water (1000 ml with flushes). Meets 86% kcal needs and 94% protein needs.   Levada Schilling, RD, LDN, CDCES Registered Dietitian II Certified Diabetes Care and Education Specialist Please refer to [facility] for RD and/or RD on-call/weekend/after hours pager

## 2020-07-23 NOTE — Progress Notes (Signed)
PROGRESS NOTE   Suzanne Fox  MWN:[id number] DOB: 05-16-1929 DOA: 07/13/2020 PCP: Georgianne Fick, MD  Brief Narrative:   60 white female [facility] care resident DM TY 2, HTN S/p cervical surgery-ex vacuo hematoma 3/17-->3/25  Present [facility] ED 07/13/2020 lethargy bradycardia hypotension-initial sodium 110, K5.4-was on chlorthalidone PTA Hemoglobin 6.5 albumin 1.7 Nephrology consulted-felt combination T toast potomania chlorthalidone effect +/- third spacing Rx eventually 3% saline GI consulted-not a candidate for EGD/colonoscopy was heme positive on stool Positive blood cultures were contaminant  She is recovering slowly her sodium---Long family discussion wuith Family deciding regarding   Hospital-Problem based course  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, SIADH, Chlorthalidone Previously received 3% saline--nephrology have signed off 4/26 low-dose Lasix 20 mg daily given need for aquaresis No further lab work Recent hematoma evacuation from cervical surgery Anemia of iron deficiency 1 unit PRBC 4/23, Feraheme given 4/27 Despite positive heme on stool is not a candidate per GI for EGD colonoscopy No further labs Hypothyroidism Will discontinue on discharge Synthroid 25 mcg daily HTN Stopped HCTZ this admission allow blood pressure to be as high as 160 and then reevaluate as outpatient Severe malnutrition Eating 25 to 50% of meals Calorie count shows patient is only getting about 1000 kcal (half of estimated needs) Ongoing discussions with family with regards to goals of care and removal of core track 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 only increased risk of aspiration Both the niece Dennard Nip as well as her Sister Bonita Quin are agreeable that they want patient comfortable at this time They are agreeable to discussions about palliative care/hospice and concur that they do not wish any further artificial feeding We will discontinue core track in the morning-continue with hand feeding and attempt to place her probably on Monday to a skilled rehab I will start de-escalating medications Total time >30 minutes Disposition:  Status is: Inpatient  Remains inpatient appropriate because:Hemodynamically unstable, Persistent severe electrolyte disturbances, Ongoing active pain requiring inpatient pain management and Unsafe d/c plan  Dispo: The patient is from: Home              Anticipated d/c is to: SNF              Patient currently is not medically stable to d/c.   Difficult to place patient Yes    Consultants:   Nephrology  Procedures:   Antimicrobials: none    Subjective:  Somnolent   eating ~ 50% meals Note calorie count results   Objective: Vitals:   07/22/20 2316 07/23/20 0357 07/23/20 0800 07/23/20 1200  BP: (!) 142/62 (!) 121/53 (!) 133/58 131/65  Pulse: 89 99 80 98  Resp: 17 19 20 20   Temp: 98.3 F (36.8 C) 98.1 F (36.7 C) 98.7 F (37.1 C) 98.6 F (37 C)  TempSrc: Oral Oral Oral Oral  SpO2: 100% 99% 99% 100%  Weight:  56.1 kg    Height:  Intake/Output Summary (Last 24 hours) at 07/23/2020 1749 Last data filed at 07/23/2020 0800 Gross per 24 hour  Intake 120 ml  Output 850 ml  Net -730 ml   Filed Weights   07/21/20 0600 07/22/20 0350 07/23/20 0357  Weight: 60.2 kg 59.9 kg 56.1 kg    Examination:  Orientable to some degree but falls  right back to sleep at times Chest clear no rales rhonchi or Core track in place S1-S2 no murmur sinus on monitors Abdomen soft Patient has Prevalon boots on-under Prevalon on left side patient has small hematoma   Data Reviewed: personally reviewed   CBC    Component Value Date/Time   WBC 5.5 07/21/2020 0723   RBC 3.27 (L) 07/21/2020 0723   HGB 8.5 (L) 07/21/2020 0723   HCT 27.6 (L) 07/21/2020 0723   PLT 203 07/21/2020 0723   MCV 84.4 07/21/2020 0723   MCH 26.0 07/21/2020 0723   MCHC 30.8 07/21/2020 0723   RDW 16.1 (H) 07/21/2020 0723   LYMPHSABS 1.0 07/21/2020 0723   MONOABS 0.8 07/21/2020 0723   EOSABS 0.4 07/21/2020 0723   BASOSABS 0.0 07/21/2020 0723   CMP Latest Ref Rng & Units 07/23/2020 07/22/2020 07/21/2020  Glucose 70 - 99 mg/dL 924(Q) 683(M) 196(Q)  BUN 8 - 23 mg/dL 17 18 14   Creatinine 0.44 - 1.00 mg/dL ) 2.29(N) 9.89(Q)  Sodium 135 - 145 mmol/L 132(L) 129(L) 131(L)  Potassium 3.5 - 5.1 mmol/L 3.7 3.1(L) 3.5  Chloride 98 - 111 mmol/L 97(L) 95(L) 96(L)  CO2 22 - 32 mmol/L 27 27 30   Calcium 8.9 - 10.3 mg/dL 1.19(E) ) 8.3(L)  Total Protein 6.5 - 8.1 g/dL - - 4.7(L)  Total Bilirubin 0.3 - 1.2 mg/dL - - 0.5  Alkaline Phos 38 - 126 U/L - - 79  AST 15 - 41 U/L - - 37  ALT 0 - 44 U/L - - 33   Radiology Studies: No results found.  Scheduled Meds: . chlorhexidine  15 mL Mouth Rinse BID  . Chlorhexidine Gluconate Cloth  6 each Topical Q0600  . feeding supplement  237 mL Oral BID BM  . feeding supplement (OSMOLITE 1.5 CAL)  840 mL Per Tube Q24H  . feeding supplement (PROSource TF)  45 mL Per Tube BID  . free water  30 mL Per Tube Q4H  . furosemide  10 mg Oral Daily  . heparin  5,000 Units Subcutaneous Q8H  . insulin aspart  0-20 Units Subcutaneous Q4H  . levothyroxine  25 mcg Oral Q0600  . mouth rinse  15 mL Mouth Rinse q12n4p  . memantine  10 mg Oral Daily  . pantoprazole (PROTONIX) IV  40 mg Intravenous Q24H  . potassium chloride  40 mEq Oral Daily    Continuous Infusions: . sodium chloride 50 mL/hr at 07/15/20 1036  . ferumoxytol 510 mg (07/21/20 1528)     LOS: 10 days   Time spent: 20  07/17/20, MD [facility] To contact the attending provider between 7A-7P or the covering provider during after hours 7P-7A, please log into the web site [email or web address] and access using universal [place] password for that web site. If you do not have the password, please call the hospital operator.  07/23/2020, 5:49 PM

## 2020-07-23 NOTE — NC FL2 (Addendum)
[place] MEDICAID FL2 LEVEL OF CARE SCREENING TOOL     IDENTIFICATION  Patient Name: Suzanne Fox Birthdate: 1930-01-30 Sex: female Admission Date (Current Location): 07/13/2020  [facility] and [place] Number:  Producer, television/film/video and Address:  [facility], [address], [place], [place] [address]      Provider Number: [id number]  Attending Physician Name and Address:  Rhetta Mura, MD  Relative Name and Phone Number:       Current Level of Care: SNF Recommended Level of Care: Skilled Nursing Facility Prior Approval Number:    Date Approved/Denied:   PASRR Number:    Discharge Plan: SNF    Current Diagnoses: Patient Active Problem List   Diagnosis Date Noted  . Malnutrition of moderate degree 07/14/2020  . Hyponatremia 07/13/2020  . Benign essential HTN 07/13/2020  . Type 2 diabetes mellitus without complication (HCC) 07/13/2020    Orientation RESPIRATION BLADDER Height & Weight     Self,Time,Situation,Place  Normal Continent,Indwelling catheter Weight: 123 lb 10.9 oz (56.1 kg) Height:  5\' 5"  (165.1 cm)  BEHAVIORAL SYMPTOMS/MOOD NEUROLOGICAL BOWEL NUTRITION STATUS      Incontinent Diet (See DC Summary)  AMBULATORY STATUS COMMUNICATION OF NEEDS Skin   Extensive Assist Verbally                         Personal Care Assistance Level of Assistance  Bathing,Feeding,Dressing Bathing Assistance: Maximum assistance Feeding Assistance: Moderate assistance  Dressing Assistance: Maximum assistance     Functional Limitations Info  Sight,Speech,Hearing Sight Info: Adequate Hearing Info: Adequate Speech Info: Adequate    SPECIAL CARE FACTORS FREQUENCY  PT (By licensed PT),OT (By licensed OT)     PT Frequency: 5x a week OT Frequency: 5x a week            Contractures Contractures Info: Not present    Additional Factors Info  Code Status,Allergies Code Status Info: DNR Allergies Info: Aricept (Donepezil Hcl)    Sulfa Antibiotics           Current Medications (07/23/2020):  This is the current hospital active medication list Current Facility-Administered Medications  Medication Dose Route Frequency Provider Last Rate Last Admin  . 0.9 %  sodium chloride infusion   Intravenous PRN 07/25/2020, MD 50 mL/hr at 07/15/20 1036 Infusion Verify at 07/15/20 1036  . chlorhexidine (PERIDEX) 0.12 % solution 15 mL  15 mL Mouth Rinse BID 07/17/20, MD   15 mL at 07/23/20 1014  . Chlorhexidine Gluconate Cloth 2 % PADS 6 each  6 each Topical Q0600 07/25/20, MD   6 each at 07/23/20 [phone]  . feeding supplement (ENSURE ENLIVE / ENSURE PLUS) liquid 237 mL  237 mL Oral BID BM Vann, Jessica U, DO   237 mL at 07/23/20 1016  . feeding supplement (OSMOLITE 1.5 CAL) liquid 840 mL  840 mL Per Tube Q24H 07/25/20 U, DO   Stopped at 07/23/20 [phone]  . feeding supplement (PROSource TF) liquid 45 mL  45 mL Per Tube BID Vann, Jessica U, DO   45 mL at 07/23/20 1017  . ferumoxytol (FERAHEME) 510 mg in sodium chloride 0.9 % 100 mL IVPB  510 mg Intravenous Q 07/25/20, Bertrum Sol, MD 468 mL/hr at 07/21/20 1528 510 mg at 07/21/20 1528  . free water 30 mL  30 mL Per Tube Q4H Vann, Jessica U, DO   30 mL at 07/23/20 0351  . furosemide (  LASIX) tablet 10 mg  10 mg Oral Daily Rhetta Mura, MD   10 mg at 07/23/20 1019  . Gerhardt's butt cream   Topical PRN Rhetta Mura, MD   Given at 07/23/20 [phone]  . heparin injection 5,000 Units  5,000 Units Subcutaneous Q8H Pieter Partridge, MD   5,000 Units at 07/23/20 [phone]  . insulin aspart (novoLOG) injection 0-20 Units  0-20 Units Subcutaneous Q4H Marlin Canary U, DO   4 Units at 07/23/20 1240  . levothyroxine (SYNTHROID) tablet 25 mcg  25 mcg Oral Q0600 Joseph Art, DO   25 mcg at 07/23/20 0601  . MEDLINE mouth rinse  15 mL Mouth Rinse q12n4p Martina Sinner, MD   15 mL at 07/19/20 1512  . memantine (NAMENDA) tablet 10 mg  10 mg  Oral Daily Vann, Jessica U, DO   10 mg at 07/23/20 1017  . pantoprazole (PROTONIX) injection 40 mg  40 mg Intravenous Q24H Vann, Jessica U, DO   40 mg at 07/23/20 1021  . potassium chloride SA (KLOR-CON) CR tablet 40 mEq  40 mEq Oral Daily Rhetta Mura, MD   40 mEq at 07/23/20 1019     Discharge Medications: Please see discharge summary for a list of discharge medications.  Relevant Imaging Results:  Relevant Lab Results:   Additional Information SSN# [id number]  Ivette Loyal, LCSWA

## 2020-07-24 DIAGNOSIS — E871 Hypo-osmolality and hyponatremia: Secondary | ICD-10-CM | POA: Diagnosis not present

## 2020-07-24 LAB — GLUCOSE, CAPILLARY
Glucose-Capillary: 107 mg/dL — ABNORMAL HIGH (ref 70–99)
Glucose-Capillary: 121 mg/dL — ABNORMAL HIGH (ref 70–99)
Glucose-Capillary: 98 mg/dL (ref 70–99)

## 2020-07-24 MED ORDER — CHLORHEXIDINE GLUCONATE CLOTH 2 % EX PADS
6.0000 | MEDICATED_PAD | Freq: Every day | CUTANEOUS | Status: DC
  Administered 2020-07-24 – 2020-08-06 (×14): 6 via TOPICAL

## 2020-07-24 NOTE — Plan of Care (Signed)

## 2020-07-24 NOTE — Progress Notes (Signed)
PROGRESS NOTE   Suzanne Fox  FUX:[id number] DOB: September 18, 1929 DOA: 07/13/2020 PCP: Georgianne Fick, MD  Brief Narrative:   70 white female [facility] care resident DM TY 2, HTN S/p cervical surgery-ex vacuo hematoma 3/17-->3/25  Present [facility] ED 07/13/2020 lethargy bradycardia hypotension-initial sodium 110, K5.4-was on chlorthalidone PTA Hemoglobin 6.5 albumin 1.7 Nephrology consulted-felt combination T toast potomania chlorthalidone effect +/- third spacing Rx eventually 3% saline GI consulted-not a candidate for EGD/colonoscopy was heme positive on stool Positive blood cultures were contaminant  She is recovering slowly her sodium---Long family discussion wuith Family deciding regarding goals of care both daughters agreed that palliative approach is reasonable-patient will discharge to skilled with palliative 5/2  Hospital-Problem based course  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, SIADH, Chlorthalidone Previously received 3% saline--nephrology have signed off 4/26 low-dose Lasix 20 mg daily given need for aquaresis No further lab work given likely palliative nature of her care Recent hematoma evacuation from cervical surgery Anemia of iron deficiency 1 unit PRBC 4/23, Feraheme given 4/27 Despite positive heme on stool is not a candidate per GI for EGD colonoscopy No further labs Hypothyroidism Consider discontinuation at the skilled facility Synthroid 25 mcg daily HTN Stopped HCTZ this admission allow blood pressure to be as high as 160 and then reevaluate as outpatient Severe malnutrition Eating 25 to 50% of meals Calorie count shows patient is only getting about 1000 kcal (half of estimated needs) Ongoing discussions with family with regards to goals of care and removal of core track Stage III-IV dementia Adult failure-to-thrive Decubitus on left heel from prior to admission Continue Namenda 10 mg daily for now Family agreeable to palliative care and  de-escalation of meds in addition to removal of core track  Continue frequent turning and Prevalon boots We will d/c to SNF with palliative following in the next 48 hours    DVT prophylaxis: Heparin Code Status: DNR Family Communication: discussed on 4/29 with Laruth Bouchard Niece   [phone]  POA is sister our discussion entailed End-of-life care and management and artificial feeding with core track We discussed that PEG tubes etc. only increased risk of aspiration Family agreeable as discussed on 4/29 to discharge with palliative care at facility likely will happen on 5/2  Disposition:  Status is: Inpatient  Remains inpatient appropriate because:Hemodynamically unstable, Persistent severe electrolyte disturbances, Ongoing active pain requiring inpatient pain management and Unsafe d/c plan  Dispo: The patient is from: Home              Anticipated d/c is to: SNF              Patient currently is not medically stable to d/c.   Difficult to place patient Yes    Consultants:   Nephrology  Procedures:   Antimicrobials: none    Subjective:  More arousable Core track out She seems confused to some degree but remembers her niece and her name  Objective: Vitals:   07/24/20 0335 07/24/20 0550 07/24/20 0746 07/24/20 1149  BP: (!) 154/61  (!) 151/57 (!) 152/67  Pulse: 83  82 86  Resp: 15  17 15   Temp: 97.6 F (36.4 C)  97.9 F (36.6 C) 97.7 F (36.5 C)  TempSrc: Oral  Oral Oral  SpO2: 97%  96% 100%  Weight:  55.6 kg    Height:        Intake/Output Summary (Last 24 hours) at 07/24/2020 1438 Last data filed at 07/24/2020 1200 Gross per 24 hour  Intake 280 ml  Output 750 ml  Net -470 ml   Filed Weights   07/22/20 0350 07/23/20 0357 07/24/20 0550  Weight: 59.9 kg 56.1 kg 55.6 kg    Examination:  More awake today Chest clear no rales rhonchi or Abdomen soft nontender no rebound   Data Reviewed: personally reviewed   CBC    Component Value Date/Time    WBC 5.5 07/21/2020 0723   RBC 3.27 (L) 07/21/2020 0723   HGB 8.5 (L) 07/21/2020 0723   HCT 27.6 (L) 07/21/2020 0723   PLT 203 07/21/2020 0723   MCV 84.4 07/21/2020 0723   MCH 26.0 07/21/2020 0723   MCHC 30.8 07/21/2020 0723   RDW 16.1 (H) 07/21/2020 0723   LYMPHSABS 1.0 07/21/2020 0723   MONOABS 0.8 07/21/2020 0723   EOSABS 0.4 07/21/2020 0723   BASOSABS 0.0 07/21/2020 0723   CMP Latest Ref Rng & Units 07/23/2020 07/22/2020 07/21/2020  Glucose 70 - 99 mg/dL 357(I) 978(E) 784(X)  BUN 8 - 23 mg/dL 17 18 14   Creatinine 0.44 - 1.00 mg/dL ) 2.82(K) 8.13(G)  Sodium 135 - 145 mmol/L 132(L) 129(L) 131(L)  Potassium 3.5 - 5.1 mmol/L 3.7 3.1(L) 3.5  Chloride 98 - 111 mmol/L 97(L) 95(L) 96(L)  CO2 22 - 32 mmol/L 27 27 30   Calcium 8.9 - 10.3 mg/dL 8.71(L) ) 8.3(L)  Total Protein 6.5 - 8.1 g/dL - - 4.7(L)  Total Bilirubin 0.3 - 1.2 mg/dL - - 0.5  Alkaline Phos 38 - 126 U/L - - 79  AST 15 - 41 U/L - - 37  ALT 0 - 44 U/L - - 33   Radiology Studies: No results found.  Scheduled Meds: . chlorhexidine  15 mL Mouth Rinse BID  . Chlorhexidine Gluconate Cloth  6 each Topical Q0600  . furosemide  10 mg Oral Daily  . levothyroxine  25 mcg Oral Q0600  . mouth rinse  15 mL Mouth Rinse q12n4p  . memantine  10 mg Oral Daily   Continuous Infusions: . sodium chloride 50 mL/hr at 07/15/20 1036     LOS: 11 days   Time spent: 15  12-29-2004, MD [facility] To contact the attending provider between 7A-7P or the covering provider during after hours 7P-7A, please log into the web site [email or web address] and access using universal [place] password for that web site. If you do not have the password, please call the hospital operator.  07/24/2020, 2:38 PM

## 2020-07-25 DIAGNOSIS — E871 Hypo-osmolality and hyponatremia: Secondary | ICD-10-CM | POA: Diagnosis not present

## 2020-07-25 LAB — HEMOGLOBIN AND HEMATOCRIT, BLOOD
HCT: 28.1 % — ABNORMAL LOW (ref 36.0–46.0)
Hemoglobin: 8.6 g/dL — ABNORMAL LOW (ref 12.0–15.0)

## 2020-07-25 LAB — TSH: TSH: 2.866 u[IU]/mL (ref 0.350–4.500)

## 2020-07-25 LAB — MAGNESIUM: Magnesium: 1.6 mg/dL — ABNORMAL LOW (ref 1.7–2.4)

## 2020-07-25 MED ORDER — METOPROLOL TARTRATE 5 MG/5ML IV SOLN
2.5000 mg | Freq: Once | INTRAVENOUS | Status: AC
  Administered 2020-07-25: 2.5 mg via INTRAVENOUS
  Filled 2020-07-25: qty 5

## 2020-07-25 NOTE — Progress Notes (Addendum)
PROGRESS NOTE   Suzanne Fox  NOM:[id number] DOB: 1929/04/10 DOA: 07/13/2020 PCP: Georgianne Fick, MD  Brief Narrative:   66 white female [facility] resident DM TY 2, HTN S/p cervical surgery-ex vacuo hematoma 3/17-->3/25 [has another Epic chart #-Adelsberger, Jahmiya Guidotti [id number]]  Present [facility] ED 07/13/2020 lethargy bradycardia hypotension-initial sodium 110, K5.4-was on chlorthalidone PTA Hemoglobin 6.5 albumin 1.7 Nephrology consulted-felt combination T toast potomania chlorthalidone effect +/- third spacing Rx eventually 3% saline GI consulted-not a candidate for EGD/colonoscopy was heme positive on stool Positive blood cultures were contaminant  She is recovering slowly her sodium---Long family discussion with Family deciding regarding goals of care both daughters agreed that palliative approach is reasonable-patient will discharge to skilled with palliative 5/2  Hospital-Problem based course  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, SIADH, Chlorthalidone Previously received 3% saline--nephrology have signed off 4/26 low-dose Lasix 20 mg daily given need for aquaresis No further lab work given likely palliative nature of her care Recent hematoma evacuation from cervical surgery Anemia of iron deficiency 1 unit PRBC 4/23, Feraheme given 4/27 Heme +,   not a candidate per GI for EGD colonoscopy No further labs Hypothyroidism Consider discontinuation at the skilled facility Synthroid 25 mcg daily HTN Stopped HCTZ--stop aggressive monitoring Severe malnutrition Eating 25 to 50% of meals Not eating a lot Core-track removed 07/25/2020 Stage III-IV dementia Adult failure-to-thrive Decubitus on left heel from prior to admission Continue Namenda 10 mg daily for now Family agreeable to palliative care and de-escalation of meds in addition to removal of core track  Continue turning and Prevalon boots as she allows We will d/c to SNF with palliative following   DVT  prophylaxis: Heparin Code Status: DNR Family Communication: discussed on 4/29 with Laruth Bouchard Niece   [phone]  POA is sister our discussion entailed End-of-life care and management and artificial feeding with core track We discussed that PEG tubes etc. only increased risk of aspiration Family agreeable as discussed on 4/29 to discharge with palliative care at facility likely will happen on 5/2  Disposition:  Status is: Inpatient  Remains inpatient appropriate because:Hemodynamically unstable, Persistent severe electrolyte disturbances, Ongoing active pain requiring inpatient pain management and Unsafe d/c plan  Dispo: The patient is from: Home              Anticipated d/c is to: SNF              Patient currently is not medically stable to d/c.   Difficult to place patient Yes    Consultants:   Nephrology  Procedures:   Antimicrobials: none    Subjective:  Awake alert had visitor today No distress  Objective: Vitals:   07/25/20 0349 07/25/20 0800 07/25/20 1219 07/25/20 1600  BP: (!) 156/61 (!) 163/66 (!) 165/67 (!) 161/67  Pulse: 91 84 86 82  Resp: 19 17 15 17   Temp: 98.8 F (37.1 C) 97.9 F (36.6 C) 98.1 F (36.7 C) 99 F (37.2 C)  TempSrc: Oral Oral Axillary Oral  SpO2: 100% 98% 98% 100%  Weight:      Height:        Intake/Output Summary (Last 24 hours) at 07/25/2020 1802 Last data filed at 07/25/2020 1200 Gross per 24 hour  Intake 500 ml  Output 550 ml  Net -50 ml   Filed Weights   07/22/20 0350 07/23/20 0357 07/24/20 0550  Weight: 59.9 kg 56.1 kg 55.6 kg    Examination:  Awake somewhat coherent Chest clear no rales rhonchi Abdomen  soft nontender no rebound LE's soft--Prevalon boots on   Data Reviewed: personally reviewed   CBC    Component Value Date/Time   WBC 5.5 07/21/2020 0723   RBC 3.27 (L) 07/21/2020 0723   HGB 8.5 (L) 07/21/2020 0723   HCT 27.6 (L) 07/21/2020 0723   PLT 203 07/21/2020 0723   MCV 84.4 07/21/2020 0723    MCH 26.0 07/21/2020 0723   MCHC 30.8 07/21/2020 0723   RDW 16.1 (H) 07/21/2020 0723   LYMPHSABS 1.0 07/21/2020 0723   MONOABS 0.8 07/21/2020 0723   EOSABS 0.4 07/21/2020 0723   BASOSABS 0.0 07/21/2020 0723   CMP Latest Ref Rng & Units 07/23/2020 07/22/2020 07/21/2020  Glucose 70 - 99 mg/dL 474(Q) 595(G) 387(F)  BUN 8 - 23 mg/dL 17 18 14   Creatinine 0.44 - 1.00 mg/dL ) 6.43(P) 2.95(J)  Sodium 135 - 145 mmol/L 132(L) 129(L) 131(L)  Potassium 3.5 - 5.1 mmol/L 3.7 3.1(L) 3.5  Chloride 98 - 111 mmol/L 97(L) 95(L) 96(L)  CO2 22 - 32 mmol/L 27 27 30   Calcium 8.9 - 10.3 mg/dL 8.84(Z) ) 8.3(L)  Total Protein 6.5 - 8.1 g/dL - - 4.7(L)  Total Bilirubin 0.3 - 1.2 mg/dL - - 0.5  Alkaline Phos 38 - 126 U/L - - 79  AST 15 - 41 U/L - - 37  ALT 0 - 44 U/L - - 33   Radiology Studies: No results found.  Scheduled Meds: . chlorhexidine  15 mL Mouth Rinse BID  . Chlorhexidine Gluconate Cloth  6 each Topical Q0600  . furosemide  10 mg Oral Daily  . levothyroxine  25 mcg Oral Q0600  . mouth rinse  15 mL Mouth Rinse q12n4p  . memantine  10 mg Oral Daily   Continuous Infusions: . sodium chloride 50 mL/hr at 07/15/20 1036     LOS: 12 days   Time spent: 15  12-29-2004, MD [facility] To contact the attending provider between 7A-7P or the covering provider during after hours 7P-7A, please log into the web site [email or web address] and access using universal [place] password for that web site. If you do not have the password, please call the hospital operator.  07/25/2020, 6:02 PM

## 2020-07-25 NOTE — Plan of Care (Signed)

## 2020-07-25 NOTE — TOC Progression Note (Signed)
Transition of Care [facility]) - Progression Note    Patient Details  Name: Suzanne Fox MRN: [id number] Date of Birth: 1930-02-01  Transition of Care [facility]) CM/SW Contact  Patrice [place], [place] Phone Number: [phone] 07/25/2020, 4:20 PM  Clinical Narrative:     CSW started pt's authorization with start date of 5/2. Reference number [id number] and faxed clinicals.  TOC team will continue to assist with discharge planning needs.  Expected Discharge Plan: Long Term Nursing Home Barriers to Discharge: Continued Medical Work up  Expected Discharge Plan and Services Expected Discharge Plan: Long Term Nursing Home       Living arrangements for the past 2 months: Skilled Nursing Facility                                       Social Determinants of Health (SDOH) Interventions    Readmission Risk Interventions No flowsheet data found.

## 2020-07-26 DIAGNOSIS — E871 Hypo-osmolality and hyponatremia: Secondary | ICD-10-CM | POA: Diagnosis not present

## 2020-07-26 LAB — SARS CORONAVIRUS 2 (TAT 6-24 HRS): SARS Coronavirus 2: NEGATIVE

## 2020-07-26 MED ORDER — GERHARDT'S BUTT CREAM: 1.0000 "application " | TOPICAL_CREAM | Freq: Every day | CUTANEOUS | Status: DC

## 2020-07-26 NOTE — Plan of Care (Signed)

## 2020-07-26 NOTE — Progress Notes (Signed)
Physical Therapy Treatment Patient Details Name: Suzanne Fox MRN: [id number] DOB: 10/04/1929 Today's Date: 07/26/2020    History of Present Illness 87 yo female presents to ED on 4/19 from [facility] with lethargy, AMS, hypotension, hyponatremia. Cortrak placed for nocturnal feeds. PMH significant for HTN, DM2, dementia, tobacco abuse, PAD, OA, recent PNA and UTI, and recent admission 3/17-3/25 with spontaneous epidural hematoma requiring C spine decompression and fusion.    PT Comments    Pt making slow, steady progress. Had improved sitting balance today and was able to partially stand with +2 max assist. Continue to recommend return to SNF at DC.    Follow Up Recommendations  SNF     Equipment Recommendations  None recommended by PT    Recommendations for Other Services       Precautions / Restrictions Precautions Precautions: Fall    Mobility  Bed Mobility Overal bed mobility: Needs Assistance Bed Mobility: Supine to Sit;Sit to Supine     Supine to sit: +2 for physical assistance;Mod assist Sit to supine: Mod assist;+2 for physical assistance   General bed mobility comments: Assist to bring legs off of bed, elevate trunk into sitting and bring hips to EOB. Assist to lower trunk and bring legs back up into bed.    Transfers Overall transfer level: Needs assistance Equipment used:  (used back of chair for pt to use LUE on) Transfers: Sit to/from Stand Sit to Stand: +2 physical assistance;Max assist         General transfer comment: Assist using bed pad to bring pt up into partial standing position.  Ambulation/Gait                 Stairs             Wheelchair Mobility    Modified Rankin (Stroke Patients Only)       Balance Overall balance assessment: Needs assistance Sitting-balance support: Feet supported;Bilateral upper extremity supported Sitting balance-Leahy Scale: Poor Sitting balance - Comments: Pt sat EOB x 10 minutes  with min to min guard assist. Verbal cues to bring shoulders anteriorly Postural control: Posterior lean                                  Cognition Arousal/Alertness: Awake/alert Behavior During Therapy: WFL for tasks assessed/performed Overall Cognitive Status: History of cognitive impairments - at baseline                                 General Comments: Pt with history of dementia      Exercises      General Comments General comments (skin integrity, edema, etc.): VSS      Pertinent Vitals/Pain Pain Assessment: Faces Faces Pain Scale: Hurts little more Pain Location: RUE Pain Descriptors / Indicators: Grimacing;Guarding Pain Intervention(s): Monitored during session;Limited activity within patient's tolerance;Repositioned    Home Living                      Prior Function            PT Goals (current goals can now be found in the care plan section) Acute Rehab PT Goals Patient Stated Goal: none stated Progress towards PT goals: Progressing toward goals    Frequency    Min 2X/week      PT Plan Current plan remains appropriate  Co-evaluation              AM-PAC PT "6 Clicks" Mobility   Outcome Measure  Help needed turning from your back to your side while in a flat bed without using bedrails?: A Lot Help needed moving from lying on your back to sitting on the side of a flat bed without using bedrails?: Total Help needed moving to and from a bed to a chair (including a wheelchair)?: Total Help needed standing up from a chair using your arms (e.g., wheelchair or bedside chair)?: Total Help needed to walk in hospital room?: Total Help needed climbing 3-5 steps with a railing? : Total 6 Click Score: 7    End of Session   Activity Tolerance: Patient tolerated treatment well Patient left: in bed;with call bell/phone within reach;with bed alarm set Nurse Communication: Mobility status PT Visit Diagnosis: Other  abnormalities of gait and mobility (R26.89);Muscle weakness (generalized) (M62.81)     Time: 4536-4680 PT Time Calculation (min) (ACUTE ONLY): 15 min  Charges:  $Therapeutic Activity: 8-22 mins                     [facility] PT Acute Rehabilitation Services Pager [phone] Office [phone]    Angelina Ok [facility] 07/26/2020, 2:56 PM

## 2020-07-26 NOTE — TOC Progression Note (Signed)
Transition of Care [facility]) - Progression Note    Patient Details  Name: Suzanne Fox MRN: [id number] Date of Birth: 1929-08-12  Transition of Care [facility]) CM/SW Contact  Ivette Loyal, [place] Phone Number: 07/26/2020, 3:57 PM  Clinical Narrative:    3:30pm- CSW spoke with [employer] about pt needing peer to peer CSW sent message to MD at 3:44pm [phone] opt. 5 pt. Pt is ready for dc, pending insurance. CSW contacted Olegario Messier at [facility] and let her know as well.    Expected Discharge Plan: Long Term Nursing Home Barriers to Discharge: Continued Medical Work up  Expected Discharge Plan and Services Expected Discharge Plan: Long Term Nursing Home       Living arrangements for the past 2 months: Skilled Nursing Facility Expected Discharge Date: 07/26/20                                     Social Determinants of Health (SDOH) Interventions    Readmission Risk Interventions No flowsheet data found.

## 2020-07-26 NOTE — Discharge Summary (Signed)
Physician Discharge Summary  Suzanne Fox SWH:[id number] DOB: [birth year] DOA: 07/13/2020  PCP: Suzanne Fick, MD   Admit date: 07/13/2020 Discharge date: 07/26/2020  Time spent: 32 minutes  Recommendations for Outpatient Follow-up:  1. Patient to follow at skilled facility for rehab and may need to have discussions about palliative care if does not do well 2. Would not do further labs and have de-escalated all the meds that she is on to the bare minimum 3. Please turn patient every 2 4. -Defer to SNF MD with regards to this transition  Discharge Diagnoses:  MAIN problem for hospitalization   severe hyponatremia Adult failure to thrive Recent hematoma evacuation from cervical surgery  Please see below for itemized issues addressed in HOpsital- refer to other progress notes for clarity if needed  Discharge Condition: Fair   Diet recommendation: Soft diet dysphagia 1  Filed Weights   07/22/20 0350 07/23/20 0357 07/24/20 0550  Weight: 59.9 kg 56.1 kg 55.6 kg    History of present illness:  33 white female [facility] care resident DM TY 2, HTN S/p cervical surgery-ex vacuo hematoma 3/17-->3/25 [has another Epic chart Suzanne Fox, Suzanne Fox [id number]]  Present [facility] ED 07/13/2020 lethargy bradycardia hypotension-initial sodium 110, K5.4-was on chlorthalidone PTA Hemoglobin 6.5 albumin 1.7 Nephrology consulted-felt combination T toast potomania chlorthalidone effect +/- third spacing Rx eventually 3% saline GI consulted-not a candidate for EGD/colonoscopy was heme positive on stool Positive blood cultures were contaminant  She is recovering slowly her sodium---Long family discussion with Family deciding regarding goals of care both daughters agreed that palliative approach is reasonable-patient will discharge to skilled with palliative 5/2  Hospital Course:  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, SIADH, Chlorthalidone Previously received 3% saline--nephrology have  signed off 4/26 low-dose Lasix was given during hospital stay for aquaresis and has been discontinued as we are not doing any further lab work given likely palliative nature of her care Recent hematoma evacuation from cervical surgery Anemia of iron deficiency 1 unit PRBC 4/23, Feraheme given 4/27 Heme +,   not a candidate per GI for EGD colonoscopy No further labs Hypothyroidism Consider discontinuation at the skilled facility Synthroid 25 mcg daily HTN Stopped HCTZ--stop aggressive monitoring Severe malnutrition Eating 25 to 50% of meals Not eating a lot Core-track removed 07/25/2020 Stage III-IV dementia Adult failure-to-thrive Decubitus on left heel from prior to admission Continue Namenda 10 mg daily for now Family agreeable to palliative care and de-escalation of meds in addition to removal of core track  Continue turning and Prevalon boots as she allows We will d/c to SNF with palliative following Patient is alert no distress  Discharge Exam: Vitals:   07/25/20 2358 07/26/20 0800  BP: (!) 153/79 (!) 164/67  Pulse: (!) 107 86  Resp: 18 17  Temp: 98.1 F (36.7 C) 98.2 F (36.8 C)  SpO2: 95% 97%    Subj on day of d/c   Patient is alert in no distress she has not touched her plate this morning She is coherent x1   General Exam on discharge  EOMI NCAT bitemporal supraclavicular wasting S1-S2 no murmur Abdomen soft no rebound no guarding No lower extremity edema ROM intact Left heel has a blEb-she is wearing Prevalon's   Discharge Instructions   Discharge Instructions    Diet - low sodium heart healthy   Complete by: As directed    Increase activity slowly   Complete by: As directed      Allergies as of 07/26/2020  Reactions   Aricept [donepezil Hcl]    Per MAR   Sulfa Antibiotics    Per MAR       Medication List    STOP taking these medications   amLODipine 10 MG tablet Commonly known as: NORVASC   aspirin EC 81 MG tablet    atenolol-chlorthalidone 50-25 MG tablet Commonly known as: TENORETIC   atorvastatin 40 MG tablet Commonly known as: LIPITOR   cefTRIAXone 1 g injection Commonly known as: ROCEPHIN   feeding supplement (GLUCERNA SHAKE) Liqd   insulin glargine 100 UNIT/ML injection Commonly known as: LANTUS   levofloxacin 750 MG tablet Commonly known as: LEVAQUIN   lisinopril 40 MG tablet Commonly known as: ZESTRIL   methocarbamol 500 MG tablet Commonly known as: ROBAXIN   NovoLOG FlexPen 100 UNIT/ML FlexPen Generic drug: insulin aspart   sodium chloride 1 g tablet     TAKE these medications   acetaminophen 325 MG tablet Commonly known as: TYLENOL Take 650 mg by mouth every 4 (four) hours as needed for mild pain.   cloNIDine 0.1 MG tablet Commonly known as: CATAPRES Take 0.1 mg by mouth 2 (two) times daily.   Gerhardt's butt cream Crea Apply 1 application topically daily.   levothyroxine 25 MCG tablet Commonly known as: SYNTHROID Take 25 mcg by mouth daily before breakfast.   memantine 10 MG tablet Commonly known as: NAMENDA Take 10 mg by mouth daily.      Allergies  Allergen Reactions  . Aricept [Donepezil Hcl]     Per MAR  . Sulfa Antibiotics     Per MAR       The results of significant diagnostics from this hospitalization (including imaging, microbiology, ancillary and laboratory) are listed below for reference.    Significant Diagnostic Studies: DG CHEST PORT 1 VIEW  Result Date: 07/13/2020 CLINICAL DATA:  Weakness. EXAM: PORTABLE CHEST 1 VIEW COMPARISON:  None. FINDINGS: 2118 hours. Bibasilar atelectasis/infiltrate noted with small bilateral pleural effusions. The cardiopericardial silhouette is within normal limits for size. The visualized bony structures of the thorax show no acute abnormality. Degenerative changes noted right shoulder. Telemetry leads overlie the chest. IMPRESSION: Bibasilar atelectasis/infiltrate with small bilateral pleural effusions.  Electronically Signed   By: Kennith CenterEric  Mansell M.D.   On: 07/13/2020 21:30   DG Abd Portable 1V  Result Date: 07/14/2020 CLINICAL DATA:  Feeding tube placement EXAM: PORTABLE ABDOMEN - 1 VIEW COMPARISON:  None. FINDINGS: Feeding tube with tip at the level of the pylorus. The bowel gas pattern is normal. Hazy bilateral lower chest opacification with preceding dedicated chest radiograph. Severe lumbar spine degeneration. IMPRESSION: Feeding tube with tip at the pylorus. Electronically Signed   By: Marnee SpringJonathon  Watts M.D.   On: 07/14/2020 10:14   VAS US UPPER EXTREMITY VENOUS DUPLEX  Result Date: 07/14/2020 UPPER VENOUS STUDY  Indications: Edema Risk Factors: None identified. Limitations: Poor ultrasound/tissue interface and patient positioning. Comparison Study: No prior studies. Performing Technologist: Chanda BusingGregory Collins RVT  Examination Guidelines: A complete evaluation includes B-mode imaging, spectral Doppler, color Doppler, and power Doppler as needed of all accessible portions of each vessel. Bilateral testing is considered an integral part of a complete examination. Limited examinations for reoccurring indications may be performed as noted.  Right Findings: +----------+------------+---------+-----------+----------+-------+ RIGHT     CompressiblePhasicitySpontaneousPropertiesSummary +----------+------------+---------+-----------+----------+-------+ IJV           Full       Yes       Yes                      +----------+------------+---------+-----------+----------+-------+  Subclavian    Full       Yes       Yes                      +----------+------------+---------+-----------+----------+-------+ Axillary      Full       Yes       Yes                      +----------+------------+---------+-----------+----------+-------+ Brachial      Full       Yes       Yes                      +----------+------------+---------+-----------+----------+-------+ Radial        Full                                           +----------+------------+---------+-----------+----------+-------+ Ulnar         Full                                          +----------+------------+---------+-----------+----------+-------+ Cephalic      Full                                          +----------+------------+---------+-----------+----------+-------+ Basilic       Full                                          +----------+------------+---------+-----------+----------+-------+  Left Findings: +----------+------------+---------+-----------+----------+-------+ LEFT      CompressiblePhasicitySpontaneousPropertiesSummary +----------+------------+---------+-----------+----------+-------+ Subclavian    Full       Yes       Yes                      +----------+------------+---------+-----------+----------+-------+  Summary:  Right: No evidence of deep vein thrombosis in the upper extremity. No evidence of superficial vein thrombosis in the upper extremity.  Left: No evidence of thrombosis in the subclavian.  *See table(s) above for measurements and observations.  Diagnosing physician: Heath Lark Electronically signed by Heath Lark on 07/14/2020 at 4:35:25 PM.    Final     Microbiology: Recent Results (from the past 240 hour(s))  SARS CORONAVIRUS 2 (TAT 6-24 HRS) Nasopharyngeal Nasopharyngeal Swab     Status: None   Collection Time: 07/25/20  1:18 PM   Specimen: Nasopharyngeal Swab  Result Value Ref Range Status   SARS Coronavirus 2 NEGATIVE NEGATIVE Final    Comment: (NOTE) SARS-CoV-2 target nucleic acids are NOT DETECTED.  The SARS-CoV-2 RNA is generally detectable in upper and lower respiratory specimens during the acute phase of infection. Negative results do not preclude SARS-CoV-2 infection, do not rule out co-infections with other pathogens, and should not be used as the sole basis for treatment or other patient management decisions. Negative results must be  combined with clinical observations, patient history, and epidemiological information. The expected result is Negative.  Fact Sheet for Patients: HairSlick.no  Fact Sheet for Healthcare Providers: quierodirigir.com  This test is not yet approved or  cleared by the [place] and  has been authorized for detection and/or diagnosis of SARS-CoV-2 by FDA under an Emergency Use Authorization (EUA). This EUA will remain  in effect (meaning this test can be used) for the duration of the COVID-19 declaration under Se ction 564(b)(1) of the Act, 21 U.S.C. section 360bbb-3(b)(1), unless the authorization is terminated or revoked sooner.  Performed at [facility], [address] [place], [place] [address]      Labs: Basic Metabolic Panel: Recent Labs  Lab 07/20/20 0109 07/21/20 0723 07/22/20 0020 07/23/20 0705 07/25/20 2140  NA 128* 131* 129* 132*  --   K 3.7 3.5 3.1* 3.7  --   CL 95* 96* 95* 97*  --   CO2 27 30 27 27   --   GLUCOSE 182* 198* 264* 193*  --   BUN 9 14 18 17   --   CREATININE 0.40* 0.38* 0.41* 0.35*  --   CALCIUM 8.1* 8.3* 8.5* 8.8*  --   MG 1.6*  --  1.8  --  1.6*  PHOS 2.9  --  2.7  --   --    Liver Function Tests: Recent Labs  Lab 07/21/20 0723 07/22/20 0020  AST 37  --   ALT 33  --   ALKPHOS 79  --   BILITOT 0.5  --   PROT 4.7*  --   ALBUMIN 2.2* 2.2*   No results for input(s): LIPASE, AMYLASE in the last 168 hours. No results for input(s): AMMONIA in the last 168 hours. CBC: Recent Labs  Lab 07/20/20 0109 07/21/20 0723 07/25/20 2140  WBC 5.8 5.5  --   NEUTROABS  --  3.3  --   HGB 8.5* 8.5* 8.6*  HCT 26.8* 27.6* 28.1*  MCV 83.0 84.4  --   PLT 196 203  --    Cardiac Enzymes: No results for input(s): CKTOTAL, CKMB, CKMBINDEX, TROPONINI in the last 168 hours. BNP: BNP (last 3 results) No results for input(s): BNP in the last 8760 hours.  ProBNP (last 3 results) No results  for input(s): PROBNP in the last 8760 hours.  CBG: Recent Labs  Lab 07/23/20 1628 07/23/20 2003 07/24/20 0025 07/24/20 0331 07/24/20 0749  GLUCAP 130* 91 98 107* 121*       Signed:  07/26/20 MD   [facility] 07/26/2020, 9:48 AM

## 2020-07-27 LAB — CBC WITH DIFFERENTIAL/PLATELET
Abs Immature Granulocytes: 0.03 10*3/uL (ref 0.00–0.07)
Basophils Absolute: 0 10*3/uL (ref 0.0–0.1)
Basophils Relative: 1 %
Eosinophils Absolute: 0.4 10*3/uL (ref 0.0–0.5)
Eosinophils Relative: 7 %
HCT: 30.7 % — ABNORMAL LOW (ref 36.0–46.0)
Hemoglobin: 9.7 g/dL — ABNORMAL LOW (ref 12.0–15.0)
Immature Granulocytes: 1 %
Lymphocytes Relative: 22 %
Lymphs Abs: 1.3 10*3/uL (ref 0.7–4.0)
MCH: 26.9 pg (ref 26.0–34.0)
MCHC: 31.6 g/dL (ref 30.0–36.0)
MCV: 85.3 fL (ref 80.0–100.0)
Monocytes Absolute: 0.7 10*3/uL (ref 0.1–1.0)
Monocytes Relative: 11 %
Neutro Abs: 3.6 10*3/uL (ref 1.7–7.7)
Neutrophils Relative %: 58 %
Platelets: 309 10*3/uL (ref 150–400)
RBC: 3.6 MIL/uL — ABNORMAL LOW (ref 3.87–5.11)
RDW: 18.4 % — ABNORMAL HIGH (ref 11.5–15.5)
WBC: 6 10*3/uL (ref 4.0–10.5)
nRBC: 0 % (ref 0.0–0.2)

## 2020-07-27 LAB — COMPREHENSIVE METABOLIC PANEL
ALT: 36 U/L (ref 0–44)
AST: 28 U/L (ref 15–41)
Albumin: 2.3 g/dL — ABNORMAL LOW (ref 3.5–5.0)
Alkaline Phosphatase: 67 U/L (ref 38–126)
Anion gap: 9 (ref 5–15)
BUN: 9 mg/dL (ref 8–23)
CO2: 26 mmol/L (ref 22–32)
Calcium: 9 mg/dL (ref 8.9–10.3)
Chloride: 99 mmol/L (ref 98–111)
Creatinine, Ser: 0.35 mg/dL — ABNORMAL LOW (ref 0.44–1.00)
GFR, Estimated: >60 mL/min (ref >60)
Glucose, Bld: 113 mg/dL — ABNORMAL HIGH (ref 70–99)
Potassium: 3.1 mmol/L — ABNORMAL LOW (ref 3.5–5.1)
Sodium: 134 mmol/L — ABNORMAL LOW (ref 135–145)
Total Bilirubin: 0.2 mg/dL — ABNORMAL LOW (ref 0.3–1.2)
Total Protein: 4.6 g/dL — ABNORMAL LOW (ref 6.5–8.1)

## 2020-07-27 NOTE — TOC Progression Note (Addendum)
Transition of Care [facility]) - Progression Note    Patient Details  Name: Suzanne Fox MRN: [id number] Date of Birth: [birth year]  [facility]) CM/SW Contact  Ivette Loyal, [place] Phone Number: 07/27/2020, 12:26 PM  Clinical Narrative:    12:25pm- Pt auth was denied, CSW will start new auth and follow up with SNF.  1:00pm- CSW followed up with pt's niece and she was upset about pt being denied for SNF bc she has no one to care for her 24/7. CSW explained that insurance case manager suggested starting new auth to see if new progression can be approved. Pt;s niece is agreeable with resubmitting auth, CSW started new auth with ref# [id number] and clinicals have been faxed.   Expected Discharge Plan: Long Term Nursing Home Barriers to Discharge: Continued Medical Work up  Expected Discharge Plan and Services Expected Discharge Plan: Long Term Nursing Home       Living arrangements for the past 2 months: Skilled Nursing Facility Expected Discharge Date: 07/26/20                                     Social Determinants of Health (SDOH) Interventions    Readmission Risk Interventions No flowsheet data found.

## 2020-07-27 NOTE — Progress Notes (Signed)
Nutrition Follow-up  DOCUMENTATION CODES:   Non-severe (moderate) malnutrition in context of chronic illness  INTERVENTION:   -Continue Magic cup TID with meals, each supplement provides 290 kcal and 9 grams of protein  NUTRITION DIAGNOSIS:   Moderate Malnutrition related to chronic illness (dementia) as evidenced by moderate fat depletion,severe muscle depletion.  Ongoing  GOAL:   Patient will meet greater than or equal to 90% of their needs  Unmet  MONITOR:   PO intake,Supplement acceptance,Labs,Weight trends,Skin,I & O's  REASON FOR ASSESSMENT:   Consult Enteral/tube feeding initiation and management  ASSESSMENT:   87 year old female who presented to the ED on 4/19 from [facility] with AMS and hypotension. PMH of dementia, HTN, T2DM, and recent cervical surgery for hematoma. Pt admitted with hyponatremia in the setting of suspected dehydration/poor nutrition and use of chlorthalidone as well as acute metabolic encephalopathy.  4/20- Cortrak placed, tip at the pylorus per abdominal x-ray 4/29- calorie count completed- meeting 64% of estimated energy needs 5/1- cortrak removed  Reviewed I/O's: -1 L x 24 hours and -11.1 L since admission  UOP: 1.5 L x 24 hours  Pt sleeping soundly at time of visit. She did not respond to her name being called.   Cortrak removed on 07/25/20; per MD and palliative care notes, pt and family do not desire artifical means of nutrition and do not desire further escalation of care. Pt remains with poor oral intake; noted meal completions 30-50%.   Palliative care following; plan to d/c to SNF with palliative care services. Pt is medically stable for discharge, but awaiting SNF palcement.   Labs reviewed: Na: 134, K: 3.1, Mg: 1.6, CBGS: 121 (inpatient orders for glycemic control are none).   Diet Order:   Diet Order            Diet - low sodium heart healthy           DIET - DYS 1 Room service appropriate? Yes; Fluid  consistency: Thin  Diet effective now                 EDUCATION NEEDS:   Not appropriate for education at this time  Skin:  Skin Assessment: Reviewed RN Assessment  Last BM:  07/26/20  Height:   Ht Readings from Last 1 Encounters:  07/13/20 5\' 5"  (1.651 m)    Weight:   Wt Readings from Last 1 Encounters:  07/24/20 55.6 kg    Ideal Body Weight:  56.8 kg  BMI:  Body mass index is 20.4 kg/m.  Estimated Nutritional Needs:   Kcal:  1550-1750 kcal  Protein:  80-95 grams  Fluid:  >/= 1.5 L/day    07/26/20, RD, LDN, CDCES Registered Dietitian II Certified Diabetes Care and Education Specialist Please refer to [facility] for RD and/or RD on-call/weekend/after hours pager

## 2020-07-27 NOTE — Progress Notes (Signed)
Patient seen and examined  No changes from yesterday She is eating some at bedside today and is pleasantly confused called peer-to-peer but no response this morning despite me leaving a message Have requested that clinical social worker reach out to other facilities to search.  Pleas Koch, MD [facility] 6:18 PM

## 2020-07-28 DIAGNOSIS — E871 Hypo-osmolality and hyponatremia: Secondary | ICD-10-CM | POA: Diagnosis not present

## 2020-07-28 LAB — COMPREHENSIVE METABOLIC PANEL
ALT: 29 U/L (ref 0–44)
AST: 21 U/L (ref 15–41)
Albumin: 2.3 g/dL — ABNORMAL LOW (ref 3.5–5.0)
Alkaline Phosphatase: 68 U/L (ref 38–126)
Anion gap: 5 (ref 5–15)
BUN: 7 mg/dL — ABNORMAL LOW (ref 8–23)
CO2: 28 mmol/L (ref 22–32)
Calcium: 8.7 mg/dL — ABNORMAL LOW (ref 8.9–10.3)
Chloride: 99 mmol/L (ref 98–111)
Creatinine, Ser: 0.36 mg/dL — ABNORMAL LOW (ref 0.44–1.00)
GFR, Estimated: >60 mL/min (ref >60)
Glucose, Bld: 130 mg/dL — ABNORMAL HIGH (ref 70–99)
Potassium: 2.8 mmol/L — ABNORMAL LOW (ref 3.5–5.1)
Sodium: 132 mmol/L — ABNORMAL LOW (ref 135–145)
Total Bilirubin: 0.5 mg/dL (ref 0.3–1.2)
Total Protein: 4.8 g/dL — ABNORMAL LOW (ref 6.5–8.1)

## 2020-07-28 LAB — CBC WITH DIFFERENTIAL/PLATELET
Abs Immature Granulocytes: 0.03 10*3/uL (ref 0.00–0.07)
Basophils Absolute: 0 10*3/uL (ref 0.0–0.1)
Basophils Relative: 0 %
Eosinophils Absolute: 0.3 10*3/uL (ref 0.0–0.5)
Eosinophils Relative: 6 %
HCT: 30.8 % — ABNORMAL LOW (ref 36.0–46.0)
Hemoglobin: 9.6 g/dL — ABNORMAL LOW (ref 12.0–15.0)
Immature Granulocytes: 1 %
Lymphocytes Relative: 24 %
Lymphs Abs: 1.3 10*3/uL (ref 0.7–4.0)
MCH: 26.9 pg (ref 26.0–34.0)
MCHC: 31.2 g/dL (ref 30.0–36.0)
MCV: 86.3 fL (ref 80.0–100.0)
Monocytes Absolute: 0.6 10*3/uL (ref 0.1–1.0)
Monocytes Relative: 11 %
Neutro Abs: 3.3 10*3/uL (ref 1.7–7.7)
Neutrophils Relative %: 58 %
Platelets: 273 10*3/uL (ref 150–400)
RBC: 3.57 MIL/uL — ABNORMAL LOW (ref 3.87–5.11)
RDW: 18.5 % — ABNORMAL HIGH (ref 11.5–15.5)
WBC: 5.6 10*3/uL (ref 4.0–10.5)
nRBC: 0 % (ref 0.0–0.2)

## 2020-07-28 LAB — MAGNESIUM: Magnesium: 1.5 mg/dL — ABNORMAL LOW (ref 1.7–2.4)

## 2020-07-28 MED ORDER — POTASSIUM CHLORIDE CRYS ER 20 MEQ PO TBCR
40.0000 meq | EXTENDED_RELEASE_TABLET | ORAL | Status: AC
  Administered 2020-07-28 (×2): 40 meq via ORAL
  Filled 2020-07-28 (×2): qty 2

## 2020-07-28 MED ORDER — MAGNESIUM SULFATE 4 GM/100ML IV SOLN
4.0000 g | Freq: Once | INTRAVENOUS | Status: AC
  Administered 2020-07-28: 4 g via INTRAVENOUS
  Filled 2020-07-28: qty 100

## 2020-07-28 NOTE — Plan of Care (Signed)

## 2020-07-28 NOTE — Care Management Important Message (Signed)
Important Message  Patient Details  Name: Suzanne Fox MRN: [id number] Date of Birth: 1930-02-19   Medicare Important Message Given:  Yes     Dorena Bodo 07/28/2020, 3:29 PM

## 2020-07-28 NOTE — TOC Progression Note (Addendum)
Transition of Care [facility]) - Progression Note    Patient Details  Name: TOSCA PLETZ MRN: [id number] Date of Birth: 1929/08/01  Transition of Care [facility]) CM/SW Contact  Ivette Loyal, [place] Phone Number: 07/28/2020, 9:53 AM  Clinical Narrative:    9:50am- [facility]) contacted CSW to provide peer to peer information, CSW shared this info with the Attending Dr. And will follow up after call.  3:00pm- [employer] denied pt auth again for SNF CSW followed up with pt niece and she is going to appeal the decision. CSW also followed up with Olegario Messier at [facility], she provided monthly amount to pay out of pocket and will follow up tomorrow with weekly amount and payment plan if possible. CSW will follow up.   Expected Discharge Plan: Long Term Nursing Home Barriers to Discharge: Continued Medical Work up  Expected Discharge Plan and Services Expected Discharge Plan: Long Term Nursing Home       Living arrangements for the past 2 months: Skilled Nursing Facility Expected Discharge Date: 07/26/20                                     Social Determinants of Health (SDOH) Interventions    Readmission Risk Interventions No flowsheet data found.

## 2020-07-28 NOTE — Progress Notes (Signed)
Progress Note    Suzanne Fox  WCB:[id number] DOB: [birth year]  DOA: 07/13/2020 PCP: Georgianne Fick, MD    Brief Narrative:   Medical records reviewed and are as summarized below:  Suzanne Fox is an 87 y.o. female with PMH significant for HTN, DM2, dementia and recent cervical surgery for hematoma who lives at [facility] healthcare is brought in for somnolence.  Na was found to be extremely low and is being slowly corrected.  Initially on 3% but now on IVF And IV lasix.  Another issue has been Hgb trending down and needing PRBCs. 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, SIADH, Chlorthalidone Previously received 3% saline--nephrology have signed off 4/26 low-dose Lasix  Recent hematoma evacuation from cervical surgery  Anemia of iron deficiency 1 unit PRBC 4/23, Feraheme given 4/27 Heme +,   not a candidate per GI for EGD colonoscopy  Hypothyroidism Synthroid 25 mcg daily  HTN Stopped HCTZ--stop aggressive monitoring  Severe malnutrition Eating 25 to 50% of meals Core-track removed 07/25/2020       Family Communication/Anticipated D/C date and plan/Code Status    Code Status: DNR Disposition Plan: Status is: Inpatient  Remains inpatient appropriate because:Inpatient level of care appropriate due to severity of illness   Dispo: The patient is from: Home              Anticipated d/c is to: Home              Patient currently is not medically stable to d/c.   Difficult to place patient No    Medical Consultants:    Renal  PCCM  Subjective:   No complaints  Objective:    Vitals:   07/27/20 2300 07/28/20 0300 07/28/20 0720 07/28/20 1100  BP:   (!) 160/72 (!) 168/69  Pulse:  81 81 88 88  Resp: 15 16 19 18   Temp:   98.1 F (36.7 C) 98 F (36.7 C)  TempSrc:   Oral Oral  SpO2: 99% 98% 95% 100%  Weight:      Height:        Intake/Output Summary (Last 24 hours) at 07/28/2020 1355 Last data filed at 07/28/2020 1200 Gross per 24 hour  Intake 998.74 ml  Output 2050 ml  Net -1051.26 ml   Filed Weights   07/22/20 0350 07/23/20 0357 07/24/20 0550  Weight: 59.9 kg 56.1 kg 55.6 kg    Exam:  General: Appearance:   elderly female in no acute distress     Lungs:     respirations unlabored  Heart:    Normal heart rate. Normal rhythm. No murmurs, rubs, or gallops.   MS:   All extremities are intact.   Neurologic:   Pleasant and cooperative    Data Reviewed:   I have personally reviewed following labs and imaging studies:  Labs: Labs show the following:   Basic Metabolic Panel: Recent Labs  Lab 07/22/20 0020 07/23/20 0705 07/25/20 2140 07/27/20 0042 07/28/20 0024  NA 129* 132*  --  134* 132*  K 3.1* 3.7  --  3.1* 2.8*  CL 95* 97*  --  99 99  CO2 27 27  --  26 28  GLUCOSE 264* 193*  --  113* 130*  BUN  18 17  --  9 7*  CREATININE 0.41* 0.35*  --  0.35* 0.36*  CALCIUM 8.5* 8.8*  --  9.0 8.7*  MG 1.8  --  1.6*  --  1.5*  PHOS 2.7  --   --   --   --    GFR Estimated Creatinine Clearance: 42.7 mL/min (A) (by C-G formula based on SCr of 0.36 mg/dL (L)). Liver Function Tests: Recent Labs  Lab 07/22/20 0020 07/27/20 0042 07/28/20 0024  AST  --  28 21  ALT  --  36 29  ALKPHOS  --  67 68  BILITOT  --  0.2* 0.5  PROT  --  4.6* 4.8*  ALBUMIN 2.2* 2.3* 2.3*   No results for input(s): LIPASE, AMYLASE in the last 168 hours. No results for input(s): AMMONIA in the last 168 hours. Coagulation profile No results for input(s): INR, PROTIME in the last 168 hours.  CBC: Recent Labs  Lab 07/25/20 2140 07/27/20 0216 07/28/20 0024  WBC  --  6.0 5.6  NEUTROABS  --  3.6 3.3  HGB 8.6* 9.7* 9.6*  HCT 28.1* 30.7* 30.8*  MCV  --  85.3 86.3  PLT  --   309 273   Cardiac Enzymes: No results for input(s): CKTOTAL, CKMB, CKMBINDEX, TROPONINI in the last 168 hours. BNP (last 3 results) No results for input(s): PROBNP in the last 8760 hours. CBG: Recent Labs  Lab 07/23/20 1628 07/23/20 2003 07/24/20 0025 07/24/20 0331 07/24/20 0749  GLUCAP 130* 91 98 107* 121*   D-Dimer: No results for input(s): DDIMER in the last 72 hours. Hgb A1c: No results for input(s): HGBA1C in the last 72 hours. Lipid Profile: No results for input(s): CHOL, HDL, LDLCALC, TRIG, CHOLHDL, LDLDIRECT in the last 72 hours. Thyroid function studies: Recent Labs    07/25/20 2140  TSH 2.866   Anemia work up: No results for input(s): VITAMINB12, FOLATE, FERRITIN, TIBC, IRON, RETICCTPCT in the last 72 hours. Sepsis Labs: Recent Labs  Lab 07/27/20 0216 07/28/20 0024  WBC 6.0 5.6    Microbiology Recent Results (from the past 240 hour(s))  SARS CORONAVIRUS 2 (TAT 6-24 HRS) Nasopharyngeal Nasopharyngeal Swab     Status: None   Collection Time: 07/25/20  1:18 PM   Specimen: Nasopharyngeal Swab  Result Value Ref Range Status   SARS Coronavirus 2 NEGATIVE NEGATIVE Final    Comment: (NOTE) SARS-CoV-2 target nucleic acids are NOT DETECTED.  The SARS-CoV-2 RNA is generally detectable in upper and lower respiratory specimens during the acute phase of infection. Negative results do not preclude SARS-CoV-2 infection, do not rule out co-infections with other pathogens, and should not be used as the sole basis for treatment or other patient management decisions. Negative results must be combined with clinical observations, patient history, and epidemiological information. The expected result is Negative.  Fact Sheet for Patients: HairSlick.no  Fact Sheet for Healthcare Providers: quierodirigir.com  This test is not yet approved or cleared by the [place] FDA and  has been authorized for detection  and/or diagnosis of SARS-CoV-2 by FDA under an Emergency Use Authorization (EUA). This EUA will remain  in effect (meaning this test can be used) for the duration of the COVID-19 declaration under Se ction 564(b)(1) of the Act, 21 U.S.C. section 360bbb-3(b)(1), unless the authorization is terminated or revoked sooner.  Performed at [facility], [address], [place], [place] [address]     Procedures and diagnostic studies:  No results found.  Medications:   .  chlorhexidine  15 mL Mouth Rinse BID  . Chlorhexidine Gluconate Cloth  6 each Topical Q0600  . furosemide  10 mg Oral Daily  . levothyroxine  25 mcg Oral Q0600  . mouth rinse  15 mL Mouth Rinse q12n4p  . memantine  10 mg Oral Daily   Continuous Infusions: . sodium chloride 50 mL/hr at 07/15/20 1036     LOS: 15 days   Joseph Art  [facility]   How to contact the [facility] Attending or Consulting provider 7A - 7P or covering provider during after hours 7P -7A, for this patient?  1. Check the care team in [facility] and look for a) attending/consulting TRH provider listed and b) the [facility] team listed 2. Log into [email or web address] and use [place]'s universal password to access. If you do not have the password, please contact the hospital operator. 3. Locate the [facility] provider you are looking for under [facility] and page to a number that you can be directly reached. 4. If you still have difficulty reaching the provider, please page the [facility] (Director on Call) for the Hospitalists listed on amion for assistance.  07/28/2020, 1:55 PM

## 2020-07-29 DIAGNOSIS — E871 Hypo-osmolality and hyponatremia: Secondary | ICD-10-CM | POA: Diagnosis not present

## 2020-07-29 DIAGNOSIS — L8990 Pressure ulcer of unspecified site, unspecified stage: Secondary | ICD-10-CM | POA: Insufficient documentation

## 2020-07-29 DIAGNOSIS — L89509 Pressure ulcer of unspecified ankle, unspecified stage: Secondary | ICD-10-CM | POA: Insufficient documentation

## 2020-07-29 LAB — CBC WITH DIFFERENTIAL/PLATELET
Abs Immature Granulocytes: 0.03 10*3/uL (ref 0.00–0.07)
Basophils Absolute: 0 10*3/uL (ref 0.0–0.1)
Basophils Relative: 1 %
Eosinophils Absolute: 0.2 10*3/uL (ref 0.0–0.5)
Eosinophils Relative: 4 %
HCT: 30.3 % — ABNORMAL LOW (ref 36.0–46.0)
Hemoglobin: 9.5 g/dL — ABNORMAL LOW (ref 12.0–15.0)
Immature Granulocytes: 1 %
Lymphocytes Relative: 25 %
Lymphs Abs: 1.3 10*3/uL (ref 0.7–4.0)
MCH: 26.9 pg (ref 26.0–34.0)
MCHC: 31.4 g/dL (ref 30.0–36.0)
MCV: 85.8 fL (ref 80.0–100.0)
Monocytes Absolute: 0.7 10*3/uL (ref 0.1–1.0)
Monocytes Relative: 13 %
Neutro Abs: 2.9 10*3/uL (ref 1.7–7.7)
Neutrophils Relative %: 56 %
Platelets: 284 10*3/uL (ref 150–400)
RBC: 3.53 MIL/uL — ABNORMAL LOW (ref 3.87–5.11)
RDW: 18.3 % — ABNORMAL HIGH (ref 11.5–15.5)
WBC: 5.2 10*3/uL (ref 4.0–10.5)
nRBC: 0 % (ref 0.0–0.2)

## 2020-07-29 LAB — URINALYSIS, ROUTINE W REFLEX MICROSCOPIC
Bacteria, UA: NONE SEEN
Bilirubin Urine: NEGATIVE
Glucose, UA: 50 mg/dL — AB
Ketones, ur: NEGATIVE mg/dL
Nitrite: NEGATIVE
Protein, ur: 30 mg/dL — AB
Specific Gravity, Urine: 1.01 (ref 1.005–1.030)
pH: 6 (ref 5.0–8.0)

## 2020-07-29 LAB — COMPREHENSIVE METABOLIC PANEL
ALT: 25 U/L (ref 0–44)
AST: 19 U/L (ref 15–41)
Albumin: 2.3 g/dL — ABNORMAL LOW (ref 3.5–5.0)
Alkaline Phosphatase: 68 U/L (ref 38–126)
Anion gap: 6 (ref 5–15)
BUN: 8 mg/dL (ref 8–23)
CO2: 27 mmol/L (ref 22–32)
Calcium: 8.6 mg/dL — ABNORMAL LOW (ref 8.9–10.3)
Chloride: 99 mmol/L (ref 98–111)
Creatinine, Ser: 0.39 mg/dL — ABNORMAL LOW (ref 0.44–1.00)
GFR, Estimated: >60 mL/min (ref >60)
Glucose, Bld: 144 mg/dL — ABNORMAL HIGH (ref 70–99)
Potassium: 3.7 mmol/L (ref 3.5–5.1)
Sodium: 132 mmol/L — ABNORMAL LOW (ref 135–145)
Total Bilirubin: 0.3 mg/dL (ref 0.3–1.2)
Total Protein: 5 g/dL — ABNORMAL LOW (ref 6.5–8.1)

## 2020-07-29 LAB — CORTISOL: Cortisol, Plasma: 15.5 ug/dL

## 2020-07-29 LAB — PROCALCITONIN: Procalcitonin: <0.1 ng/mL

## 2020-07-29 LAB — RESP PANEL BY RT-PCR (FLU A&B, COVID) ARPGX2
Influenza A by PCR: NEGATIVE
Influenza B by PCR: NEGATIVE
SARS Coronavirus 2 by RT PCR: NEGATIVE

## 2020-07-29 MED ORDER — POTASSIUM CHLORIDE CRYS ER 20 MEQ PO TBCR
20.0000 meq | EXTENDED_RELEASE_TABLET | Freq: Every day | ORAL | Status: DC
  Administered 2020-07-29 – 2020-08-03 (×6): 20 meq via ORAL
  Filled 2020-07-29 (×6): qty 1

## 2020-07-29 MED ORDER — MAGNESIUM OXIDE -MG SUPPLEMENT 400 (240 MG) MG PO TABS
400.0000 mg | ORAL_TABLET | Freq: Two times a day (BID) | ORAL | Status: DC
  Administered 2020-07-29 – 2020-08-06 (×17): 400 mg via ORAL
  Filled 2020-07-29 (×17): qty 1

## 2020-07-29 NOTE — TOC Progression Note (Signed)
Transition of Care [facility]) - Progression Note    Patient Details  Name: Suzanne Fox MRN: [id number] Date of Birth: 04-Aug-1929  Transition of Care [facility]) CM/SW [place], RN Phone Number: 07/29/2020, 1:20 PM  Clinical Narrative:    Case management met with the patient at the bedside for transitions of care.  The patient was transferred to [facility] from [facility] on 07/13/2020 for hyponatremia and malnutrition.  The patient was previously living in a rental home with her twin sister - who is now ill and having meals brought in by niece - Cherrie.  The patient does not have 24 supervision at the home and the niece is currently appealing the denial by [facility] for [facility] placement.  Madilyn Fireman, MSW spoke with the niece and gave her the correct number to contact at [facility] to start the appeal.  The niece previously had incorrect contact number.  The niece states that she has applied for Medicaid for the patient at this time.  The patient currently is weak and deconditioned physically - waiting on PT/OT for reassessment.  The patient has runny nose and shivering with foley present.  COVID and influenza screen were ordered by Erin Hearing, NP and pending discontinuance of foley catheter.  CM and MSW on DTP Team will continue to follow for probable SNF placement and return to [facility] care under [facility] - Medicaid pending in case patient unable to return home and in need of LTC placement.   Expected Discharge Plan: Skilled Nursing Facility Barriers to Discharge: Continued Medical Work up,Family Issues,Insurance Authorization,Unsafe home situation  Expected Discharge Plan and Services Expected Discharge Plan: Skilled Nursing Facility In-house Referral: Clinical Social Work Discharge Planning Services: CM Consult Post Acute Care Choice: [place] Living arrangements for the past 2 months: [place]  (Patient previously lived with elderly, ill twin sister at the home and then transferred to [facility] SNF prior to arrival) Expected Discharge Date: 07/26/20                                     Social Determinants of Health (SDOH) Interventions    Readmission Risk Interventions No flowsheet data found.

## 2020-07-29 NOTE — Progress Notes (Signed)
PT Cancellation Note  Patient Details Name: [name] MRN: [id number] DOB: [birth year]   Cancelled Treatment:    Reason Eval/Treat Not Completed: (P) Other (comment) (attempted 10:15am, pt being transferred to another unit. Attempted again 1648 pm, nursing performing care task for pt. Will continue efforts next date per PT POC as schedule permits.)   [place] 07/29/2020, 5:21 PM

## 2020-07-29 NOTE — Progress Notes (Addendum)
CSW spoke with Olegario Messier at [facility] to discuss this patient's potential return to the facility - Olegario Messier states the patient can return to the facility if insurance authorization is obtained.  Edwin Dada, MSW, LCSW Transitions of Care  Clinical Social Worker II [phone]

## 2020-07-29 NOTE — Progress Notes (Signed)
[facility] PROGRESS NOTE  CAMDEN MAZZAFERRO YTK:[id number] DOB: 12-Mar-1930 DOA: 07/13/2020 PCP: Georgianne Fick, MD   This patient has 2 medical record numbers  Status: Remains inpatient appropriate because:Unsafe d/c plan   Dispo: The patient is from: SNF              Anticipated d/c is to: SNF              Patient currently is medically stable to d/c.   Difficult to place patient Yes-patient previously at skilled nursing facility for rehab.  Insurance has denied return to SNF.  Family appealing.  Niece has also begun Medicaid application process.   Level of care: Med-Surg  Code Status: DNR Family Communication: Niece by telephone on 5/5 DVT prophylaxis: No pharmacological DVT prophylaxis secondary to recent issues with spontaneous epidural hematoma Vaccination status: Unknown  Foley catheter: Yes- placed 4/16 in ER-discontinued 5/5  HPI: 87 y.o. female with PMH significant for HTN, DM2, dementia and recent cervical surgery for hematoma who lives at [facility] healthcare is brought in for somnolence. Na wasfound to be extremely low and is being slowly corrected. Initially on 3% but now on IVF And IV lasix. Another issue has been Hgb trending down and needing PRBCs. Hgb improved with diuresis  Subjective: Awake.  Shivering and complaining of being cold (spoke with patient's niece and this is a chronic issue for patient).  She is also complaining of a runny nose.  No other complaints verbalized.  Objective: Vitals:   07/29/20 0300 07/29/20 0704  BP: (!) 159/64 (!) 177/76  Pulse: 90 100  Resp: 20 15  Temp: 98.7 F (37.1 C) 98.5 F (36.9 C)  SpO2: 100% 100%    Intake/Output Summary (Last 24 hours) at 07/29/2020 5573 Last data filed at 07/28/2020 2131 Gross per 24 hour  Intake 758.74 ml  Output 1450 ml  Net -691.26 ml   Filed Weights   07/22/20 0350 07/23/20 0357 07/24/20 0550  Weight: 59.9 kg 56.1 kg 55.6 kg    Exam:  Constitutional: NAD, calm but  shivering, appears comfortable otherwise, complaining of runny nose Respiratory: clear to auscultation bilaterally, Normal respiratory effort. No accessory muscle use.  Room air Cardiovascular: Regular rate and rhythm, no murmurs / rubs / gallops. No extremity edema. .  Abdomen: no tenderness-Bowel sounds positive. LBM 5/5 Neurologic: CN 2-12 grossly intact. Sensation intact, Strength 4/5 on left.  RLE 4/5 with RUE 3/5. Psychiatric: Awake oriented times name and place.  Pleasant   Assessment/Plan: Acute problems: Severe hyponatremia (multifactorial) 2/2 potomania, SIADH and medications Was on chlorthalidone prior to admission -dc'd Initially received 3% saline per recommendation of nephrology Sodium stable on low-dose Lasix-add daily potassium 20 mEq and repeat labs in a.m.  Recurrent low-grade fever Patient has had intermittent low-grade fevers between 99.3 and 100.4 since admission He did initially for UTI with Rocephin COVID test 4 days ago negative-patient has rhinorrhea we will repeat Patient had abnormal blood cultures on 4/19 and after review attending and pharmacist felt was contamination-obtain follow-up cultures Has had a urinary catheter in place for greater than 19 days.  Will obtain urinalysis and culture; also check procalcitonin   Iron deficiency anemia Given PRBCs on 4/23 and given Feraheme 4/27 She is heme positive but is not a candidate for EGD or colonoscopy per GI Hemoglobin stable at 9.5  Chronic dementia Continue Namenda Consult OT for short Blessed test to determine functional capacity SLP evaluation for SLUMS testing/cognitive evaluation  Hypertension Thiazide diuretic discontinued Supine  blood pressure elevated but recent issues with orthostatic hypotension Repeat orthostatic vital signs-if positive likely would benefit from Florinef  Hypokalemia/hypomagnesemia K+ 3.1> 2.8 > 3.7 Mg+ 1.6 > 1.5 Begin scheduled potassium as above Begin magnesium twice  daily Follow labs intermittently   Diabetes mellitus 2 Diet controlled  Moderate protein calorie malnutrition Nutrition Problem: Moderate Malnutrition Etiology: chronic illness (dementia) Signs/Symptoms: moderate fat depletion,severe muscle depletion Interventions: Tube feeding-cortrack removed 5/1 no longer on tube feedings Variable intake eating 25 to 50% of meals  Physical deconditioning PT recommended SNF Continue PT OT evaluation as well Per niece patient lives with her twin sister in a rental home.  Twin sister also debilitated and unable to prepare food and niece is responsible for caring for both the patient and her twin sister.  Niece verbalizes concerns that patient will require level of care chronically that she will not be able to provide once patient is discharged from the hospital.  She states she has already begun the application process for Medicaid in the event patient short-term rehab does not lead to patient being able to discharge home and thus progresses to custodial care.  Other problems: Recent cervical surgery with complication of hematoma For short-term placement at skilled nursing facility   Data Reviewed: Basic Metabolic Panel: Recent Labs  Lab 07/23/20 0705 07/25/20 2140 07/27/20 0042 07/28/20 0024 07/29/20 0043  NA 132*  --  134* 132* 132*  K 3.7  --  3.1* 2.8* 3.7  CL 97*  --  99 99 99  CO2 27  --  26 28 27   GLUCOSE 193*  --  113* 130* 144*  BUN 17  --  9 7* 8  CREATININE 0.35*  --  0.35* 0.36* 0.39*  CALCIUM 8.8*  --  9.0 8.7* 8.6*  MG  --  1.6*  --  1.5*  --    Liver Function Tests: Recent Labs  Lab 07/27/20 0042 07/28/20 0024 07/29/20 0043  AST 28 21 19   ALT 36 29 25  ALKPHOS 67 68 68  BILITOT 0.2* 0.5 0.3  PROT 4.6* 4.8* 5.0*  ALBUMIN 2.3* 2.3* 2.3*   No results for input(s): LIPASE, AMYLASE in the last 168 hours. No results for input(s): AMMONIA in the last 168 hours. CBC: Recent Labs  Lab 07/25/20 2140 07/27/20 0216  07/28/20 0024 07/29/20 0043  WBC  --  6.0 5.6 5.2  NEUTROABS  --  3.6 3.3 2.9  HGB 8.6* 9.7* 9.6* 9.5*  HCT 28.1* 30.7* 30.8* 30.3*  MCV  --  85.3 86.3 85.8  PLT  --  309 273 284   CBG: Recent Labs  Lab 07/23/20 1628 07/23/20 2003 07/24/20 0025 07/24/20 0331 07/24/20 0749  GLUCAP 130* 91 98 107* 121*       Studies: No results found.  Scheduled Meds: . chlorhexidine  15 mL Mouth Rinse BID  . Chlorhexidine Gluconate Cloth  6 each Topical Q0600  . furosemide  10 mg Oral Daily  . levothyroxine  25 mcg Oral Q0600  . mouth rinse  15 mL Mouth Rinse q12n4p  . memantine  10 mg Oral Daily   Continuous Infusions: . sodium chloride 50 mL/hr at 07/15/20 1036    Principal Problem:   Hyponatremia Active Problems:   Benign essential HTN   Type 2 diabetes mellitus without complication (HCC)   Malnutrition of moderate degree   Consultants:  Nephrology  PCCM  Procedures:  Cortrak tube which has been removed  Antibiotics: Anti-infectives (From admission, onward)  Start     Dose/Rate Route Frequency Ordered Stop   07/13/20 2200  cefTRIAXone (ROCEPHIN) 1 g in sodium chloride 0.9 % 100 mL IVPB        1 g 200 mL/hr over 30 Minutes Intravenous Every 24 hours 07/13/20 2050 07/17/20 2151        Time spent: 35 minutes    Junious Silk ANP  [facility] 7 am - 330 pm/M-F for direct patient care and secure chat Please refer to Amion for contact info 16  days

## 2020-07-29 NOTE — Evaluation (Signed)
Speech Language Pathology Evaluation Patient Details Name: Suzanne Fox MRN: [id number] DOB: 1929/04/02 Today's Date: 07/29/2020 Time: 1450-1510 SLP Time Calculation (min) (ACUTE ONLY): 20 min  Problem List:  Patient Active Problem List   Diagnosis Date Noted  . Pressure injury of skin 07/29/2020  . Malnutrition of moderate degree 07/14/2020  . Hyponatremia 07/13/2020  . Benign essential HTN 07/13/2020  . Type 2 diabetes mellitus without complication (HCC) 07/13/2020   Past Medical History: History reviewed. No pertinent past medical history. Past Surgical History: History reviewed. No pertinent surgical history. HPI:  Suzanne Fox is an 87 y.o. female with PMH significant for HTN, DM2, dementia and recent cervical surgery for hematoma who lives at [facility] healthcare is brought in for somnolence. Kept NPO due to AMS.   Assessment / Plan / Recommendation Clinical Impression  Pt presents with moderate to severe cognitive impairments in setting of dementia, suspected to be at her baseline functioning per chart review. Pt able to provide some information regarding her hx but not always accurate. SLUMS assessment administered, pt scored 1/30, knowing the current state only. Global cognitive deficits exhibited including reduced recall, reduced awareness, disorientation, decreased sequencing, poor executive function skills, and reduced sustained attention. Receptive and expressive language skills as well as motor speech skills appeared grossly intact. Pt will require 24 hour care at DC, recommend SNF. No further ST needs identified.    SLP Assessment  SLP Recommendation/Assessment: Patient does not need any further Speech Lanaguage Pathology Services SLP Visit Diagnosis: Cognitive communication deficit (R41.841)    Follow Up Recommendations  Skilled Nursing facility    Frequency and Duration           SLP Evaluation Cognition  Overall Cognitive Status: History of cognitive  impairments - at baseline Arousal/Alertness: Awake/alert Orientation Level: Oriented to person;Disoriented to time;Disoriented to situation;Disoriented to place Attention: Focused;Sustained Focused Attention: Impaired Focused Attention Impairment: Verbal basic;Functional basic;Verbal complex;Functional complex Sustained Attention: Impaired Sustained Attention Impairment: Verbal basic;Verbal complex;Functional basic;Functional complex Memory: Impaired Memory Impairment: Decreased recall of new information;Decreased long term memory;Decreased short term memory Awareness: Impaired Problem Solving: Impaired Executive Function: Reasoning;Organizing;Self Monitoring Reasoning: Impaired Organizing: Impaired Self Monitoring: Impaired Safety/Judgment: Impaired       Comprehension  Auditory Comprehension Overall Auditory Comprehension: Appears within functional limits for tasks assessed    Expression Expression Primary Mode of Expression: Verbal Verbal Expression Overall Verbal Expression: Appears within functional limits for tasks assessed Written Expression Dominant Hand: Left   Oral / Motor  Oral Motor/Sensory Function Overall Oral Motor/Sensory Function: Generalized oral weakness Motor Speech Overall Motor Speech: Appears within functional limits for tasks assessed   GO                    Ardyth Gal MA, CCC-SLP Acute Rehabilitation Services   07/29/2020, 3:39 PM

## 2020-07-29 NOTE — Evaluation (Signed)
Occupational Therapy Evaluation Patient Details Name: Suzanne Fox MRN: [id number] DOB: 04-May-1929 Today's Date: 07/29/2020    History of Present Illness 87 yo female presents to ED on 4/19 from [facility] with lethargy, AMS, hypotension, hyponatremia. Cortrak placed for nocturnal feeds. PMH significant for HTN, DM2, dementia, tobacco abuse, PAD, OA, recent PNA and UTI, and recent admission 3/17-3/25 with spontaneous epidural hematoma requiring C spine decompression and fusion.   Clinical Impression   Patient admitted for the above diagnosis.  PTA she was residing at a SNF, it is unclear if she was actively participating in ST Rehab with the goal of returning home with her sister, or if she was a long term care resident.  Barriers are listed below.  Currently she is needing +2 for basic transfers, and up to total assist for ADL completion.  Patient will require 24 hour heavy care, and therefore a transition to SNF for a rehab trial is indicated.  It does not appear she will have adequate assistance at home to care for her extensive needs.  Acute OT will continue to attempt to maximize her functional status in the acute setting.      Follow Up Recommendations  SNF    Equipment Recommendations  Wheelchair (measurements OT);Wheelchair cushion (measurements OT)    Recommendations for Other Services       Precautions / Restrictions Precautions Precautions: Fall Restrictions Weight Bearing Restrictions: No      Mobility Bed Mobility Overal bed mobility: Needs Assistance Bed Mobility: Rolling;Supine to Sit;Sit to Sidelying;Sidelying to Sit Rolling: Max assist Sidelying to sit: Max assist     Sit to sidelying: Max assist   Patient Response: Anxious  Transfers                 General transfer comment: patient unable to achieve standing position with one assist.    Balance Overall balance assessment: Needs assistance Sitting-balance support: Feet supported Sitting  balance-Leahy Scale: Poor   Postural control: Posterior lean   Standing balance-Leahy Scale: Zero                             ADL either performed or assessed with clinical judgement   ADL Overall ADL's : Needs assistance/impaired     Grooming: Wash/dry hands;Wash/dry face;Moderate assistance;Bed level   Upper Body Bathing: Maximal assistance;Bed level   Lower Body Bathing: Total assistance;Bed level   Upper Body Dressing : Maximal assistance;Bed level   Lower Body Dressing: Total assistance;Bed level       Toileting- Clothing Manipulation and Hygiene: Total assistance;Bed level       Functional mobility during ADLs: +2 for physical assistance;Maximal assistance       Vision Patient Visual Report: No change from baseline Additional Comments: able to read the clock on the wall, but thought it was 1710 am.     Perception     Praxis      Pertinent Vitals/Pain Pain Assessment: Faces Faces Pain Scale: Hurts little more Pain Location: generalized Pain Descriptors / Indicators: Grimacing;Guarding Pain Intervention(s): Monitored during session     Hand Dominance Left   Extremity/Trunk Assessment Upper Extremity Assessment Upper Extremity Assessment: RUE deficits/detail;Generalized weakness RUE Deficits / Details: R UE is swollen and she is unable to grasp or hold utensils.  Patient MMT is 1/5 to R UE.  Difficulty moving against gravity. RUE Sensation: WNL RUE Coordination: decreased fine motor;decreased gross motor   Lower Extremity Assessment Lower Extremity Assessment:  Defer to PT evaluation   Cervical / Trunk Assessment Cervical / Trunk Assessment: Kyphotic Cervical / Trunk Exceptions: forward head, past C-spine surgery.   Communication Communication Communication: No difficulties   Cognition Arousal/Alertness: Awake/alert Behavior During Therapy: WFL for tasks assessed/performed Overall Cognitive Status: History of cognitive impairments -  at baseline                                 General Comments: Pt with history of dementia.   General Comments       Exercises     Shoulder Instructions      Home Living Family/patient expects to be discharged to:: Private residence Living Arrangements: Other relatives Available Help at Discharge: Family;Available PRN/intermittently Type of Home: House Home Access: Stairs to enter [employer] of Steps: does not remember   Home Layout: One level     Bathroom Shower/Tub: Chief Strategy Officer: Standard     Home Equipment: None   Additional Comments: Patient per the chart was admitted from [facility] - patient denies being at a SNF.  Patient has cognitive deficits.      Prior Functioning/Environment Level of Independence: Needs assistance  Gait / Transfers Assistance Needed: Patient stating she was not walking.  But also denied using a wheelchair. ADL's / Homemaking Assistance Needed: Patient stated she didn't need help with getting washed up and dressed at home.  Does state she had assist from family for groceries and community mobility. Communication / Swallowing Assistance Needed: See ST eval          OT Problem List: Decreased strength;Decreased range of motion;Decreased activity tolerance;Impaired balance (sitting and/or standing);Decreased safety awareness;Decreased cognition;Pain      OT Treatment/Interventions: Self-care/ADL training;Therapeutic exercise;Therapeutic activities;DME and/or AE instruction;Balance training    OT Goals(Current goals can be found in the care plan section) Acute Rehab OT Goals Patient Stated Goal: none stated OT Goal Formulation: Patient unable to participate in goal setting Time For Goal Achievement: 08/12/20 Potential to Achieve Goals: Good ADL Goals Pt Will Perform Eating: with set-up;sitting Pt Will Perform Grooming: with set-up;sitting Additional ADL Goal #1: patient will be Min A  for bed mobility- sit/supine- to increase independence with toileting. Additional ADL Goal #2: Patient will sit edge of bed unsupported for up to 5 min with supervision only to increase independence with ADL.  OT Frequency: Min 2X/week   Barriers to D/C:    none noted       Co-evaluation              AM-PAC OT "6 Clicks" Daily Activity     Outcome Measure Help from another person eating meals?: A Lot Help from another person taking care of personal grooming?: A Lot Help from another person toileting, which includes using toliet, bedpan, or urinal?: Total Help from another person bathing (including washing, rinsing, drying)?: A Lot Help from another person to put on and taking off regular upper body clothing?: A Lot Help from another person to put on and taking off regular lower body clothing?: Total 6 Click Score: 10   End of Session Nurse Communication: Other (comment)  Activity Tolerance: Patient tolerated treatment well Patient left: in bed;with call bell/phone within reach;with bed alarm set  OT Visit Diagnosis: Unsteadiness on feet (R26.81);Muscle weakness (generalized) (M62.81);Other symptoms and signs involving cognitive function;Feeding difficulties (R63.3);Pain  Time: 6811-5726 OT Time Calculation (min): 20 min Charges:  OT General Charges $OT Visit: 1 Visit OT Evaluation $OT Eval Moderate Complexity: 1 Mod  07/29/2020  Rich, OTR/L  Acute Rehabilitation Services  Office:  [phone]   Suzanna Obey 07/29/2020, 5:30 PM

## 2020-07-30 DIAGNOSIS — E871 Hypo-osmolality and hyponatremia: Secondary | ICD-10-CM | POA: Diagnosis not present

## 2020-07-30 LAB — BASIC METABOLIC PANEL
Anion gap: 7 (ref 5–15)
BUN: 9 mg/dL (ref 8–23)
CO2: 27 mmol/L (ref 22–32)
Calcium: 9 mg/dL (ref 8.9–10.3)
Chloride: 99 mmol/L (ref 98–111)
Creatinine, Ser: 0.42 mg/dL — ABNORMAL LOW (ref 0.44–1.00)
GFR, Estimated: >60 mL/min (ref >60)
Glucose, Bld: 159 mg/dL — ABNORMAL HIGH (ref 70–99)
Potassium: 3.8 mmol/L (ref 3.5–5.1)
Sodium: 133 mmol/L — ABNORMAL LOW (ref 135–145)

## 2020-07-30 LAB — PROCALCITONIN: Procalcitonin: <0.1 ng/mL

## 2020-07-30 MED ORDER — FLUDROCORTISONE ACETATE 0.1 MG PO TABS
0.1000 mg | ORAL_TABLET | Freq: Every day | ORAL | Status: DC
  Administered 2020-07-30 – 2020-08-03 (×5): 0.1 mg via ORAL
  Filled 2020-07-30 (×5): qty 1

## 2020-07-30 MED ORDER — TAMSULOSIN HCL 0.4 MG PO CAPS
0.4000 mg | ORAL_CAPSULE | Freq: Every day | ORAL | Status: DC
  Administered 2020-07-30 – 2020-07-31 (×2): 0.4 mg via ORAL
  Filled 2020-07-30 (×2): qty 1

## 2020-07-30 NOTE — Progress Notes (Addendum)
2pm: CSW was notified by patient's niece that Francine [employer]ven was requesting additional information regarding the appeal.  CSW spoke with Amil Amen at [facility] who is requesting a copy of the denial letter be provided to proceed with the appeal.  CSW spoke with Florentina Addison at [facility] who states they are unable to provide CSW with appeal letter that they are only mailed to the patient's home but Amil Amen from [facility] can call the main [employer] number and obtain a verbal confirmation of the denial.  CSW returned call to [employer] to inform her of information - no answer, a voicemail was left informing her of instructions on how to contact [employer] to obtain needed items to proceed.  11am: CSW received notification from patient's niece stating she had spoken with an appeals representative from [facility] - niece to return call to CSW once more information is obtained.  CSW spoke with Olegario Messier at [facility] who states that whenever authorization is obtained the patient can return to the facility.  Edwin Dada, MSW, LCSW Transitions of Care  Clinical Social Worker II [phone]

## 2020-07-30 NOTE — Progress Notes (Signed)
Nutrition Follow-up  DOCUMENTATION CODES:   Non-severe (moderate) malnutrition in context of chronic illness  INTERVENTION:   -Continue Magic cup TID with meals, each supplement provides 290 kcal and 9 grams of protein  NUTRITION DIAGNOSIS:   Moderate Malnutrition related to chronic illness (dementia) as evidenced by moderate fat depletion,severe muscle depletion.  Ongoing  GOAL:   Patient will meet greater than or equal to 90% of their needs  Progressing  MONITOR:   PO intake,Supplement acceptance,Labs,Weight trends,Skin,I & O's  REASON FOR ASSESSMENT:   Consult Enteral/tube feeding initiation and management  ASSESSMENT:   87 year old female who presented to the ED on 4/19 from [facility] with AMS and hypotension. PMH of dementia, HTN, T2DM, and recent cervical surgery for hematoma. Pt admitted with hyponatremia in the setting of suspected dehydration/poor nutrition and use of chlorthalidone as well as acute metabolic encephalopathy.  4/20- Cortrak placed, tip at the pylorus per abdominal x-ray 4/29- calorie count completed- meeting 64% of estimated energy needs 5/1- cortrak removed  Reviewed I/O's: -950 ml x 24 hours and -13.5 L since 07/16/20  UOP: 1.4 L x 24 hours  Pt remains with fair appetite, which has improved since last visit. Noted meal completions 50-60%. Pt and family do not desire means of artifical nutrition/ hydration.   Per TOC notes, pt continues to await SNF placement.   Medications reviewed and include lasix, magnesium oxide, and potassium chloride.   Labs reviewed: Na: 133.   Diet Order:   Diet Order            Diet - low sodium heart healthy           DIET - DYS 1 Room service appropriate? Yes; Fluid consistency: Thin  Diet effective now                 EDUCATION NEEDS:   Not appropriate for education at this time  Skin:  Skin Assessment: Reviewed RN Assessment  Last BM:  07/26/20  Height:   Ht Readings from  Last 1 Encounters:  07/13/20 5\' 5"  (1.651 m)    Weight:   Wt Readings from Last 1 Encounters:  07/24/20 55.6 kg    Ideal Body Weight:  56.8 kg  BMI:  Body mass index is 20.4 kg/m.  Estimated Nutritional Needs:   Kcal:  1550-1750 kcal  Protein:  80-95 grams  Fluid:  >/= 1.5 L/day    07/26/20, RD, LDN, CDCES Registered Dietitian II Certified Diabetes Care and Education Specialist Please refer to [facility] for RD and/or RD on-call/weekend/after hours pager

## 2020-07-30 NOTE — Progress Notes (Addendum)
[facility] PROGRESS NOTE  Suzanne Fox ZTI:[id number] DOB: May 25, 1929 DOA: 07/13/2020 PCP: Georgianne Fick, MD   This patient has 2 medical record numbers  Status: Remains inpatient appropriate because:Unsafe d/c plan   Dispo: The patient is from: SNF              Anticipated d/c is to: SNF              Patient currently is medically stable to d/c.   Difficult to place patient Yes-patient previously at skilled nursing facility for rehab.  Insurance has denied return to SNF.  Family appealing.  Niece has also begun Medicaid application process.   Level of care: Med-Surg  Code Status: DNR Family Communication: Niece by telephone on 5/5 DVT prophylaxis: No pharmacological DVT prophylaxis secondary to recent issues with spontaneous epidural hematoma Vaccination status: Unknown  Foley catheter: Yes- placed 4/16 in ER-discontinued 5/5  HPI: 87 y.o. female with PMH significant for HTN, DM2, dementia and recent cervical surgery for hematoma who lives at [facility] healthcare is brought in for somnolence. Na wasfound to be extremely low and is being slowly corrected. Initially on 3% but now on IVF And IV lasix. Another issue has been Hgb trending down and needing PRBCs. Hgb improved with diuresis  Subjective: Awake.  States to not have sensation of need to void but did complain of discomfort with palpation over suprapubic region.  No specific complaints.  Denies feeling cold.  Objective: Vitals:   07/30/20 0000 07/30/20 0437  BP:  (!) 141/65  Pulse:  92  Resp: 20 18  Temp: 98.5 F (36.9 C) 98.7 F (37.1 C)  SpO2:  97%    Intake/Output Summary (Last 24 hours) at 07/30/2020 1115 Last data filed at 07/30/2020 0900 Gross per 24 hour  Intake --  Output 2000 ml  Net -2000 ml   Filed Weights   07/22/20 0350 07/23/20 0357 07/24/20 0550  Weight: 59.9 kg 56.1 kg 55.6 kg    Exam:  Constitutional: Calm, no acute distress, appears to be Respiratory: Bilateral lung  sounds clear, no increased work of breathing, room air Cardiovascular: Regular pulse, no peripheral edema, no JVD, normal heart.  Abdomen: no tenderness-Bowel sounds positive. LBM 5/5 Neurologic: CN 2-12 grossly intact. Sensation intact, Strength 4/5 on left.  RLE 4/5 with RUE 3/5. Psychiatric: Awake and oriented times name.  Pleasant affect.   Assessment/Plan: Acute problems: Severe hyponatremia (multifactorial) 2/2 potomania, SIADH and medications Was on chlorthalidone prior to admission -dc'd Initially received 3% saline per recommendation of nephrology Sodium stable on low-dose Lasix with potassium 20 mEq-- K 3.8  Recurrent low-grade fever COVID/flu screen negative Procalcitonin normal x2 Urinalysis unremarkable with small leukocytes, no bacteria and WBC 6-10 Blood culture and urine culture pending  Orthostatic hypotension SBP dropped from 120s to 80s during PT session today At times patient has BP readings in the 170s Patient also needs Flomax to help with urinary retention so we will add daily Florinef to see if this improves blood pressure readings/orthostasis Need to consider discontinuation of Lasix as well  Recent urinary retention During previous admission patient had acute urinary retention that required placement of catheter.  Discharging physician documented need to begin bladder training but unclear if this was attempted or accomplished at facility Foley catheter removed on 5/5 and patient continues to have urinary retention Add Flomax Check bladder scans every 6 hours and perform in/out catheterization for volumes >/= 300 cc  Iron deficiency anemia Given PRBCs on 4/23 and given  Feraheme 4/27 She is heme positive but is not a candidate for EGD or colonoscopy per GI Hemoglobin stable at 9.5  Chronic dementia Continue Namenda Appreciate SLP evaluation.  Noted to have moderate to severe cognitive impairments secondary to dementia and is suspected to be at her  baseline.  Scored a 1/30 on SLUMS.  Global cognitive deficits exhibited including reduced recall, reduced awareness, disorientation, decreased sequencing, poor executive function skills and reduced sustained attention receptive and expressive language skills as well as motor speech skills grossly intact.  Recommendation for SNF  Hypertension Thiazide diuretic discontinued Supine blood pressure elevated but recent issues with orthostatic hypotension See above regarding initiation of Florinef  Hypokalemia/hypomagnesemia K+ 3.1> 2.8 > 3.7 Mg+ 1.6 > 1.5 Begin scheduled potassium as above Begin magnesium twice daily Follow labs intermittently   Diabetes mellitus 2 Diet controlled  Moderate protein calorie malnutrition Nutrition Problem: Moderate Malnutrition Etiology: chronic illness (dementia) Signs/Symptoms: moderate fat depletion,severe muscle depletion Interventions: Tube feeding-cortrack removed 5/1 no longer on tube feedings Variable intake eating 25 to 50% of meals  Physical deconditioning PT/OT continue to recommend SNF for rehabilitative therapies   Other problems: Recent cervical surgery with complication of hematoma For short-term placement at skilled nursing facility   Data Reviewed: Basic Metabolic Panel: Recent Labs  Lab 07/25/20 2140 07/27/20 0042 07/28/20 0024 07/29/20 0043 07/30/20 0233  NA  --  134* 132* 132* 133*  K  --  3.1* 2.8* 3.7 3.8  CL  --  99 99 99 99  CO2  --  26 28 27 27   GLUCOSE  --  113* 130* 144* 159*  BUN  --  9 7* 8 9  CREATININE  --  0.35* 0.36* 0.39* 0.42*  CALCIUM  --  9.0 8.7* 8.6* 9.0  MG 1.6*  --  1.5*  --   --    Liver Function Tests: Recent Labs  Lab 07/27/20 0042 07/28/20 0024 07/29/20 0043  AST 28 21 19   ALT 36 29 25  ALKPHOS 67 68 68  BILITOT 0.2* 0.5 0.3  PROT 4.6* 4.8* 5.0*  ALBUMIN 2.3* 2.3* 2.3*   No results for input(s): LIPASE, AMYLASE in the last 168 hours. No results for input(s): AMMONIA in the last 168  hours. CBC: Recent Labs  Lab 07/25/20 2140 07/27/20 0216 07/28/20 0024 07/29/20 0043  WBC  --  6.0 5.6 5.2  NEUTROABS  --  3.6 3.3 2.9  HGB 8.6* 9.7* 9.6* 9.5*  HCT 28.1* 30.7* 30.8* 30.3*  MCV  --  85.3 86.3 85.8  PLT  --  309 273 284   CBG: Recent Labs  Lab 07/23/20 1628 07/23/20 2003 07/24/20 0025 07/24/20 0331 07/24/20 0749  GLUCAP 130* 91 98 107* 121*       Studies: No results found.  Scheduled Meds: . chlorhexidine  15 mL Mouth Rinse BID  . Chlorhexidine Gluconate Cloth  6 each Topical Q0600  . furosemide  10 mg Oral Daily  . levothyroxine  25 mcg Oral Q0600  . magnesium oxide  400 mg Oral BID  . mouth rinse  15 mL Mouth Rinse q12n4p  . memantine  10 mg Oral Daily  . potassium chloride  20 mEq Oral Daily  . tamsulosin  0.4 mg Oral QPC breakfast   Continuous Infusions: . sodium chloride 50 mL/hr at 07/15/20 1036    Principal Problem:   Hyponatremia Active Problems:   Benign essential HTN   Type 2 diabetes mellitus without complication (HCC)   Malnutrition of moderate  degree   Pressure injury of skin   Consultants:  Nephrology  PCCM  Procedures:  Cortrak tube which has been removed  Antibiotics: Anti-infectives (From admission, onward)   Start     Dose/Rate Route Frequency Ordered Stop   07/13/20 2200  cefTRIAXone (ROCEPHIN) 1 g in sodium chloride 0.9 % 100 mL IVPB        1 g 200 mL/hr over 30 Minutes Intravenous Every 24 hours 07/13/20 2050 07/17/20 2151       Time spent: 35 minutes    Junious Silk ANP  [facility] 7 am - 330 pm/M-F for direct patient care and secure chat Please refer to Amion for contact info 17  days

## 2020-07-30 NOTE — Progress Notes (Signed)
Physical Therapy Treatment Patient Details Name: Suzanne Fox MRN: [id number] DOB: 1929-10-06 Today's Date: 07/30/2020    History of Present Illness 87 yo female presents to ED on 4/19 from [facility] with lethargy, AMS, hypotension, hyponatremia. Cortrak placed for nocturnal feeds. PMH significant for HTN, DM2, dementia, tobacco abuse, PAD, OA, recent PNA and UTI, and recent admission 3/17-3/25 with spontaneous epidural hematoma requiring C spine decompression and fusion.    PT Comments    Patient received in bed, very confused and A&Ox2 but pleasant and cooperative. Difficult to get her to initiate movement today, so required totalAx2 to get to EOB and then needed basically totalA to maintain upright due to heavy posterior lean as well. Orthostatics taken and as follows:  115/51 in bed at beginning of session   89/51 sitting EOB   83/55 sitting  EOB x3 min   Back to supine 128/53   HOB at 30 degrees 105/47     Session limited due to blood pressures- patient left in bed with alarm active and RN aware of patient status/BPs. Continue to recommend SNF.      Follow Up Recommendations  SNF     Equipment Recommendations  None recommended by PT    Recommendations for Other Services       Precautions / Restrictions Precautions Precautions: Fall Restrictions Weight Bearing Restrictions: No    Mobility  Bed Mobility Overal bed mobility: Needs Assistance Bed Mobility: Supine to Sit;Sit to Supine     Supine to sit: Total assist;+2 for physical assistance;HOB elevated Sit to supine: Total assist;+2 for physical assistance;HOB elevated   General bed mobility comments: very little initiation despite heavy multimodal cues- ultimately needed totalAx2 to get to/from EOB    Transfers                 General transfer comment: deferred- orthostatic  Ambulation/Gait             General Gait Details: unable   Stairs             Wheelchair  Mobility    Modified Rankin (Stroke Patients Only)       Balance Overall balance assessment: Needs assistance Sitting-balance support: Feet supported;Bilateral upper extremity supported Sitting balance-Leahy Scale: Zero Sitting balance - Comments: totalA to maintain upright at EOB, very heavy posterior lean Postural control: Posterior lean                                  Cognition Arousal/Alertness: Awake/alert Behavior During Therapy: WFL for tasks assessed/performed Overall Cognitive Status: History of cognitive impairments - at baseline                                 General Comments: Pt with history of dementia, but pleasant and followed commands      Exercises      General Comments General comments (skin integrity, edema, etc.): large orthostatic drop- see note for details      Pertinent Vitals/Pain Pain Assessment: Faces Faces Pain Scale: No hurt Pain Intervention(s): Limited activity within patient's tolerance;Monitored during session    Home Living                      Prior Function            PT Goals (current goals can now be found in the  care plan section) Acute Rehab PT Goals Patient Stated Goal: none stated PT Goal Formulation: With patient Time For Goal Achievement: 08/13/20 Potential to Achieve Goals: Fair Progress towards PT goals: Not progressing toward goals - comment (limited by orthostatics today)    Frequency    Min 2X/week      PT Plan Current plan remains appropriate    Co-evaluation              AM-PAC PT "6 Clicks" Mobility   Outcome Measure  Help needed turning from your back to your side while in a flat bed without using bedrails?: A Lot Help needed moving from lying on your back to sitting on the side of a flat bed without using bedrails?: Total Help needed moving to and from a bed to a chair (including a wheelchair)?: Total Help needed standing up from a chair using your arms  (e.g., wheelchair or bedside chair)?: Total Help needed to walk in hospital room?: Total Help needed climbing 3-5 steps with a railing? : Total 6 Click Score: 7    End of Session   Activity Tolerance: Treatment limited secondary to medical complications (Comment) (orthostatics) Patient left: in bed;with call bell/phone within reach;with bed alarm set Nurse Communication: Mobility status;Other (comment) (BP during session) PT Visit Diagnosis: Other abnormalities of gait and mobility (R26.89);Muscle weakness (generalized) (M62.81)     Time: 5427-0623 PT Time Calculation (min) (ACUTE ONLY): 18 min  Charges:  $Therapeutic Activity: 8-22 mins                     Madelaine Etienne, DPT, PN1   Supplemental Physical Therapist [facility]    Pager [phone] Acute Rehab Office [phone]

## 2020-07-31 LAB — URINE CULTURE: Culture: >=100000 — AB

## 2020-07-31 LAB — PROCALCITONIN: Procalcitonin: <0.1 ng/mL

## 2020-07-31 MED ORDER — SODIUM CHLORIDE 0.9 % IV SOLN: 510.0000 mg | Freq: Once | INTRAVENOUS | Status: DC

## 2020-07-31 MED ORDER — SODIUM CHLORIDE 0.9 % IV SOLN
510.0000 mg | Freq: Once | INTRAVENOUS | Status: AC
  Administered 2020-07-31: 510 mg via INTRAVENOUS
  Filled 2020-07-31 (×2): qty 17

## 2020-07-31 MED ORDER — TAMSULOSIN HCL 0.4 MG PO CAPS
0.4000 mg | ORAL_CAPSULE | Freq: Every day | ORAL | Status: DC
  Administered 2020-07-31 – 2020-08-06 (×7): 0.4 mg via ORAL
  Filled 2020-07-31 (×7): qty 1

## 2020-07-31 NOTE — Progress Notes (Signed)
Patient continues to require I/O caths.  Will continue to bladder scan and if needs another I/O cath will plan to replace foley until patient becomes more mobile.  Will add TED hose and abdominal binder when patient works with PT again.   Marlin Canary DO

## 2020-08-01 LAB — MAGNESIUM: Magnesium: 1.8 mg/dL (ref 1.7–2.4)

## 2020-08-01 NOTE — Progress Notes (Signed)
Patient on the LLS team.    Had to replace foley until patient becomes more mobile.  Doubt urinary retention related to recent cervical spine surgery as patient has intact sensation/movement in LE.  Will add TED hose and abdominal binder when patient works with PT again for orthostatic hypotension.  NEEDS OUT OF BED SEVERAL TIMES/DAY  Marlin Canary DO

## 2020-08-01 NOTE — Plan of Care (Signed)

## 2020-08-02 ENCOUNTER — Inpatient Hospital Stay (HOSPITAL_COMMUNITY): Payer: Medicare PPO

## 2020-08-02 DIAGNOSIS — M79601 Pain in right arm: Secondary | ICD-10-CM

## 2020-08-02 DIAGNOSIS — M7989 Other specified soft tissue disorders: Secondary | ICD-10-CM

## 2020-08-02 DIAGNOSIS — E871 Hypo-osmolality and hyponatremia: Secondary | ICD-10-CM | POA: Diagnosis not present

## 2020-08-02 LAB — BASIC METABOLIC PANEL
Anion gap: 7 (ref 5–15)
BUN: 8 mg/dL (ref 8–23)
CO2: 27 mmol/L (ref 22–32)
Calcium: 9.1 mg/dL (ref 8.9–10.3)
Chloride: 100 mmol/L (ref 98–111)
Creatinine, Ser: 0.43 mg/dL — ABNORMAL LOW (ref 0.44–1.00)
GFR, Estimated: >60 mL/min (ref >60)
Glucose, Bld: 149 mg/dL — ABNORMAL HIGH (ref 70–99)
Potassium: 3.5 mmol/L (ref 3.5–5.1)
Sodium: 134 mmol/L — ABNORMAL LOW (ref 135–145)

## 2020-08-02 MED ORDER — SODIUM CHLORIDE 0.9 % IV SOLN
1.5000 g | Freq: Four times a day (QID) | INTRAVENOUS | Status: DC
  Administered 2020-08-02: 1.5 g via INTRAVENOUS
  Filled 2020-08-02 (×2): qty 4

## 2020-08-02 MED ORDER — SODIUM CHLORIDE 0.9 % IV SOLN
1.0000 g | Freq: Four times a day (QID) | INTRAVENOUS | Status: DC
  Administered 2020-08-02 – 2020-08-06 (×17): 1 g via INTRAVENOUS
  Filled 2020-08-02 (×15): qty 1000
  Filled 2020-08-02: qty 1
  Filled 2020-08-02 (×4): qty 1000

## 2020-08-02 NOTE — Progress Notes (Addendum)
[facility] PROGRESS NOTE  Suzanne Fox UYQ:[id number] DOB: Jan 30, 1930 DOA: 07/13/2020 PCP: Georgianne Fick, MD   This patient has 2 medical record numbers  Status: Remains inpatient appropriate because:Unsafe d/c plan   Dispo: The patient is from: SNF              Anticipated d/c is to: SNF              Patient currently is not medically stable to d/c.  Currently being worked up for DVT, continues to have a high level of care in context of recurrent orthostatic hypotension, IV antibiotics for UTI and bladder training in hopes of eventual discontinuation of Foley catheter; also patient is max 2+ physical assistance   Difficult to place patient Yes-patient previously at skilled nursing facility for rehab.  Insurance has denied return to SNFx 2. Niece has also begun Medicaid application process.   Level of care: Med-Surg  Code Status: DNR Family Communication: Niece by telephone on 5/9 DVT prophylaxis: No pharmacological DVT prophylaxis secondary to recent issues with spontaneous epidural hematoma Vaccination status: Unknown  Foley catheter: Yes- placed 4/16 in ER-discontinued 5/5  HPI: 87 y.o. female with PMH significant for HTN, DM2, dementia and recent cervical surgery for hematoma who was residing at [facility] receiving rehabilitative therapies.  She was brought to the ER secondary to somnolence.Na wasfound to be extremely low and is being slowly corrected. Initially on 3% saline and subsequently was transitioned to short-term IV fluid with IV Lasix.  Currently stable on oral Lasix.  Patient has been having issues with intermittent recurrent low-grade fevers.  Urine culture positive for VRE.  RUE swelling with negative duplex study.  Subjective: Awake.  Pleasantly confused.  Denies pain.  Objective: Vitals:   08/01/20 2037 08/02/20 0443  BP: (!) 178/80 (!) 166/71  Pulse: 97 85  Resp: 17 18  Temp: 98.9 F (37.2 C) 98.5 F (36.9 C)  SpO2: 99% 100%     Intake/Output Summary (Last 24 hours) at 08/02/2020 0824 Last data filed at 08/02/2020 0506 Gross per 24 hour  Intake --  Output 1205 ml  Net -1205 ml   Filed Weights   07/22/20 0350 07/23/20 0357 07/24/20 0550  Weight: 59.9 kg 56.1 kg 55.6 kg    Exam:  Constitutional: Awake, calm, denies pain. Respiratory: Lungs are clear, room air Cardiovascular: Normal heart sounds, no tachycardia, focal right upper extremity edema with some scattered areas of focal redness.  Tender to touch. Abdomen: LBM 5/8, soft and nontender with normoactive bowel sounds Genitourinary: Foley catheter in place Neurologic: CN 2-12 grossly intact. Sensation intact, Strength 4/5 on left.  RLE 4/5 with RUE 3/5. Psychiatric: Awake and oriented times name only.  Pleasant affect.   Assessment/Plan: Acute problems: Severe hyponatremia (multifactorial) 2/2 potomania, SIADH and medications Was on chlorthalidone prior to admission -dc'd Initially received 3% saline per recommendation of nephrology Sodium stable on low-dose Lasix with potassium 20 mEq-- K 3.8  VRE CAUTI Sensitive to ampicillin so started on IV ampicillin 5/9  Right upper extremity edema Venous duplex negative for DVT  Orthostatic hypotension Continue Flomax Continue PT OT  Recurrent urinary retention During previous admission patient had acute urinary retention that required placement of catheter.  Discharging physician documented need to begin bladder training but unclear if this was attempted or accomplished at facility Only DC'd on 5/5 but continued to have urinary retention so was replaced on 5/8-possibly acute UTI contribute 5/9 begin bladder training alternating between clamping Foley and allowing  straight drain every 4 hours Continue Flomax  Iron deficiency anemia Given PRBCs on 4/23 and given Feraheme 4/27 She is heme positive but is not a candidate for EGD or colonoscopy per GI Hemoglobin stable at 9.5  Chronic  dementia Continue Namenda Appreciate SLP evaluation.  Noted to have moderate to severe cognitive impairments secondary to dementia and is suspected to be at her baseline.  Scored a 1/30 on SLUMS.  Global cognitive deficits exhibited including reduced recall, reduced awareness, disorientation, decreased sequencing, poor executive function skills and reduced sustained attention receptive and expressive language skills as well as motor speech skills grossly intact.  Recommendation for SNF  Hypertension Thiazide diuretic discontinued Supine blood pressure elevated but recent issues with orthostatic hypotension See above regarding initiation of Florinef  Hypokalemia/hypomagnesemia K+ 3.1> 2.8 > 3.7 Mg+ 1.6 > 1.5 Begin scheduled potassium as above Begin magnesium twice daily Follow labs intermittently   Diabetes mellitus 2 Diet controlled  Moderate protein calorie malnutrition Nutrition Problem: Moderate Malnutrition Etiology: chronic illness (dementia) Signs/Symptoms: moderate fat depletion,severe muscle depletion Interventions: Tube feeding-cortrack removed 5/1 no longer on tube feedings Variable intake eating 25 to 50% of meals  Physical deconditioning PT/OT continue to recommend SNF for rehabilitative therapies Continues to experience symptomatic orthostasis when moving from supine to sitting and sitting to briefly standing Patient very confused but pleasant and cooperative with recent PT efforts.  Difficult to get her to initiate movements at times and requires total assist x2 to get out of bed and to maintain upright due to heavy posterior lean   Other problems: Recent cervical surgery with complication of hematoma For short-term placement at skilled nursing facility   Data Reviewed: Basic Metabolic Panel: Recent Labs  Lab 07/27/20 0042 07/28/20 0024 07/29/20 0043 07/30/20 0233 08/01/20 0021 08/02/20 0011  NA 134* 132* 132* 133*  --  134*  K 3.1* 2.8* 3.7 3.8  --  3.5   CL 99 99 99 99  --  100  CO2 26 28 27 27   --  27  GLUCOSE 113* 130* 144* 159*  --  149*  BUN 9 7* 8 9  --  8  CREATININE 0.35* 0.36* 0.39* 0.42*  --  0.43*  CALCIUM 9.0 8.7* 8.6* 9.0  --  9.1  MG  --  1.5*  --   --  1.8  --    Liver Function Tests: Recent Labs  Lab 07/27/20 0042 07/28/20 0024 07/29/20 0043  AST 28 21 19   ALT 36 29 25  ALKPHOS 67 68 68  BILITOT 0.2* 0.5 0.3  PROT 4.6* 4.8* 5.0*  ALBUMIN 2.3* 2.3* 2.3*   No results for input(s): LIPASE, AMYLASE in the last 168 hours. No results for input(s): AMMONIA in the last 168 hours. CBC: Recent Labs  Lab 07/27/20 0216 07/28/20 0024 07/29/20 0043  WBC 6.0 5.6 5.2  NEUTROABS 3.6 3.3 2.9  HGB 9.7* 9.6* 9.5*  HCT 30.7* 30.8* 30.3*  MCV 85.3 86.3 85.8  PLT 309 273 284   CBG: No results for input(s): GLUCAP in the last 168 hours.     Studies: No results found.  Scheduled Meds: . chlorhexidine  15 mL Mouth Rinse BID  . Chlorhexidine Gluconate Cloth  6 each Topical Q0600  . fludrocortisone  0.1 mg Oral Daily  . furosemide  10 mg Oral Daily  . levothyroxine  25 mcg Oral Q0600  . magnesium oxide  400 mg Oral BID  . mouth rinse  15 mL Mouth Rinse q12n4p  .  memantine  10 mg Oral Daily  . potassium chloride  20 mEq Oral Daily  . tamsulosin  0.4 mg Oral QPC supper   Continuous Infusions: . sodium chloride 50 mL/hr at 07/15/20 1036  . ampicillin-sulbactam (UNASYN) IV      Principal Problem:   Hyponatremia Active Problems:   Benign essential HTN   Type 2 diabetes mellitus without complication (HCC)   Malnutrition of moderate degree   Pressure injury of skin   Consultants:  Nephrology  PCCM  Procedures:  Cortrak tube which has been removed  Antibiotics: Anti-infectives (From admission, onward)   Start     Dose/Rate Route Frequency Ordered Stop   08/02/20 0915  ampicillin-sulbactam (UNASYN) 1.5 g in sodium chloride 0.9 % 100 mL IVPB        1.5 g 200 mL/hr over 30 Minutes Intravenous Every 6  hours 08/02/20 0823     07/13/20 2200  cefTRIAXone (ROCEPHIN) 1 g in sodium chloride 0.9 % 100 mL IVPB        1 g 200 mL/hr over 30 Minutes Intravenous Every 24 hours 07/13/20 2050 07/17/20 2151       Time spent: 20 minutes    Junious Silk ANP  [facility] 7 am - 330 pm/M-F for direct patient care and secure chat Please refer to Amion for contact info 20  days

## 2020-08-02 NOTE — Progress Notes (Addendum)
Patient complaining of R arm pain. Arm appears slightly swollen and pink. Will notify provider. It appears that patient had a peripheral IV in this arm that was removed 5/7.

## 2020-08-02 NOTE — Progress Notes (Signed)
CSW spoke with Gigi Gin at [facility] who states the appeal of the denial was upheld due to the patient being at custodial level of care.  CSW spoke with patient's niece Cherrie to provide her with update - discussed insurance denial and patient being at her baseline. CSW explained that home health services would be arrange prior to discharge, including PT, OT, RN, MSW, and aide (if available). Niece upset by information stating the patient lives with her sister who also needs assistance with all ADL's. Niece states patient has no equipment at home due to her being mobile prior to hospitalization.   NP working on bladder training and on RUE edema.  Edwin Dada, MSW, LCSW Transitions of Care  Clinical Social Worker II [phone]

## 2020-08-02 NOTE — Progress Notes (Signed)
Upper extremity venous has been completed.   Preliminary results in CV Proc.   Blanch Media 08/02/2020 2:39 PM

## 2020-08-03 LAB — CBC WITH DIFFERENTIAL/PLATELET
Abs Immature Granulocytes: 0.04 10*3/uL (ref 0.00–0.07)
Basophils Absolute: 0 10*3/uL (ref 0.0–0.1)
Basophils Relative: 0 %
Eosinophils Absolute: 0.1 10*3/uL (ref 0.0–0.5)
Eosinophils Relative: 1 %
HCT: 34.4 % — ABNORMAL LOW (ref 36.0–46.0)
Hemoglobin: 10.7 g/dL — ABNORMAL LOW (ref 12.0–15.0)
Immature Granulocytes: 1 %
Lymphocytes Relative: 10 %
Lymphs Abs: 0.8 10*3/uL (ref 0.7–4.0)
MCH: 26.7 pg (ref 26.0–34.0)
MCHC: 31.1 g/dL (ref 30.0–36.0)
MCV: 85.8 fL (ref 80.0–100.0)
Monocytes Absolute: 0.6 10*3/uL (ref 0.1–1.0)
Monocytes Relative: 9 %
Neutro Abs: 5.9 10*3/uL (ref 1.7–7.7)
Neutrophils Relative %: 79 %
Platelets: 291 10*3/uL (ref 150–400)
RBC: 4.01 MIL/uL (ref 3.87–5.11)
RDW: 17.1 % — ABNORMAL HIGH (ref 11.5–15.5)
WBC: 7.4 10*3/uL (ref 4.0–10.5)
nRBC: 0 % (ref 0.0–0.2)

## 2020-08-03 LAB — RETICULOCYTES
Immature Retic Fract: 10.8 % (ref 2.3–15.9)
RBC.: 3.95 MIL/uL (ref 3.87–5.11)
Retic Count, Absolute: 141.8 10*3/uL (ref 19.0–186.0)
Retic Ct Pct: 3.6 % — ABNORMAL HIGH (ref 0.4–3.1)

## 2020-08-03 LAB — CULTURE, BLOOD (ROUTINE X 2)
Culture: NO GROWTH
Culture: NO GROWTH
Special Requests: ADEQUATE
Special Requests: ADEQUATE

## 2020-08-03 LAB — IRON AND TIBC
Iron: 82 ug/dL (ref 28–170)
Saturation Ratios: 30 % (ref 10.4–31.8)
TIBC: 276 ug/dL (ref 250–450)
UIBC: 194 ug/dL

## 2020-08-03 LAB — VITAMIN B12: Vitamin B-12: 297 pg/mL (ref 180–914)

## 2020-08-03 LAB — BASIC METABOLIC PANEL
Anion gap: 6 (ref 5–15)
BUN: 9 mg/dL (ref 8–23)
CO2: 27 mmol/L (ref 22–32)
Calcium: 9.2 mg/dL (ref 8.9–10.3)
Chloride: 99 mmol/L (ref 98–111)
Creatinine, Ser: 0.4 mg/dL — ABNORMAL LOW (ref 0.44–1.00)
GFR, Estimated: >60 mL/min (ref >60)
Glucose, Bld: 264 mg/dL — ABNORMAL HIGH (ref 70–99)
Potassium: 3.3 mmol/L — ABNORMAL LOW (ref 3.5–5.1)
Sodium: 132 mmol/L — ABNORMAL LOW (ref 135–145)

## 2020-08-03 LAB — FOLATE: Folate: 9 ng/mL (ref >5.9)

## 2020-08-03 LAB — FERRITIN: Ferritin: 654 ng/mL — ABNORMAL HIGH (ref 11–307)

## 2020-08-03 MED ORDER — DICLOFENAC SODIUM 1 % EX GEL
2.0000 g | Freq: Four times a day (QID) | CUTANEOUS | Status: DC
  Administered 2020-08-03 – 2020-08-06 (×13): 2 g via TOPICAL
  Filled 2020-08-03: qty 100

## 2020-08-03 MED ORDER — FLUDROCORTISONE ACETATE 0.1 MG PO TABS
0.1000 mg | ORAL_TABLET | Freq: Two times a day (BID) | ORAL | Status: DC
  Administered 2020-08-03 – 2020-08-06 (×6): 0.1 mg via ORAL
  Filled 2020-08-03 (×7): qty 1

## 2020-08-03 MED ORDER — POTASSIUM CHLORIDE CRYS ER 20 MEQ PO TBCR
40.0000 meq | EXTENDED_RELEASE_TABLET | Freq: Once | ORAL | Status: AC
  Administered 2020-08-03: 40 meq via ORAL
  Filled 2020-08-03: qty 2

## 2020-08-03 NOTE — Progress Notes (Signed)
Physical Therapy Treatment Patient Details Name: Suzanne Fox MRN: [id number] DOB: [birth year] Today's Date: 08/03/2020    History of Present Illness 87 yo female presents to ED on 4/19 from [facility] with lethargy, AMS, hypotension, hyponatremia. Cortrak placed for nocturnal feeds. PMH significant for HTN, DM2, dementia, tobacco abuse, PAD, OA, recent PNA and UTI, and recent admission 3/17-3/25 with spontaneous epidural hematoma requiring C spine decompression and fusion.    PT Comments    Patient received in bed, very confused- able to tell me she is in a hospital and her name only but pleasant and cooperative. Still requires heavy physical assist of two persons for all attempts at mobility today. Orthostatics negative during today's session, however suspect she is developing B knee flexion contractures (L>R)- plan to contact MD about appropriate bracing. Noted to be having active BM at EOS, bedpan not in room but nursing staff notified of patient status.Left in bed with all needs met, bed alarm active. Unfortunately SNF has been declined by insurance- see below for updated equipment recommendations.   Follow Up Recommendations  Home health PT;Supervision/Assistance - 24 hour     Equipment Recommendations  Hospital bed;Wheelchair (measurements PT);Wheelchair cushion (measurements PT);Other (comment);3in1 (PT) (hoyer lift, foam WC cushion)    Recommendations for Other Services       Precautions / Restrictions Precautions Precautions: Fall Precaution Comments: possible new L knee contracture Restrictions Weight Bearing Restrictions: No    Mobility  Bed Mobility Overal bed mobility: Needs Assistance Bed Mobility: Supine to Sit;Sit to Supine Rolling: Max assist   Supine to sit: Max assist;+2 for physical assistance;HOB elevated Sit to supine: Max assist;+2 for physical assistance   General bed mobility comments: heavy physical assistance for all tasks- still very  confused and unable to effectively initiate movements    Transfers Overall transfer level: Needs assistance Equipment used: 2 person hand held assist Transfers: Sit to/from Stand Sit to Stand: +2 physical assistance;Max assist         General transfer comment: maxAx2 to partially clear hips from bed, unable to tolerate full upright standing  Ambulation/Gait             General Gait Details: unable   Stairs             Wheelchair Mobility    Modified Rankin (Stroke Patients Only)       Balance Overall balance assessment: Needs assistance Sitting-balance support: Feet supported;Bilateral upper extremity supported Sitting balance-Leahy Scale: Poor Sitting balance - Comments: initially totalA to maintain upright at EOB, improved to being able to maintain midline sitting for short bouts Postural control: Posterior lean   Standing balance-Leahy Scale: Zero Standing balance comment: reliant on external support                            Cognition Arousal/Alertness: Awake/alert Behavior During Therapy: Anxious Overall Cognitive Status: History of cognitive impairments - at baseline                                 General Comments: hx of dementia, often states "I don't know" and needs encouragement to try tasks, able to tell me she is in the hospital and her birthday but nothing else A&O related      Exercises      General Comments        Pertinent Vitals/Pain Pain Assessment: Faces Faces Pain Scale:  Hurts whole lot Pain Location: L knee when trying to straighten it actively and passively Pain Descriptors / Indicators: Grimacing;Guarding;Moaning Pain Intervention(s): Limited activity within patient's tolerance;Monitored during session    Home Living                      Prior Function            PT Goals (current goals can now be found in the care plan section) Acute Rehab PT Goals Patient Stated Goal: none  stated PT Goal Formulation: With patient Time For Goal Achievement: 08/13/20 Potential to Achieve Goals: Fair Progress towards PT goals: Progressing toward goals (very slowly)    Frequency    Min 2X/week      PT Plan Discharge plan needs to be updated;Equipment recommendations need to be updated    Co-evaluation              AM-PAC PT "6 Clicks" Mobility   Outcome Measure  Help needed turning from your back to your side while in a flat bed without using bedrails?: A Lot Help needed moving from lying on your back to sitting on the side of a flat bed without using bedrails?: Total Help needed moving to and from a bed to a chair (including a wheelchair)?: Total Help needed standing up from a chair using your arms (e.g., wheelchair or bedside chair)?: Total Help needed to walk in hospital room?: Total Help needed climbing 3-5 steps with a railing? : Total 6 Click Score: 7    End of Session   Activity Tolerance: Patient tolerated treatment well Patient left: in bed;with call bell/phone within reach;with bed alarm set Nurse Communication: Mobility status;Other (comment) (having active BM) PT Visit Diagnosis: Other abnormalities of gait and mobility (R26.89);Muscle weakness (generalized) (M62.81)     Time: 1561-5379 PT Time Calculation (min) (ACUTE ONLY): 21 min  Charges:  $Therapeutic Activity: 8-22 mins                     Windell Norfolk, DPT, PN1   Supplemental Physical Therapist [place]    Pager [phone] Acute Rehab Office [phone]

## 2020-08-03 NOTE — Progress Notes (Signed)
Orthopedic Tech Progress Note Patient Details:  Suzanne Fox 03-27-1930 [id number]  Ortho Devices Type of Ortho Device: Knee Immobilizer Ortho Device/Splint Location: BLE Ortho Device/Splint Interventions: Ordered,Application,Adjustment   Post Interventions Patient Tolerated: Well Instructions Provided: Care of device   [name] 08/03/2020, 6:16 PM

## 2020-08-03 NOTE — Progress Notes (Addendum)
[facility] PROGRESS NOTE  Suzanne Fox VCB:[id number] DOB: [birth year] DOA: 07/13/2020 PCP: Georgianne Fick, MD   This patient has 2 medical record numbers  Status: Remains inpatient appropriate because:Unsafe d/c plan   Dispo: The patient is from: SNF              Anticipated d/c is to: SNF              Patient currently is not medically stable to d/c.  Currently being worked up for DVT, continues to have a high level of care in context of recurrent orthostatic hypotension, IV antibiotics for UTI and bladder training in hopes of eventual discontinuation of Foley catheter; also patient is max 2+ physical assistance   Difficult to place patient Yes-patient previously at skilled nursing facility for rehab.  Insurance has denied return to SNFx 2. Niece has also begun Medicaid application process.   Level of care: Med-Surg  Code Status: DNR Family Communication: Niece by telephone on 5/9 DVT prophylaxis: No pharmacological DVT prophylaxis secondary to recent issues with spontaneous epidural hematoma Vaccination status: Unknown  Foley catheter: Yes- placed 4/16 in ER-discontinued 5/5 but reinserted 5/8  HPI: 87 y.o. female with PMH significant for HTN, DM2, dementia and recent cervical surgery for hematoma who was residing at [facility] receiving rehabilitative therapies.  She was brought to the ER secondary to somnolence.Na wasfound to be extremely low and is being slowly corrected. Initially on 3% saline and subsequently was transitioned to short-term IV fluid with IV Lasix.  Currently stable on oral Lasix.  Patient has been having issues with intermittent recurrent low-grade fevers.  Urine culture positive for VRE.  RUE swelling with negative duplex study.  Subjective: Awake, no specific complaints.  Remains highly confused with significant short-term memory deficits.  Objective: Vitals:   08/02/20 2056 08/03/20 0445  BP: (!) 180/74 (!) 174/63  Pulse: 86 75   Resp: 19 17  Temp: 98 F (36.7 C) 98.6 F (37 C)  SpO2: 97% 100%    Intake/Output Summary (Last 24 hours) at 08/03/2020 0809 Last data filed at 08/03/2020 3846 Gross per 24 hour  Intake 260 ml  Output 300 ml  Net -40 ml   Filed Weights   07/22/20 0350 07/23/20 0357 07/24/20 0550  Weight: 59.9 kg 56.1 kg 55.6 kg    Exam:  Constitutional: Awake, calm, no acute distress Respiratory: Anterior lung sounds are clear to auscultation and she remained stable on room air without any increased work of breathing Cardiovascular: Sounds are S1-S2, pulse remains regular, no peripheral edema. Abdomen: LBM 5/9, soft and nontender nondistended with normoactive bowel Genitourinary: Foley catheter in place with bladder training in progress clamping alternating with straight drain every 4 hours Neurologic: CN 2-12 grossly intact. Sensation intact, Strength 4/5 on left.  RLE 4/5 with RUE 3/5. Psychiatric:, Pleasant, oriented to name only   Assessment/Plan: Acute problems: Severe hyponatremia (multifactorial) 2/2 potomania, SIADH and medications Was on chlorthalidone prior to admission -dc'd Initially received 3% saline per recommendation of nephrology Patient appears to be developing mild dehydration and despite Lasix sodium has decreased to 132 therefore will discontinue Lasix and scheduled potassium although will receive extra doses of potassium today given hypokalemia  VRE CAUTI Sensitive to ampicillin so started on IV ampicillin 5/9 for 7 days  Right upper extremity edema Venous duplex negative for DVT  Orthostatic hypotension Continue Flomax with Florinef and increase Florinef to twice daily Check orthostatic vital signs daily and with all PT/OT sessions Continue PT  OT  Recurrent urinary retention During previous admission patient had acute urinary retention that required placement of catheter.  Discharging physician documented need to begin bladder training but unclear if this was  attempted or accomplished at facility Foley DC'd on 5/5 but continued to have urinary retention so was replaced on 5/8-possibly acute UTI contribute 5/9 begin bladder training alternating between clamping Foley and allowing straight drain every 4 hours Continue Flomax  Iron deficiency anemia Given PRBCs on 4/23 and given Feraheme 4/27 She is heme positive but is not a candidate for EGD or colonoscopy per GI Hemoglobin stable at 9.5 On 5/10 hemoglobin 10.7, iron up to 82 ferritin 654 and likely slightly elevated due to acute UTI  Chronic dementia Continue Namenda Appreciate SLP evaluation.  Noted to have moderate to severe cognitive impairments secondary to dementia and is suspected to be at her baseline.  Scored a 1/30 on SLUMS.  Global cognitive deficits exhibited including reduced recall, reduced awareness, disorientation, decreased sequencing, poor executive function skills and reduced sustained attention receptive and expressive language skills as well as motor speech skills grossly intact.  Recommendation for SNF  Hypertension Thiazide diuretic discontinued Supine blood pressure elevated but recent issues with orthostatic hypotension See above regarding Florinef dosing  Hypokalemia/hypomagnesemia K+ 3.1> 2.8 > 3.7 > 3.3 Mg+ 1.6 > 1.5 > 1.8 Begin scheduled potassium as above-as of 5/10 potassium has decreased to 3.3 therefore will give an extra dose of potassium today Begin magnesium twice daily Follow labs intermittently   Diabetes mellitus 2 Diet controlled  Moderate protein calorie malnutrition Nutrition Problem: Moderate Malnutrition Etiology: chronic illness (dementia) Signs/Symptoms: moderate fat depletion,severe muscle depletion Interventions: Tube feeding-cortrack removed 5/1 no longer on tube feedings Variable intake eating 25 to 50% of meals  Physical deconditioning PT/OT continue to recommend SNF for rehabilitative therapies Continues to experience symptomatic  orthostasis when moving from supine to sitting and sitting to briefly standing Unfortunately second appeal to insurance company upheld denial of SNF for rehabilitative therapies.  She likely needs to transition to custodial care but will be unable to do so until Medicaid has been approved.  We will continue to work aggressively with her to see if she can improve enough to where daughter can take her home while waiting on Medicaid to be approved. Patient very confused but pleasant and cooperative with recent PT efforts.  Difficult to get her to initiate movements at times and requires total assist x2 to get out of bed and to maintain upright due to heavy posterior lean   Other problems: Recent cervical surgery with complication of hematoma For short-term placement at skilled nursing facility   Data Reviewed: Basic Metabolic Panel: Recent Labs  Lab 07/28/20 0024 07/29/20 0043 07/30/20 0233 08/01/20 0021 08/02/20 0011  NA 132* 132* 133*  --  134*  K 2.8* 3.7 3.8  --  3.5  CL 99 99 99  --  100  CO2 28 27 27   --  27  GLUCOSE 130* 144* 159*  --  149*  BUN 7* 8 9  --  8  CREATININE 0.36* 0.39* 0.42*  --  0.43*  CALCIUM 8.7* 8.6* 9.0  --  9.1  MG 1.5*  --   --  1.8  --    Liver Function Tests: Recent Labs  Lab 07/28/20 0024 07/29/20 0043  AST 21 19  ALT 29 25  ALKPHOS 68 68  BILITOT 0.5 0.3  PROT 4.8* 5.0*  ALBUMIN 2.3* 2.3*   No results for input(s):  LIPASE, AMYLASE in the last 168 hours. No results for input(s): AMMONIA in the last 168 hours. CBC: Recent Labs  Lab 07/28/20 0024 07/29/20 0043  WBC 5.6 5.2  NEUTROABS 3.3 2.9  HGB 9.6* 9.5*  HCT 30.8* 30.3*  MCV 86.3 85.8  PLT 273 284   CBG: No results for input(s): GLUCAP in the last 168 hours.     Studies: VAS [place] UPPER EXTREMITY VENOUS DUPLEX  Result Date: 08/02/2020 UPPER VENOUS STUDY  Patient Name:  ELVERDA WENDEL  Date of Exam:   08/02/2020 Medical Rec #: [id number]      Accession #:    [id number] Date of Birth:  [birth year]     Patient Gender: F Patient Age:   89Y Exam Location:  [facility] Procedure:      VAS [place] UPPER EXTREMITY VENOUS DUPLEX Referring Phys: [id number] Revonda Standard L Sibel Khurana --------------------------------------------------------------------------------  Indications: Swelling, and Pain Comparison Study: no prior Performing Technologist: Blanch Media RVS  Examination Guidelines: A complete evaluation includes B-mode imaging, spectral Doppler, color Doppler, and power Doppler as needed of all accessible portions of each vessel. Bilateral testing is considered an integral part of a complete examination. Limited examinations for reoccurring indications may be performed as noted.  Right Findings: +----------+------------+---------+-----------+----------+-------+ RIGHT     CompressiblePhasicitySpontaneousPropertiesSummary +----------+------------+---------+-----------+----------+-------+ IJV           Full       Yes       Yes                      +----------+------------+---------+-----------+----------+-------+ Subclavian    Full       Yes       Yes                      +----------+------------+---------+-----------+----------+-------+ Axillary      Full       Yes       Yes                      +----------+------------+---------+-----------+----------+-------+ Brachial      Full       Yes       Yes                      +----------+------------+---------+-----------+----------+-------+ Radial        Full                                          +----------+------------+---------+-----------+----------+-------+ Ulnar         Full                                          +----------+------------+---------+-----------+----------+-------+ Cephalic      Full                                          +----------+------------+---------+-----------+----------+-------+ Basilic       Full                                           +----------+------------+---------+-----------+----------+-------+  Left Findings: +----+------------+---------+-----------+----------+-------+ LEFTCompressiblePhasicitySpontaneousPropertiesSummary +----+------------+---------+-----------+----------+-------+ IJV     Full       Yes       Yes                      +----+------------+---------+-----------+----------+-------+  Summary:  Right: No evidence of deep vein thrombosis in the upper extremity. No evidence of superficial vein thrombosis in the upper extremity. No evidence of thrombosis in the subclavian.  *See table(s) above for measurements and observations.  Diagnosing physician: Lemar Livings MD Electronically signed by Lemar Livings MD on 08/02/2020 at 2:56:37 PM.    Final     Scheduled Meds: . chlorhexidine  15 mL Mouth Rinse BID  . Chlorhexidine Gluconate Cloth  6 each Topical Q0600  . fludrocortisone  0.1 mg Oral Daily  . furosemide  10 mg Oral Daily  . levothyroxine  25 mcg Oral Q0600  . magnesium oxide  400 mg Oral BID  . mouth rinse  15 mL Mouth Rinse q12n4p  . memantine  10 mg Oral Daily  . potassium chloride  20 mEq Oral Daily  . tamsulosin  0.4 mg Oral QPC supper   Continuous Infusions: . sodium chloride 50 mL/hr at 07/15/20 1036  . ampicillin (OMNIPEN) IV 1 g (08/03/20 0324)    Principal Problem:   Hyponatremia Active Problems:   Benign essential HTN   Type 2 diabetes mellitus without complication (HCC)   Malnutrition of moderate degree   Pressure injury of skin   Consultants:  Nephrology  PCCM  Procedures:  Cortrak tube which has been removed  Antibiotics: Anti-infectives (From admission, onward)   Start     Dose/Rate Route Frequency Ordered Stop   08/02/20 1530  ampicillin (OMNIPEN) 1 g in sodium chloride 0.9 % 100 mL IVPB        1 g 300 mL/hr over 20 Minutes Intravenous Every 6 hours 08/02/20 1023     08/02/20 1000  ampicillin-sulbactam (UNASYN) 1.5 g in sodium chloride 0.9 % 100 mL IVPB   Status:  Discontinued        1.5 g 200 mL/hr over 30 Minutes Intravenous Every 6 hours 08/02/20 0823 08/02/20 1022   07/13/20 2200  cefTRIAXone (ROCEPHIN) 1 g in sodium chloride 0.9 % 100 mL IVPB        1 g 200 mL/hr over 30 Minutes Intravenous Every 24 hours 07/13/20 2050 07/17/20 2151       Time spent: 20 minutes    Junious Silk ANP  [facility] 7 am - 330 pm/M-F for direct patient care and secure chat Please refer to Amion for contact info 21  days

## 2020-08-04 LAB — BASIC METABOLIC PANEL
Anion gap: 7 (ref 5–15)
BUN: 10 mg/dL (ref 8–23)
CO2: 27 mmol/L (ref 22–32)
Calcium: 9.1 mg/dL (ref 8.9–10.3)
Chloride: 101 mmol/L (ref 98–111)
Creatinine, Ser: 0.44 mg/dL (ref 0.44–1.00)
GFR, Estimated: >60 mL/min (ref >60)
Glucose, Bld: 174 mg/dL — ABNORMAL HIGH (ref 70–99)
Potassium: 3.6 mmol/L (ref 3.5–5.1)
Sodium: 135 mmol/L (ref 135–145)

## 2020-08-04 MED ORDER — METHOCARBAMOL 500 MG PO TABS
500.0000 mg | ORAL_TABLET | Freq: Two times a day (BID) | ORAL | Status: DC
  Administered 2020-08-04 – 2020-08-06 (×5): 500 mg via ORAL
  Filled 2020-08-04 (×5): qty 1

## 2020-08-04 NOTE — Progress Notes (Signed)
CSW spoke with Olegario Messier at [facility] who states the facility will accept the patient as private pay once she is medically stable for discharge.  Edwin Dada, MSW, LCSW Transitions of Care  Clinical Social Worker II [phone]

## 2020-08-04 NOTE — Plan of Care (Signed)

## 2020-08-04 NOTE — TOC Progression Note (Signed)
Transition of Care [facility]) - Progression Note    Patient Details  Name: Suzanne Fox MRN: [id number] Date of Birth: [birth year]  Transition of Care [facility]) CM/SW Contact  Janae Bridgeman, RN Phone Number: 08/04/2020, 10:44 AM  Clinical Narrative:    Case management spoke with patient's niece, Boone Master, on the phone regarding transitions of care.  The patient's niece states that she would rather the patient be transferred to [facility] and she is agreeable to spin down patient's bank account to pay for the facility until her Medicaid is approved.  The patient's niece has an appointment with [facility] this morning and she is aware that once the patient is medically stable for discharge - that she will be transferred to the nursing facility.  Merryl Hacker, MSW spoke with [facility], and [place], [place] has appointment with the family today.   Expected Discharge Plan: Skilled Nursing Facility Barriers to Discharge: Continued Medical Work up  Expected Discharge Plan and Services Expected Discharge Plan: Skilled Nursing Facility In-house Referral: Clinical Social Work Discharge Planning Services: CM Consult Post Acute Care Choice: Skilled Nursing Facility Living arrangements for the past 2 months: Single Family Home Expected Discharge Date: 07/26/20                                     Social Determinants of Health (SDOH) Interventions    Readmission Risk Interventions No flowsheet data found.

## 2020-08-04 NOTE — Plan of Care (Signed)
  Problem: Coping: Goal: Level of anxiety will decrease Outcome: Progressing   Problem: Pain Managment: Goal: General experience of comfort will improve Outcome: Progressing   Problem: Activity: Goal: Risk for activity intolerance will decrease Outcome: Not Progressing

## 2020-08-04 NOTE — Progress Notes (Addendum)
Occupational Therapy Treatment Patient Details Name: Suzanne Fox MRN: [id number] DOB: Jan 07, 1930 Today's Date: 08/04/2020    History of present illness 87 yo female presents to ED on 4/19 from [facility] with lethargy, AMS, hypotension, hyponatremia. Cortrak placed for nocturnal feeds. PMH significant for HTN, DM2, dementia, tobacco abuse, PAD, OA, recent PNA and UTI, and recent admission 3/17-3/25 with spontaneous epidural hematoma requiring C spine decompression and fusion.   OT comments  Pt progressing slowly towards acute OT goals. Session limited by onset of orthostatic symptoms after sitting EOB about 2 minutes. Returned to supine, worked on increasing HOB angle to work on tolerance for upright position. BPs assessed as follows: 151/60 (95) HOB 22*, 134/61 (87) HOB 41* at O minutes, 138/58 (88) HOB 41* at 3 minutes. D/c plan remains appropriate.   Follow Up Recommendations  SNF    Equipment Recommendations  Other (comment);Wheelchair (measurements OT);Wheelchair cushion (measurements OT) (defer to next venue)    Recommendations for Other Services      Precautions / Restrictions Precautions Precautions: Fall Precaution Comments: possible new L knee contracture; orthostatic Required Braces or Orthoses:  (abdominal binder has been ordered but not yet delivered) Restrictions Weight Bearing Restrictions: No       Mobility Bed Mobility Overal bed mobility: Needs Assistance Bed Mobility: Supine to Sit;Sit to Supine     Supine to sit: Max assist;+2 for physical assistance;HOB elevated Sit to supine: Max assist;+2 for physical assistance        Transfers                 General transfer comment: unable to attempt 2/2 onset of lightheaded/dizzy sitting EOB, h/o orthostatic hypotension    Balance Overall balance assessment: Needs assistance Sitting-balance support: Feet supported;Bilateral upper extremity supported Sitting balance-Leahy Scale:  Poor Sitting balance - Comments: mostly total A 2/2 posterior lean able to sit with less assist intermittently then fatigued/began feeling poorly                                   ADL either performed or assessed with clinical judgement   ADL Overall ADL's : Needs assistance/impaired                                       General ADL Comments: sat EOB about 2 minutes before c/o feeling poorly and appeared dizzy/lightheaded. Assisted pt back to supine. After a few minutes to recover worked on increasing HOB height to work on tolerance for upright position.     Vision       Perception     Praxis      Cognition Arousal/Alertness: Lethargic Behavior During Therapy: Anxious Overall Cognitive Status: History of cognitive impairments - at baseline                                 General Comments: h/o dementia. easily startled. Often responds with "I don't know." Lethargic throughout session more alert EOB but quickly returned to sleeping once back in supine.        Exercises     Shoulder Instructions       General Comments BKI removed for OT session, placed back on at end of session    Pertinent Vitals/ Pain  Pain Assessment: Faces Faces Pain Scale: Hurts whole lot Pain Location: L knee when trying to straighten it actively and passively Pain Descriptors / Indicators: Grimacing;Guarding;Moaning Pain Intervention(s): Monitored during session;Repositioned;Limited activity within patient's tolerance  Home Living                                          Prior Functioning/Environment              Frequency  Min 2X/week        Progress Toward Goals  OT Goals(current goals can now be found in the care plan section)  Progress towards OT goals: Progressing toward goals (slowly)  Acute Rehab OT Goals Patient Stated Goal: none stated OT Goal Formulation: Patient unable to participate in goal  setting Time For Goal Achievement: 08/12/20 Potential to Achieve Goals: Good ADL Goals Pt Will Perform Eating: with set-up;sitting Pt Will Perform Grooming: with set-up;sitting Additional ADL Goal #1: patient will be Min A for bed mobility- sit/supine- to increase independence with toileting. Additional ADL Goal #2: Patient will sit edge of bed unsupported for up to 5 min with supervision only to increase independence with ADL.  Plan Discharge plan remains appropriate    Co-evaluation                 AM-PAC OT "6 Clicks" Daily Activity     Outcome Measure   Help from another person eating meals?: A Lot Help from another person taking care of personal grooming?: A Lot Help from another person toileting, which includes using toliet, bedpan, or urinal?: Total Help from another person bathing (including washing, rinsing, drying)?: A Lot Help from another person to put on and taking off regular upper body clothing?: A Lot Help from another person to put on and taking off regular lower body clothing?: Total 6 Click Score: 10    End of Session    OT Visit Diagnosis: Unsteadiness on feet (R26.81);Muscle weakness (generalized) (M62.81);Other symptoms and signs involving cognitive function;Feeding difficulties (R63.3);Pain   Activity Tolerance Other (comment) (orthostatic symptoms EOB)   Patient Left in bed;with call bell/phone within reach;with bed alarm set   Nurse Communication          Time: [phone] OT Time Calculation (min): 36 min  Charges: OT General Charges $OT Visit: 1 Visit OT Treatments $Self Care/Home Management : 8-22 mins $Therapeutic Activity: 8-22 mins   Raynald Kemp, OT Acute Rehabilitation Services Pager: [phone] Office: [phone]   Pilar Grammes 08/04/2020, 2:56 PM

## 2020-08-04 NOTE — Progress Notes (Addendum)
[facility] PROGRESS NOTE  Suzanne Fox YKZ:[id number] DOB: [birth year] DOA: 07/13/2020 PCP: Georgianne Fick, MD   This patient has 2 medical record numbers  Status: Remains inpatient appropriate because:Unsafe d/c plan   Dispo: The patient is from: SNF              Anticipated d/c is to: SNF--niece will pay out-of-pocket for initial days at [facility] care and has worked on an arrangement with them until [facility] active              Patient currently is not medically stable to d/c.  Currently being worked up for DVT, continues to have a high level of care in context of recurrent orthostatic hypotension, IV antibiotics for UTI and bladder training in hopes of eventual discontinuation of Foley catheter; also patient is max 2+ physical assistance   Difficult to place patient Yes-patient previously at skilled nursing facility for rehab.  Insurance has denied return to SNFx 2. Niece has also begun Medicaid application process.   Level of care: Med-Surg  Code Status: DNR Family Communication: Niece by telephone on 5/9; CM spoke with niece on 5/11 DVT prophylaxis: No pharmacological DVT prophylaxis secondary to recent issues with spontaneous epidural hematoma Vaccination status: Unknown  Foley catheter: Yes- placed 4/16 in ER-discontinued 5/5 but reinserted 5/8  HPI: 87 y.o. female with PMH significant for HTN, DM2, dementia and recent cervical surgery for hematoma who was residing at [facility] healthcare receiving rehabilitative therapies.  She was brought to the ER secondary to somnolence.Na wasfound to be extremely low and is being slowly corrected. Initially on 3% saline and subsequently was transitioned to short-term IV fluid with IV Lasix.  Currently stable on oral Lasix.  Patient has been having issues with intermittent recurrent low-grade fevers.  Urine culture positive for VRE.  RUE swelling with negative duplex study.  Subjective: Awake and pleasant.  Unable to  recall if she had had breakfast this morning.  Denies pain but family and nursing staff have reported Chane having some muscle spasms in lower extremities in context of evolving contractures.  Objective: Vitals:   08/03/20 2046 08/04/20 0552  BP: (!) 146/48 (!) 156/47  Pulse: 97 93  Resp: 18 18  Temp: 99.1 F (37.3 C) 98.2 F (36.8 C)  SpO2: 99% 98%    Intake/Output Summary (Last 24 hours) at 08/04/2020 0817 Last data filed at 08/03/2020 1810 Gross per 24 hour  Intake 640 ml  Output 150 ml  Net 490 ml   Filed Weights   07/22/20 0350 07/23/20 0357 07/24/20 0550  Weight: 59.9 kg 56.1 kg 55.6 kg    Exam:  Constitutional: Awake and pleasant, no acute distress Respiratory: Lungs are clear to auscultation, room air Cardiovascular: Normal heart sounds, regular pulse, normotensive while in bed Abdomen: LBM 5/10, soft nontender nondistended with normoactive bowel sounds.  Variable oral intake Genitourinary: Foley catheter in place yellow urine Neurologic: CN 2-12 grossly intact. Sensation intact, Strength 4/5 on left.  RLE 4/5 with RUE 3/5. Psychiatric: Awake, pleasant, oriented times name only   Assessment/Plan: Acute problems: Severe hyponatremia (multifactorial) 2/2 potomania, SIADH and medications Was on chlorthalidone prior to admission -dc'd Initially given 3% saline per rec nephrology Developed mild dehydration and despite Lasix sodium decreased to 132 therefore discontinued Lasix and scheduled potassium on 5/10  VRE CAUTI Sensitive to ampicillin so started on IV ampicillin 5/9 for 7 days  Right upper extremity edema Venous duplex negative for DVT  Orthostatic hypotension Continue Florinef BID Check  orthostatic vital signs daily and with all PT/OT sessions Continue PT OT   Recurrent urinary retention During previous admission patient had acute urinary retention that required placement of catheter.  Discharging physician documented need to begin bladder training  but unclear if this was attempted or accomplished at facility Foley DC'd on 5/5 but continued to have urinary retention so was replaced on 5/8-possibly acute UTI contribute 5/9 begin bladder training alternating between clamping Foley and allowing straight drain every 4 hours Continue Flomax Supine: 167/75 Sitting: 110/70 Standing: 140/73>>117/68  Iron deficiency anemia Given PRBCs on 4/23 and given Feraheme 4/27 Recently heme positive but not a candidate for EGD or colonoscopy per GI 5/10 hemoglobin 10.7, iron up to 82 ferritin 654   Chronic dementia Continue Namenda Appreciate SLP evaluation.Scored a 1/30 on SLUMS. Recommendation for SNF  Hypertension Thiazide diuretic discontinued Supine blood pressure elevated but recent issues with orthostatic hypotension See above regarding Florinef dosing  Hypokalemia/hypomagnesemia K+ 3.1> 2.8 > 3.7 > 3.3 > 3.6 Mg+ 1.6 > 1.5 > 1.8 Continue magnesium twice daily Follow labs intermittently   Diabetes mellitus 2 Diet controlled  Moderate protein calorie malnutrition Nutrition Problem: Moderate Malnutrition Etiology: chronic illness (dementia) Signs/Symptoms: moderate fat depletion,severe muscle depletion On a regular diet with variable intake eating 25 to 50% of meals  Physical deconditioning PT/OT continue to recommend SNF for rehabilitative therapies Orthostasis has improved with treatment including discontinuation of Lasix Developing lower extremity contractures so PROM every 4 hours ordered and lower extremity splints placed at recommendation of PT Patient having muscle spasms from evolving contractures so we will start low-dose Robaxin 500 mg BID   Other problems: Recent spontaneous cervical neck hematoma s/p evacuation Reason for for short-term placement at skilled nursing facility to current admission   Data Reviewed: Basic Metabolic Panel: Recent Labs  Lab 07/29/20 0043 07/30/20 0233 08/01/20 0021 08/02/20 0011  08/03/20 0836 08/04/20 0040  NA 132* 133*  --  134* 132* 135  K 3.7 3.8  --  3.5 3.3* 3.6  CL 99 99  --  100 99 101  CO2 27 27  --  27 27 27   GLUCOSE 144* 159*  --  149* 264* 174*  BUN 8 9  --  8 9 10   CREATININE 0.39* 0.42*  --  0.43* 0.40* 0.44  CALCIUM 8.6* 9.0  --  9.1 9.2 9.1  MG  --   --  1.8  --   --   --    Liver Function Tests: Recent Labs  Lab 07/29/20 0043  AST 19  ALT 25  ALKPHOS 68  BILITOT 0.3  PROT 5.0*  ALBUMIN 2.3*   No results for input(s): LIPASE, AMYLASE in the last 168 hours. No results for input(s): AMMONIA in the last 168 hours. CBC: Recent Labs  Lab 07/29/20 0043 08/03/20 0836  WBC 5.2 7.4  NEUTROABS 2.9 5.9  HGB 9.5* 10.7*  HCT 30.3* 34.4*  MCV 85.8 85.8  PLT 284 291   CBG: No results for input(s): GLUCAP in the last 168 hours.     Studies: VAS 09/28/20 UPPER EXTREMITY VENOUS DUPLEX  Result Date: 08/02/2020 UPPER VENOUS STUDY  Patient Name:  LAN ENTSMINGER  Date of Exam:   08/02/2020 Medical Rec #: [name]      Accession #:    10/02/[id number] Date of Birth: 1930/01/02     Patient Gender: F Patient Age:   89Y Exam Location:  [facility] Procedure:      VAS 12-13-2003 UPPER  EXTREMITY VENOUS DUPLEX Referring Phys: [id number] Yuko Coventry L Paschal Blanton --------------------------------------------------------------------------------  Indications: Swelling, and Pain Comparison Study: no prior Performing Technologist: Blanch Media RVS  Examination Guidelines: A complete evaluation includes B-mode imaging, spectral Doppler, color Doppler, and power Doppler as needed of all accessible portions of each vessel. Bilateral testing is considered an integral part of a complete examination. Limited examinations for reoccurring indications may be performed as noted.  Right Findings: +----------+------------+---------+-----------+----------+-------+ RIGHT     CompressiblePhasicitySpontaneousPropertiesSummary +----------+------------+---------+-----------+----------+-------+ IJV            Full       Yes       Yes                      +----------+------------+---------+-----------+----------+-------+ Subclavian    Full       Yes       Yes                      +----------+------------+---------+-----------+----------+-------+ Axillary      Full       Yes       Yes                      +----------+------------+---------+-----------+----------+-------+ Brachial      Full       Yes       Yes                      +----------+------------+---------+-----------+----------+-------+ Radial        Full                                          +----------+------------+---------+-----------+----------+-------+ Ulnar         Full                                          +----------+------------+---------+-----------+----------+-------+ Cephalic      Full                                          +----------+------------+---------+-----------+----------+-------+ Basilic       Full                                          +----------+------------+---------+-----------+----------+-------+  Left Findings: +----+------------+---------+-----------+----------+-------+ LEFTCompressiblePhasicitySpontaneousPropertiesSummary +----+------------+---------+-----------+----------+-------+ IJV     Full       Yes       Yes                      +----+------------+---------+-----------+----------+-------+  Summary:  Right: No evidence of deep vein thrombosis in the upper extremity. No evidence of superficial vein thrombosis in the upper extremity. No evidence of thrombosis in the subclavian.  *See table(s) above for measurements and observations.  Diagnosing physician: Lemar Livings MD Electronically signed by Lemar Livings MD on 08/02/2020 at 2:56:37 PM.    Final     Scheduled Meds: . chlorhexidine  15 mL Mouth Rinse BID  . Chlorhexidine Gluconate Cloth  6 each Topical Q0600  . diclofenac Sodium  2 g Topical QID  .  fludrocortisone  0.1 mg Oral BID  .  levothyroxine  25 mcg Oral Q0600  . magnesium oxide  400 mg Oral BID  . mouth rinse  15 mL Mouth Rinse q12n4p  . memantine  10 mg Oral Daily  . tamsulosin  0.4 mg Oral QPC supper   Continuous Infusions: . sodium chloride 50 mL/hr at 07/15/20 1036  . ampicillin (OMNIPEN) IV Stopped (08/04/20 0528)    Principal Problem:   Hyponatremia Active Problems:   Benign essential HTN   Type 2 diabetes mellitus without complication (HCC)   Malnutrition of moderate degree   Pressure injury of skin   Consultants:  Nephrology  PCCM  Procedures:  Cortrak tube which has been removed  Antibiotics: Anti-infectives (From admission, onward)   Start     Dose/Rate Route Frequency Ordered Stop   08/02/20 1530  ampicillin (OMNIPEN) 1 g in sodium chloride 0.9 % 100 mL IVPB        1 g 300 mL/hr over 20 Minutes Intravenous Every 6 hours 08/02/20 1023 08/08/20 2359   08/02/20 1000  ampicillin-sulbactam (UNASYN) 1.5 g in sodium chloride 0.9 % 100 mL IVPB  Status:  Discontinued        1.5 g 200 mL/hr over 30 Minutes Intravenous Every 6 hours 08/02/20 0823 08/02/20 1022   07/13/20 2200  cefTRIAXone (ROCEPHIN) 1 g in sodium chloride 0.9 % 100 mL IVPB        1 g 200 mL/hr over 30 Minutes Intravenous Every 24 hours 07/13/20 2050 07/17/20 2151       Time spent: 20 minutes    Junious SilkAllison Jenilyn Magana ANP  [facility] 7 am - 330 pm/M-F for direct patient care and secure chat Please refer to Amion for contact info 22  days

## 2020-08-05 MED ORDER — ENSURE ENLIVE PO LIQD
237.0000 mL | Freq: Two times a day (BID) | ORAL | Status: DC
  Administered 2020-08-05 – 2020-08-06 (×4): 237 mL via ORAL

## 2020-08-05 MED ORDER — ADULT MULTIVITAMIN W/MINERALS CH
1.0000 | ORAL_TABLET | Freq: Every day | ORAL | Status: DC
  Administered 2020-08-05 – 2020-08-06 (×2): 1 via ORAL
  Filled 2020-08-05 (×2): qty 1

## 2020-08-05 NOTE — Progress Notes (Signed)
Nutrition Follow-up  DOCUMENTATION CODES:   Non-severe (moderate) malnutrition in context of chronic illness  INTERVENTION:   -Ensure Enlive po BID, each supplement provides 350 kcal and 20 grams of protein -MVI with minerals daily -Continue Magic cup TID with meals, each supplement provides 290 kcal and 9 grams of protein  NUTRITION DIAGNOSIS:   Moderate Malnutrition related to chronic illness (dementia) as evidenced by moderate fat depletion,severe muscle depletion.  Ongoing  GOAL:   Patient will meet greater than or equal to 90% of their needs  Progressing   MONITOR:   PO intake,Supplement acceptance,Labs,Weight trends,Skin,I & O's  REASON FOR ASSESSMENT:   Consult Enteral/tube feeding initiation and management  ASSESSMENT:   87 year old female who presented to the ED on 4/19 from [facility] with AMS and hypotension. PMH of dementia, HTN, T2DM, and recent cervical surgery for hematoma. Pt admitted with hyponatremia in the setting of suspected dehydration/poor nutrition and use of chlorthalidone as well as acute metabolic encephalopathy.  4/20-Cortrak placed, tip at the pylorus per abdominal x-ray 4/29- calorie count completed- meeting 64% of estimated energy needs 5/1- cortrak removed  Reviewed I/O's: +290 ml x 24 hours and -6.4 L since 07/22/20  UOP: 350 ml x 24 hours  Pt with fair appetite. Noted meal completions 25-50%. Pt and family do not desire artifical means of nutrition/ hydration.   Medications reviewed and include magnesium oxide.   Per TOC notes, plan to d/c to SNF once medically stable.   Labs reviewed.   Diet Order:   Diet Order            Diet - low sodium heart healthy           DIET - DYS 1 Room service appropriate? Yes; Fluid consistency: Thin  Diet effective now                 EDUCATION NEEDS:   Not appropriate for education at this time  Skin:  Skin Assessment: Skin Integrity Issues: Skin Integrity Issues::  Stage II Stage II: coccyx (healing)  Last BM:  08/04/20  Height:   Ht Readings from Last 1 Encounters:  07/13/20 5\' 5"  (1.651 m)    Weight:   Wt Readings from Last 1 Encounters:  07/24/20 55.6 kg    Ideal Body Weight:  56.8 kg  BMI:  Body mass index is 20.4 kg/m.  Estimated Nutritional Needs:   Kcal:  1550-1750 kcal  Protein:  80-95 grams  Fluid:  >/= 1.5 L/day    07/26/20, RD, LDN, CDCES Registered Dietitian II Certified Diabetes Care and Education Specialist Please refer to [facility] for RD and/or RD on-call/weekend/after hours pager

## 2020-08-05 NOTE — Progress Notes (Signed)
[facility] PROGRESS NOTE  Suzanne Fox EXN:[id number] DOB: [birth year] DOA: 07/13/2020 PCP: Georgianne Fick, MD   This patient has 2 medical record numbers  Status: Remains inpatient appropriate because:Unsafe d/c plan   Dispo: The patient is from: SNF              Anticipated d/c is to: SNF--5/16 --niece will pay out-of-pocket for initial days at [facility] care and has worked on an arrangement with them until [facility] active              Patient currently is not medically stable to d/c.  Currently being worked up for DVT, continues to have a high level of care in context of recurrent orthostatic hypotension, IV antibiotics for UTI and bladder training in hopes of eventual discontinuation of Foley catheter; also patient is max 2+ physical assistance   Difficult to place patient Yes-patient previously at skilled nursing facility for rehab.  Insurance has denied return to SNFx 2. Niece has also begun Medicaid application process.   Level of care: Med-Surg  Code Status: DNR Family Communication: Niece by telephone on 5/9; CM spoke with niece on 5/11 DVT prophylaxis: No pharmacological DVT prophylaxis secondary to recent issues with spontaneous epidural hematoma Vaccination status: Unknown  Foley catheter: Yes- placed 4/16 in ER-discontinued 5/5 but reinserted 5/8  HPI: 87 y.o. female with PMH significant for HTN, DM2, dementia and recent cervical surgery for hematoma who was residing at [facility] healthcare receiving rehabilitative therapies.  She was brought to the ER secondary to somnolence.Na wasfound to be extremely low and is being slowly corrected. Initially on 3% saline and subsequently was transitioned to short-term IV fluid with IV Lasix.  Currently stable on oral Lasix.  Patient has been having issues with intermittent recurrent low-grade fevers.  Urine culture positive for VRE.  RUE swelling with negative duplex study.  Subjective: Awake and pleasant.  Not  drowsy.  No specific complaints verbalized.  Objective: Vitals:   08/04/20 2047 08/05/20 0506  BP: 110/70 (!) 158/65  Pulse: 91 86  Resp: 18 18  Temp: 98.5 F (36.9 C) 98.1 F (36.7 C)  SpO2: 95% 100%    Intake/Output Summary (Last 24 hours) at 08/05/2020 0855 Last data filed at 08/05/2020 0809 Gross per 24 hour  Intake 525.02 ml  Output 150 ml  Net 375.02 ml   Filed Weights   07/22/20 0350 07/23/20 0357 07/24/20 0550  Weight: 59.9 kg 56.1 kg 55.6 kg    Exam:  Constitutional: Alert, calm, no acute distress. Respiratory: Anterior lung sounds clear.  No increased work of breathing.  No cough.  Room air. Cardiovascular: Normal heart sounds, skin warm and dry with adequate capillary refill.  Pulse regular Abdomen: LBM 5/11, soft nontender with normoactive bowel sounds.  Variable oral intake. Genitourinary: Foley catheter in place yellow urine Neurologic: CN 2-12 grossly intact. Sensation intact, Strength 4/5 on left.  RLE 4/5 with RUE 3/5. Psychiatric: Awake and oriented times name only.   Assessment/Plan: Acute problems: Severe hyponatremia (multifactorial) 2/2 potomania, SIADH and medications Was on chlorthalidone prior to admission -dc'd Initially given 3% saline per rec nephrology Developed mild dehydration and despite Lasix sodium decreased to 132 therefore discontinued Lasix and scheduled potassium on 5/10  VRE CAUTI Sensitive to ampicillin so started on IV ampicillin 5/9 for 7 days  Right upper extremity edema Venous duplex negative for DVT  Orthostatic hypotension Continue Florinef BID Check orthostatic vital signs daily and with all PT/OT sessions Continue PT OT  Recurrent urinary retention During previous admission patient had acute urinary retention that required placement of catheter.  Discharging physician documented need to begin bladder training but unclear if this was attempted or accomplished at facility Foley DC'd on 5/5 but continued to have  urinary retention so was replaced on 5/8-possibly acute UTI contribute 5/12 continue bladder training alternating between clamping Foley and allowing straight drain every 8 hours Hopeful can remove Foley catheter on Saturday and check serial bladder scans determine if Foley needs to be replaced.  Continue Flomax   Iron deficiency anemia Given PRBCs on 4/23 and given Feraheme 4/27 Recently heme positive but not a candidate for EGD or colonoscopy per GI 5/10 hemoglobin 10.7, iron up to 82 ferritin 654   Chronic dementia Continue Namenda Appreciate SLP evaluation.Scored a 1/30 on SLUMS. Recommendation for SNF  Hypertension Thiazide diuretic discontinued Supine blood pressure elevated but recent issues with orthostatic hypotension See above regarding Florinef dosing  Hypokalemia/hypomagnesemia K+ 3.1> 2.8 > 3.7 > 3.3 > 3.6 Mg+ 1.6 > 1.5 > 1.8 Continue magnesium twice daily Follow labs intermittently   Diabetes mellitus 2 Diet controlled  Moderate protein calorie malnutrition Nutrition Problem: Moderate Malnutrition Etiology: chronic illness (dementia) Signs/Symptoms: moderate fat depletion,severe muscle depletion On a regular diet with variable intake eating 25 to 50% of meals  Physical deconditioning PT/OT continue to recommend SNF for rehabilitative therapies Orthostasis has improved with treatment including discontinuation of Lasix Developing lower extremity contractures so PROM every 4 hours ordered and lower extremity splints placed at recommendation of PT Patient having muscle spasms from evolving contractures so we will start low-dose Robaxin 500 mg BID   Other problems: Recent spontaneous cervical neck hematoma s/p evacuation Reason for for short-term placement at skilled nursing facility to current admission   Data Reviewed: Basic Metabolic Panel: Recent Labs  Lab 07/30/20 0233 08/01/20 0021 08/02/20 0011 08/03/20 0836 08/04/20 0040  NA 133*  --  134* 132*  135  K 3.8  --  3.5 3.3* 3.6  CL 99  --  100 99 101  CO2 27  --  27 27 27   GLUCOSE 159*  --  149* 264* 174*  BUN 9  --  8 9 10   CREATININE 0.42*  --  0.43* 0.40* 0.44  CALCIUM 9.0  --  9.1 9.2 9.1  MG  --  1.8  --   --   --    Liver Function Tests: No results for input(s): AST, ALT, ALKPHOS, BILITOT, PROT, ALBUMIN in the last 168 hours. No results for input(s): LIPASE, AMYLASE in the last 168 hours. No results for input(s): AMMONIA in the last 168 hours. CBC: Recent Labs  Lab 08/03/20 0836  WBC 7.4  NEUTROABS 5.9  HGB 10.7*  HCT 34.4*  MCV 85.8  PLT 291   CBG: No results for input(s): GLUCAP in the last 168 hours.     Studies: No results found.  Scheduled Meds: . chlorhexidine  15 mL Mouth Rinse BID  . Chlorhexidine Gluconate Cloth  6 each Topical Q0600  . diclofenac Sodium  2 g Topical QID  . feeding supplement  237 mL Oral BID BM  . fludrocortisone  0.1 mg Oral BID  . levothyroxine  25 mcg Oral Q0600  . magnesium oxide  400 mg Oral BID  . mouth rinse  15 mL Mouth Rinse q12n4p  . memantine  10 mg Oral Daily  . methocarbamol  500 mg Oral BID  . multivitamin with minerals  1 tablet Oral Daily  .  tamsulosin  0.4 mg Oral QPC supper   Continuous Infusions: . sodium chloride 50 mL/hr at 07/15/20 1036  . ampicillin (OMNIPEN) IV 1 g (08/05/20 0445)    Principal Problem:   Hyponatremia Active Problems:   Benign essential HTN   Type 2 diabetes mellitus without complication (HCC)   Malnutrition of moderate degree   Pressure injury of skin   Consultants:  Nephrology  PCCM  Procedures:  Cortrak tube which has been removed  Antibiotics: Anti-infectives (From admission, onward)   Start     Dose/Rate Route Frequency Ordered Stop   08/02/20 1530  ampicillin (OMNIPEN) 1 g in sodium chloride 0.9 % 100 mL IVPB        1 g 300 mL/hr over 20 Minutes Intravenous Every 6 hours 08/02/20 1023 08/08/20 2359   08/02/20 1000  ampicillin-sulbactam (UNASYN) 1.5 g in  sodium chloride 0.9 % 100 mL IVPB  Status:  Discontinued        1.5 g 200 mL/hr over 30 Minutes Intravenous Every 6 hours 08/02/20 0823 08/02/20 1022   07/13/20 2200  cefTRIAXone (ROCEPHIN) 1 g in sodium chloride 0.9 % 100 mL IVPB        1 g 200 mL/hr over 30 Minutes Intravenous Every 24 hours 07/13/20 2050 07/17/20 2151       Time spent: 20 minutes    Junious Silk ANP  [facility] 7 am - 330 pm/M-F for direct patient care and secure chat Please refer to Amion for contact info 23  days

## 2020-08-05 NOTE — Care Management Important Message (Signed)
Important Message  Patient Details  Name: Suzanne Fox MRN: [id number] Date of Birth: Aug 16, 1929   Medicare Important Message Given:  Yes     Niesha Bame P Lorenzo Pereyra 08/05/2020, 2:22 PM

## 2020-08-06 DIAGNOSIS — I10 Essential (primary) hypertension: Secondary | ICD-10-CM

## 2020-08-06 LAB — RESP PANEL BY RT-PCR (FLU A&B, COVID) ARPGX2
Influenza A by PCR: NEGATIVE
Influenza B by PCR: NEGATIVE
SARS Coronavirus 2 by RT PCR: NEGATIVE

## 2020-08-06 MED ORDER — TAMSULOSIN HCL 0.4 MG PO CAPS
0.4000 mg | ORAL_CAPSULE | Freq: Every day | ORAL | Status: DC
Start: 2020-08-06 — End: 2020-09-23

## 2020-08-06 MED ORDER — MIDODRINE HCL 2.5 MG PO TABS: 2.5000 mg | ORAL_TABLET | Freq: Two times a day (BID) | ORAL | Status: DC

## 2020-08-06 MED ORDER — DICLOFENAC SODIUM 1 % EX GEL: 2.0000 g | Freq: Four times a day (QID) | CUTANEOUS | Status: DC

## 2020-08-06 MED ORDER — AMOXICILLIN 500 MG PO CAPS: 500.0000 mg | ORAL_CAPSULE | Freq: Three times a day (TID) | ORAL | 0 refills | Status: AC

## 2020-08-06 MED ORDER — ENSURE ENLIVE PO LIQD: 237.0000 mL | Freq: Two times a day (BID) | ORAL | 12 refills | Status: DC

## 2020-08-06 NOTE — Progress Notes (Addendum)
Patient will go to room 123 at [facility]. Patient will be transported via [facility] - [facility] has been scheduled for pick up at 2pm. The number to call for report is [phone].  Patient's niece and Leavy Cella, RN made aware of discharge plan.  Edwin Dada, MSW, LCSW Transitions of Care  Clinical Social Worker II [phone]

## 2020-08-06 NOTE — Progress Notes (Signed)
PT Cancellation Note  Patient Details Name: Suzanne Fox MRN: [id number] DOB: 01/28/1930   Cancelled Treatment:    Reason Eval/Treat Not Completed: Other (comment) per chart, pt DCing to SNF this afternoon. Holding per dept protocol. Thank you for the opportunity to participate in her care!    Madelaine Etienne, DPT, PN1   Supplemental Physical Therapist [facility]    Pager [phone] Acute Rehab Office [phone]

## 2020-08-06 NOTE — Discharge Summary (Signed)
PATIENT DETAILS Name: Suzanne Fox Age: 87 y.o. Sex: female Date of Birth: [birth year] MRN: [id number]. Admitting Physician: Pieter Partridge, MD [id number], Campbell Lerner, MD  Admit Date: 07/13/2020 Discharge date: 08/06/2020  Recommendations for Outpatient Follow-up:  1. Follow up with PCP in 1-2 weeks 2. Please obtain CMP/CBC in one week 3. Please ensure urology follow-up before outpatient voiding trial.    Admitted From:  SNF  Disposition: SNF   Home Health: No  Equipment/Devices: None  Discharge Condition: Stable  CODE STATUS: DNR  Diet recommendation:  Diet Order            Diet - low sodium heart healthy           Diet Carb Modified           DIET - DYS 1 Room service appropriate? Yes; Fluid consistency: Thin  Diet effective now                  Brief Summary: See H&P, Labs, Consult and Test reports for all details in brief, patient is a 87 year old female with history of HTN, DM-2, dementia, recent C-spine surgery-presented with somnolence-found to have severe hyponatremia.  Brief Hospital Course: Severe hyponatremia: Felt to be multifactorial-significantly improved-initially treated with 3% NaCl-currently stable.  Please repeat chemistry panel in 1 week to ensure stability.  Avoid use of thiazide diuretics in the future.  VRE UTI: Likely related to indwelling urinary catheter-treated with IV ampicillin-we will switch to amoxicillin on discharge.  Right upper extremity edema: Doppler negative for DVT.  Stable for monitoring.  DM-2: Diet controlled-allow mild permissive hyperglycemia.  Not on any diabetic agents.  Orthostatic hypotension: Stable on Florinef-however we will switch to low-dose midodrine on discharge.  Allow some amount of hypertension-however if BP starts becoming uncontrolled-May need to add back amlodipine.  Defer to attending MD at SNF.  History of hypertension: No longer on antihypertensives-given  orthostasis.  Hypothyroidism: Continue low-dose levothyroxine-suggest repeat TSH in the next few months-May need to discontinue at some point.  Defer to attending MD at SNF.  Iron deficiency anemia: Hemoglobin stable-Per prior notes-patient not a candidate for EGD/colonoscopy per gastroenterology.  Continue iron supplementation-and to follow CBC.  Recurrent acute urinary retention: Voiding trial successful on 5/5-but patient redevelop urinary retention on 5/8.  Foley catheter reinserted-patient had issues with urinary retention during her last hospitalization as well.  She needs outpatient follow-up with urology for further voiding trials.  Being discharged with indwelling Foley catheter.  Dementia: Pleasantly confused.  Continue supportive care.  Maintain delirium precautions.  Recent cervical neck hematoma-s/p evacuation  Failure to thrive syndrome: Chronically ill-appearing-frail-suggest at some point obtaining palliative care follow-up while at SNF.  Moderate protein calorie malnutrition: Continue supplementation  RN pressure injury documentation: Pressure Injury 07/29/20 Coccyx Stage 2 -  Partial thickness loss of dermis presenting as a shallow open injury with a red, pink wound bed without slough. Healing ST 2 (Active)  07/29/20 1120  Location: Coccyx  Location Orientation:   Staging: Stage 2 -  Partial thickness loss of dermis presenting as a shallow open injury with a red, pink wound bed without slough.  Wound Description (Comments): Healing ST 2  Present on Admission: Yes    Procedures/ None  Discharge Diagnoses:  Principal Problem:   Hyponatremia Active Problems:   Benign essential HTN   Type 2 diabetes mellitus without complication (HCC)   Malnutrition of moderate degree   Pressure injury of skin   Discharge Instructions:  Activity:  As  tolerated with Full fall precautions use walker/cane & assistance as needed   Discharge Instructions    Call MD for:   persistant nausea and vomiting   Complete by: As directed    Call MD for:  severe uncontrolled pain   Complete by: As directed    Diet - low sodium heart healthy   Complete by: As directed    Diet Carb Modified   Complete by: As directed    Increase activity slowly   Complete by: As directed    Increase activity slowly   Complete by: As directed    No dressing needed   Complete by: As directed      Allergies as of 08/06/2020      Reactions   Aricept [donepezil Hcl]    Per MAR   Sulfa Antibiotics    Per MAR       Medication List    STOP taking these medications   amLODipine 10 MG tablet Commonly known as: NORVASC   aspirin EC 81 MG tablet   atenolol-chlorthalidone 50-25 MG tablet Commonly known as: TENORETIC   atorvastatin 40 MG tablet Commonly known as: LIPITOR   cefTRIAXone 1 g injection Commonly known as: ROCEPHIN   cloNIDine 0.1 MG tablet Commonly known as: CATAPRES   insulin glargine 100 UNIT/ML injection Commonly known as: LANTUS   levofloxacin 750 MG tablet Commonly known as: LEVAQUIN   lisinopril 40 MG tablet Commonly known as: ZESTRIL   methocarbamol 500 MG tablet Commonly known as: ROBAXIN   NovoLOG FlexPen 100 UNIT/ML FlexPen Generic drug: insulin aspart   sodium chloride 1 g tablet     TAKE these medications   acetaminophen 325 MG tablet Commonly known as: TYLENOL Take 650 mg by mouth every 4 (four) hours as needed for mild pain.   amoxicillin 500 MG capsule Commonly known as: AMOXIL Take 1 capsule (500 mg total) by mouth 3 (three) times daily for 2 days.   diclofenac Sodium 1 % Gel Commonly known as: VOLTAREN Apply 2 g topically 4 (four) times daily.   feeding supplement Liqd Take 237 mLs by mouth 2 (two) times daily between meals.   Gerhardt's butt cream Crea Apply 1 application topically daily.   levothyroxine 25 MCG tablet Commonly known as: SYNTHROID Take 25 mcg by mouth daily before breakfast.   memantine 10 MG  tablet Commonly known as: NAMENDA Take 10 mg by mouth daily.   midodrine 2.5 MG tablet Commonly known as: PROAMATINE Take 1 tablet (2.5 mg total) by mouth 2 (two) times daily with a meal.   tamsulosin 0.4 MG Caps capsule Commonly known as: FLOMAX Take 1 capsule (0.4 mg total) by mouth daily after supper.            Discharge Care Instructions  (From admission, onward)         Start     Ordered   08/06/20 0000  No dressing needed        08/06/20 1057          Contact information for follow-up providers    Georgianne Fick, MD. Schedule an appointment as soon as possible for a visit in 1 week(s).   Specialty: Internal Medicine Contact information: [address] [place] [address] [phone]            Contact information for after-discharge care    Destination    [facility] Preferred SNF .   Service: Skilled Nursing Contact information: [address] [place]  [place] [address] [phone]                 Allergies  Allergen Reactions  . Aricept [Donepezil Hcl]     Per MAR  . Sulfa Antibiotics     Per [facility]       Consultations: Nephrology, ICU  Other Procedures/Studies: DG CHEST PORT 1 VIEW  Result Date: 07/13/2020 CLINICAL DATA:  Weakness. EXAM: PORTABLE CHEST 1 VIEW COMPARISON:  None. FINDINGS: 2118 hours. Bibasilar atelectasis/infiltrate noted with small bilateral pleural effusions. The cardiopericardial silhouette is within normal limits for size. The visualized bony structures of the thorax show no acute abnormality. Degenerative changes noted right shoulder. Telemetry leads overlie the chest. IMPRESSION: Bibasilar atelectasis/infiltrate with small bilateral pleural effusions. Electronically Signed   By: Kennith Center M.D.   On: 07/13/2020 21:30   DG Abd Portable 1V  Result Date: 07/14/2020 CLINICAL DATA:  Feeding tube placement EXAM: PORTABLE ABDOMEN - 1 VIEW COMPARISON:  None. FINDINGS:  Feeding tube with tip at the level of the pylorus. The bowel gas pattern is normal. Hazy bilateral lower chest opacification with preceding dedicated chest radiograph. Severe lumbar spine degeneration. IMPRESSION: Feeding tube with tip at the pylorus. Electronically Signed   By: Marnee Spring M.D.   On: 07/14/2020 10:14   VAS [place] UPPER EXTREMITY VENOUS DUPLEX  Result Date: 08/02/2020 UPPER VENOUS STUDY  Patient Name:  CAILEIGH CANCHE  Date of Exam:   08/02/2020 Medical Rec #: [id number]      Accession #:    [id number] Date of Birth: 10-30-1929     Patient Gender: F Patient Age:   89Y Exam Location:  [facility] Procedure:      VAS [place] UPPER EXTREMITY VENOUS DUPLEX Referring Phys: [id number] Revonda Standard L ELLIS --------------------------------------------------------------------------------  Indications: Swelling, and Pain Comparison Study: no prior Performing Technologist: Blanch Media RVS  Examination Guidelines: A complete evaluation includes B-mode imaging, spectral Doppler, color Doppler, and power Doppler as needed of all accessible portions of each vessel. Bilateral testing is considered an integral part of a complete examination. Limited examinations for reoccurring indications may be performed as noted.  Right Findings: +----------+------------+---------+-----------+----------+-------+ RIGHT     CompressiblePhasicitySpontaneousPropertiesSummary +----------+------------+---------+-----------+----------+-------+ IJV           Full       Yes       Yes                      +----------+------------+---------+-----------+----------+-------+ Subclavian    Full       Yes       Yes                      +----------+------------+---------+-----------+----------+-------+ Axillary      Full       Yes       Yes                      +----------+------------+---------+-----------+----------+-------+ Brachial      Full       Yes       Yes                       +----------+------------+---------+-----------+----------+-------+ Radial        Full                                          +----------+------------+---------+-----------+----------+-------+  Ulnar         Full                                          +----------+------------+---------+-----------+----------+-------+ Cephalic      Full                                          +----------+------------+---------+-----------+----------+-------+ Basilic       Full                                          +----------+------------+---------+-----------+----------+-------+  Left Findings: +----+------------+---------+-----------+----------+-------+ LEFTCompressiblePhasicitySpontaneousPropertiesSummary +----+------------+---------+-----------+----------+-------+ IJV     Full       Yes       Yes                      +----+------------+---------+-----------+----------+-------+  Summary:  Right: No evidence of deep vein thrombosis in the upper extremity. No evidence of superficial vein thrombosis in the upper extremity. No evidence of thrombosis in the subclavian.  *See table(s) above for measurements and observations.  Diagnosing physician: Lemar LivingsBrandon Cain MD Electronically signed by Lemar LivingsBrandon Cain MD on 08/02/2020 at 2:56:37 PM.    Final    VAS US UPPER EXTREMITY VENOUS DUPLEX  Result Date: 07/14/2020 UPPER VENOUS STUDY  Indications: Edema Risk Factors: None identified. Limitations: Poor ultrasound/tissue interface and patient positioning. Comparison Study: No prior studies. Performing Technologist: Chanda BusingGregory Collins RVT  Examination Guidelines: A complete evaluation includes B-mode imaging, spectral Doppler, color Doppler, and power Doppler as needed of all accessible portions of each vessel. Bilateral testing is considered an integral part of a complete examination. Limited examinations for reoccurring indications may be performed as noted.  Right Findings:  +----------+------------+---------+-----------+----------+-------+ RIGHT     CompressiblePhasicitySpontaneousPropertiesSummary +----------+------------+---------+-----------+----------+-------+ IJV           Full       Yes       Yes                      +----------+------------+---------+-----------+----------+-------+ Subclavian    Full       Yes       Yes                      +----------+------------+---------+-----------+----------+-------+ Axillary      Full       Yes       Yes                      +----------+------------+---------+-----------+----------+-------+ Brachial      Full       Yes       Yes                      +----------+------------+---------+-----------+----------+-------+ Radial        Full                                          +----------+------------+---------+-----------+----------+-------+ Ulnar         Full                                          +----------+------------+---------+-----------+----------+-------+  Cephalic      Full                                          +----------+------------+---------+-----------+----------+-------+ Basilic       Full                                          +----------+------------+---------+-----------+----------+-------+  Left Findings: +----------+------------+---------+-----------+----------+-------+ LEFT      CompressiblePhasicitySpontaneousPropertiesSummary +----------+------------+---------+-----------+----------+-------+ Subclavian    Full       Yes       Yes                      +----------+------------+---------+-----------+----------+-------+  Summary:  Right: No evidence of deep vein thrombosis in the upper extremity. No evidence of superficial vein thrombosis in the upper extremity.  Left: No evidence of thrombosis in the subclavian.  *See table(s) above for measurements and observations.  Diagnosing physician: Heath Lark Electronically signed by Heath Lark on 07/14/2020 at 4:35:25 PM.    Final      TODAY-DAY OF DISCHARGE:  Subjective:   Calla Kicks today has no headache,no chest abdominal pain,no new weakness tingling or numbness, feels much better wants to go home today.   Objective:   Blood pressure (!) 151/58, pulse 79, temperature 99 F (37.2 C), temperature source Oral, resp. rate 18, height 5\' 5"  (1.651 m), weight 55.6 kg, SpO2 98 %.  Intake/Output Summary (Last 24 hours) at 08/06/2020 1102 Last data filed at 08/06/2020 0355 Gross per 24 hour  Intake 350 ml  Output 460 ml  Net -110 ml   Filed Weights   07/22/20 0350 07/23/20 0357 07/24/20 0550  Weight: 59.9 kg 56.1 kg 55.6 kg    Exam: Awake Alert, No new F.N deficits, Normal affect [place].AT,PERRAL Supple Neck,No JVD, No cervical lymphadenopathy appriciated.  Symmetrical Chest wall movement, Good air movement bilaterally, CTAB RRR,No Gallops,Rubs or new Murmurs, No Parasternal Heave +ve B.Sounds, Abd Soft, Non tender, No organomegaly appriciated, No rebound -guarding or rigidity. No Cyanosis, Clubbing or edema, No new Rash or bruise   PERTINENT RADIOLOGIC STUDIES: No results found.   PERTINENT LAB RESULTS: CBC: No results for input(s): WBC, HGB, HCT, PLT in the last 72 hours. CMET CMP     Component Value Date/Time   NA 135 08/04/2020 0040   K 3.6 08/04/2020 0040   CL 101 08/04/2020 0040   CO2 27 08/04/2020 0040   GLUCOSE 174 (H) 08/04/2020 0040   BUN 10 08/04/2020 0040   CREATININE 0.44 08/04/2020 0040   CALCIUM 9.1 08/04/2020 0040   PROT 5.0 (L) 07/29/2020 0043   ALBUMIN 2.3 (L) 07/29/2020 0043   AST 19 07/29/2020 0043   ALT 25 07/29/2020 0043   ALKPHOS 68 07/29/2020 0043   BILITOT 0.3 07/29/2020 0043   GFRNONAA >60 08/04/2020 0040    GFR Estimated Creatinine Clearance: 42.7 mL/min (by C-G formula based on SCr of 0.44 mg/dL). No results for input(s): LIPASE, AMYLASE in the last 72 hours. No results for input(s): CKTOTAL, CKMB, CKMBINDEX,  TROPONINI in the last 72 hours. Invalid input(s): POCBNP No results for input(s): DDIMER in the last 72 hours. No results for input(s): HGBA1C in the last 72 hours. No results for input(s): CHOL, HDL, LDLCALC, TRIG, CHOLHDL, LDLDIRECT in the  last 72 hours. No results for input(s): TSH, T4TOTAL, T3FREE, THYROIDAB in the last 72 hours.  Invalid input(s): FREET3 No results for input(s): VITAMINB12, FOLATE, FERRITIN, TIBC, IRON, RETICCTPCT in the last 72 hours. Coags: No results for input(s): INR in the last 72 hours.  Invalid input(s): PT Microbiology: Recent Results (from the past 240 hour(s))  Resp Panel by RT-PCR (Flu A&B, Covid) Nasopharyngeal Swab     Status: None   Collection Time: 07/29/20 10:46 AM   Specimen: Nasopharyngeal Swab; Nasopharyngeal(NP) swabs in vial transport medium  Result Value Ref Range Status   SARS Coronavirus 2 by RT PCR NEGATIVE NEGATIVE Final    Comment: (NOTE) SARS-CoV-2 target nucleic acids are NOT DETECTED.  The SARS-CoV-2 RNA is generally detectable in upper respiratory specimens during the acute phase of infection. The lowest concentration of SARS-CoV-2 viral copies this assay can detect is 138 copies/mL. A negative result does not preclude SARS-Cov-2 infection and should not be used as the sole basis for treatment or other patient management decisions. A negative result may occur with  improper specimen collection/handling, submission of specimen other than nasopharyngeal swab, presence of viral mutation(s) within the areas targeted by this assay, and inadequate number of viral copies(<138 copies/mL). A negative result must be combined with clinical observations, patient history, and epidemiological information. The expected result is Negative.  Fact Sheet for Patients:  BloggerCourse.com  Fact Sheet for Healthcare Providers:  SeriousBroker.it  This test is no t yet approved or cleared by the  [place] FDA and  has been authorized for detection and/or diagnosis of SARS-CoV-2 by FDA under an Emergency Use Authorization (EUA). This EUA will remain  in effect (meaning this test can be used) for the duration of the COVID-19 declaration under Section 564(b)(1) of the Act, 21 U.S.C.section 360bbb-3(b)(1), unless the authorization is terminated  or revoked sooner.       Influenza A by PCR NEGATIVE NEGATIVE Final   Influenza B by PCR NEGATIVE NEGATIVE Final    Comment: (NOTE) The Xpert Xpress SARS-CoV-2/FLU/RSV plus assay is intended as an aid in the diagnosis of influenza from Nasopharyngeal swab specimens and should not be used as a sole basis for treatment. Nasal washings and aspirates are unacceptable for Xpert Xpress SARS-CoV-2/FLU/RSV testing.  Fact Sheet for Patients: BloggerCourse.com  Fact Sheet for Healthcare Providers: SeriousBroker.it  This test is not yet approved or cleared by the [place] FDA and has been authorized for detection and/or diagnosis of SARS-CoV-2 by FDA under an Emergency Use Authorization (EUA). This EUA will remain in effect (meaning this test can be used) for the duration of the COVID-19 declaration under Section 564(b)(1) of the Act, 21 U.S.C. section 360bbb-3(b)(1), unless the authorization is terminated or revoked.  Performed at [facility], [address] [place], [place] [address]   Culture, blood (Routine X 2) w Reflex to ID Panel     Status: None   Collection Time: 07/29/20 11:25 AM   Specimen: BLOOD LEFT ARM  Result Value Ref Range Status   Specimen Description BLOOD LEFT ARM  Final   Special Requests   Final    BOTTLES DRAWN AEROBIC ONLY Blood Culture adequate volume   Culture   Final    NO GROWTH 5 DAYS Performed at [facility], [address], [place], [place] [address]    Report Status 08/03/2020 FINAL  Final  Culture, blood (Routine X 2) w Reflex to  ID Panel     Status: None  Collection Time: 07/29/20 11:34 AM   Specimen: BLOOD LEFT HAND  Result Value Ref Range Status   Specimen Description BLOOD LEFT HAND  Final   Special Requests   Final    BOTTLES DRAWN AEROBIC ONLY Blood Culture adequate volume   Culture   Final    NO GROWTH 5 DAYS Performed at [facility], [address] [place], [place] [address]    Report Status 08/03/2020 FINAL  Final  Culture, Urine     Status: Abnormal   Collection Time: 07/29/20  4:58 PM   Specimen: Urine, Catheterized  Result Value Ref Range Status   Specimen Description URINE, CATHETERIZED  Final   Special Requests   Final    NONE Performed at [facility], [address], [place], [place] [address]    Culture (A)  Final    >=100,000 COLONIES/mL ENTEROCOCCUS FAECALIS VANCOMYCIN RESISTANT ENTEROCOCCUS ISOLATED    Report Status 07/31/2020 FINAL  Final   Organism ID, Bacteria ENTEROCOCCUS FAECALIS (A)  Final      Susceptibility   Enterococcus faecalis - MIC*    AMPICILLIN <=2 SENSITIVE Sensitive     NITROFURANTOIN <=16 SENSITIVE Sensitive     VANCOMYCIN >=32 RESISTANT Resistant     LINEZOLID 2 SENSITIVE Sensitive     * >=100,000 COLONIES/mL ENTEROCOCCUS FAECALIS    FURTHER DISCHARGE INSTRUCTIONS:  Get Medicines reviewed and adjusted: Please take all your medications with you for your next visit with your Primary MD  Laboratory/radiological data: Please request your Primary MD to go over all hospital tests and procedure/radiological results at the follow up, please ask your Primary MD to get all Hospital records sent to his/her office.  In some cases, they will be blood work, cultures and biopsy results pending at the time of your discharge. Please request that your primary care M.D. goes through all the records of your hospital data and follows up on these results.  Also Note the following: If you experience worsening of your admission symptoms, develop shortness of breath,  life threatening emergency, suicidal or homicidal thoughts you must seek medical attention immediately by calling 911 or calling your MD immediately  if symptoms less severe.  You must read complete instructions/literature along with all the possible adverse reactions/side effects for all the Medicines you take and that have been prescribed to you. Take any new Medicines after you have completely understood and accpet all the possible adverse reactions/side effects.   Do not drive when taking Pain medications or sleeping medications (Benzodaizepines)  Do not take more than prescribed Pain, Sleep and Anxiety Medications. It is not advisable to combine anxiety,sleep and pain medications without talking with your primary care practitioner  Special Instructions: If you have smoked or chewed Tobacco  in the last 2 yrs please stop smoking, stop any regular Alcohol  and or any Recreational drug use.  Wear Seat belts while driving.  Please note: You were cared for by a hospitalist during your hospital stay. Once you are discharged, your primary care physician will handle any further medical issues. Please note that NO REFILLS for any discharge medications will be authorized once you are discharged, as it is imperative that you return to your primary care physician (or establish a relationship with a primary care physician if you do not have one) for your post hospital discharge needs so that they can reassess your need for medications and monitor your lab values.  Total Time spent coordinating discharge including counseling, education and face  to face time equals 35 minutes.  SignedJeoffrey Massed 08/06/2020 11:02 AM

## 2020-08-08 NOTE — Progress Notes (Signed)
Patient transported via [facility] to [facility]. IV removed. Foley catheter left in place pending patient's outpatient follow-up with Urology. Report called to [facility] and taken by receiving RN.

## 2020-08-08 NOTE — Plan of Care (Signed)

## 2020-08-09 ENCOUNTER — Encounter (HOSPITAL_COMMUNITY): Payer: Self-pay | Admitting: Neurological Surgery

## 2020-08-13 DIAGNOSIS — Z139 Encounter for screening, unspecified: Secondary | ICD-10-CM | POA: Diagnosis not present

## 2020-08-13 DIAGNOSIS — I10 Essential (primary) hypertension: Secondary | ICD-10-CM | POA: Diagnosis not present

## 2020-08-13 DIAGNOSIS — E119 Type 2 diabetes mellitus without complications: Secondary | ICD-10-CM | POA: Diagnosis not present

## 2020-08-13 DIAGNOSIS — D649 Anemia, unspecified: Secondary | ICD-10-CM | POA: Diagnosis not present

## 2020-08-13 DIAGNOSIS — R5383 Other fatigue: Secondary | ICD-10-CM | POA: Diagnosis not present

## 2020-08-13 DIAGNOSIS — E43 Unspecified severe protein-calorie malnutrition: Secondary | ICD-10-CM | POA: Diagnosis not present

## 2020-08-18 DIAGNOSIS — I10 Essential (primary) hypertension: Secondary | ICD-10-CM | POA: Diagnosis not present

## 2020-08-18 DIAGNOSIS — L89623 Pressure ulcer of left heel, stage 3: Secondary | ICD-10-CM | POA: Diagnosis not present

## 2020-08-18 DIAGNOSIS — E119 Type 2 diabetes mellitus without complications: Secondary | ICD-10-CM | POA: Diagnosis not present

## 2020-08-18 DIAGNOSIS — E871 Hypo-osmolality and hyponatremia: Secondary | ICD-10-CM | POA: Diagnosis not present

## 2020-08-18 DIAGNOSIS — R627 Adult failure to thrive: Secondary | ICD-10-CM | POA: Diagnosis not present

## 2020-08-18 DIAGNOSIS — L89893 Pressure ulcer of other site, stage 3: Secondary | ICD-10-CM | POA: Diagnosis not present

## 2020-08-24 DIAGNOSIS — L89893 Pressure ulcer of other site, stage 3: Secondary | ICD-10-CM | POA: Diagnosis not present

## 2020-08-24 DIAGNOSIS — L89623 Pressure ulcer of left heel, stage 3: Secondary | ICD-10-CM | POA: Diagnosis not present

## 2020-08-25 DIAGNOSIS — E119 Type 2 diabetes mellitus without complications: Secondary | ICD-10-CM | POA: Diagnosis not present

## 2020-08-25 DIAGNOSIS — G2581 Restless legs syndrome: Secondary | ICD-10-CM | POA: Diagnosis not present

## 2020-08-25 DIAGNOSIS — M79604 Pain in right leg: Secondary | ICD-10-CM | POA: Diagnosis not present

## 2020-08-25 DIAGNOSIS — L89623 Pressure ulcer of left heel, stage 3: Secondary | ICD-10-CM | POA: Diagnosis not present

## 2020-08-25 DIAGNOSIS — M62838 Other muscle spasm: Secondary | ICD-10-CM | POA: Diagnosis not present

## 2020-08-25 DIAGNOSIS — F0390 Unspecified dementia without behavioral disturbance: Secondary | ICD-10-CM | POA: Diagnosis not present

## 2020-08-25 DIAGNOSIS — L89893 Pressure ulcer of other site, stage 3: Secondary | ICD-10-CM | POA: Diagnosis not present

## 2020-08-25 DIAGNOSIS — R627 Adult failure to thrive: Secondary | ICD-10-CM | POA: Diagnosis not present

## 2020-08-26 DIAGNOSIS — M62838 Other muscle spasm: Secondary | ICD-10-CM | POA: Diagnosis not present

## 2020-08-26 DIAGNOSIS — G2581 Restless legs syndrome: Secondary | ICD-10-CM | POA: Diagnosis not present

## 2020-08-27 DIAGNOSIS — D649 Anemia, unspecified: Secondary | ICD-10-CM | POA: Diagnosis not present

## 2020-08-27 DIAGNOSIS — E43 Unspecified severe protein-calorie malnutrition: Secondary | ICD-10-CM | POA: Diagnosis not present

## 2020-08-30 DIAGNOSIS — R338 Other retention of urine: Secondary | ICD-10-CM | POA: Diagnosis not present

## 2020-09-01 DIAGNOSIS — L89893 Pressure ulcer of other site, stage 3: Secondary | ICD-10-CM | POA: Diagnosis not present

## 2020-09-01 DIAGNOSIS — L89623 Pressure ulcer of left heel, stage 3: Secondary | ICD-10-CM | POA: Diagnosis not present

## 2020-09-01 DIAGNOSIS — E119 Type 2 diabetes mellitus without complications: Secondary | ICD-10-CM | POA: Diagnosis not present

## 2020-09-01 DIAGNOSIS — R627 Adult failure to thrive: Secondary | ICD-10-CM | POA: Diagnosis not present

## 2020-09-06 DIAGNOSIS — M62838 Other muscle spasm: Secondary | ICD-10-CM | POA: Diagnosis not present

## 2020-09-06 DIAGNOSIS — U071 COVID-19: Secondary | ICD-10-CM | POA: Diagnosis not present

## 2020-09-06 DIAGNOSIS — M79604 Pain in right leg: Secondary | ICD-10-CM | POA: Diagnosis not present

## 2020-09-06 DIAGNOSIS — F0390 Unspecified dementia without behavioral disturbance: Secondary | ICD-10-CM | POA: Diagnosis not present

## 2020-09-06 DIAGNOSIS — G2581 Restless legs syndrome: Secondary | ICD-10-CM | POA: Diagnosis not present

## 2020-09-06 DIAGNOSIS — R5383 Other fatigue: Secondary | ICD-10-CM | POA: Diagnosis not present

## 2020-09-08 DIAGNOSIS — L89890 Pressure ulcer of other site, unstageable: Secondary | ICD-10-CM | POA: Diagnosis not present

## 2020-09-08 DIAGNOSIS — L89623 Pressure ulcer of left heel, stage 3: Secondary | ICD-10-CM | POA: Diagnosis not present

## 2020-09-08 DIAGNOSIS — E1159 Type 2 diabetes mellitus with other circulatory complications: Secondary | ICD-10-CM | POA: Diagnosis not present

## 2020-09-08 DIAGNOSIS — I739 Peripheral vascular disease, unspecified: Secondary | ICD-10-CM | POA: Diagnosis not present

## 2020-09-09 DIAGNOSIS — G253 Myoclonus: Secondary | ICD-10-CM | POA: Diagnosis not present

## 2020-09-09 DIAGNOSIS — G894 Chronic pain syndrome: Secondary | ICD-10-CM | POA: Diagnosis not present

## 2020-09-09 DIAGNOSIS — F0390 Unspecified dementia without behavioral disturbance: Secondary | ICD-10-CM | POA: Diagnosis not present

## 2020-09-09 DIAGNOSIS — U071 COVID-19: Secondary | ICD-10-CM | POA: Diagnosis not present

## 2020-09-09 DIAGNOSIS — M62838 Other muscle spasm: Secondary | ICD-10-CM | POA: Diagnosis not present

## 2020-09-09 DIAGNOSIS — R627 Adult failure to thrive: Secondary | ICD-10-CM | POA: Diagnosis not present

## 2020-09-09 DIAGNOSIS — R600 Localized edema: Secondary | ICD-10-CM | POA: Diagnosis not present

## 2020-09-10 DIAGNOSIS — U071 COVID-19: Secondary | ICD-10-CM | POA: Diagnosis not present

## 2020-09-10 DIAGNOSIS — M62838 Other muscle spasm: Secondary | ICD-10-CM | POA: Diagnosis not present

## 2020-09-10 DIAGNOSIS — G894 Chronic pain syndrome: Secondary | ICD-10-CM | POA: Diagnosis not present

## 2020-09-10 DIAGNOSIS — F0390 Unspecified dementia without behavioral disturbance: Secondary | ICD-10-CM | POA: Diagnosis not present

## 2020-09-15 DIAGNOSIS — G894 Chronic pain syndrome: Secondary | ICD-10-CM | POA: Diagnosis not present

## 2020-09-15 DIAGNOSIS — R627 Adult failure to thrive: Secondary | ICD-10-CM | POA: Diagnosis not present

## 2020-09-15 DIAGNOSIS — L89623 Pressure ulcer of left heel, stage 3: Secondary | ICD-10-CM | POA: Diagnosis not present

## 2020-09-15 DIAGNOSIS — L89626 Pressure-induced deep tissue damage of left heel: Secondary | ICD-10-CM | POA: Diagnosis not present

## 2020-09-15 DIAGNOSIS — E1159 Type 2 diabetes mellitus with other circulatory complications: Secondary | ICD-10-CM | POA: Diagnosis not present

## 2020-09-15 DIAGNOSIS — L89890 Pressure ulcer of other site, unstageable: Secondary | ICD-10-CM | POA: Diagnosis not present

## 2020-09-15 DIAGNOSIS — F0390 Unspecified dementia without behavioral disturbance: Secondary | ICD-10-CM | POA: Diagnosis not present

## 2020-09-15 DIAGNOSIS — Z515 Encounter for palliative care: Secondary | ICD-10-CM | POA: Diagnosis not present

## 2020-09-16 DIAGNOSIS — L89623 Pressure ulcer of left heel, stage 3: Secondary | ICD-10-CM | POA: Diagnosis not present

## 2020-09-16 DIAGNOSIS — L89893 Pressure ulcer of other site, stage 3: Secondary | ICD-10-CM | POA: Diagnosis not present

## 2020-09-16 DIAGNOSIS — L89890 Pressure ulcer of other site, unstageable: Secondary | ICD-10-CM | POA: Diagnosis not present

## 2020-09-17 DIAGNOSIS — D649 Anemia, unspecified: Secondary | ICD-10-CM | POA: Diagnosis not present

## 2020-09-19 ENCOUNTER — Emergency Department (HOSPITAL_COMMUNITY): Payer: Medicare PPO

## 2020-09-19 ENCOUNTER — Encounter (HOSPITAL_COMMUNITY): Payer: Self-pay

## 2020-09-19 ENCOUNTER — Inpatient Hospital Stay (HOSPITAL_COMMUNITY)
Admission: EM | Admit: 2020-09-19 | Discharge: 2020-09-23 | DRG: 193 | Disposition: A | Discharge status: Skilled Nursing Facility | Payer: Medicare PPO | Source: Skilled Nursing Facility | Attending: Internal Medicine | Admitting: Internal Medicine

## 2020-09-19 ENCOUNTER — Other Ambulatory Visit: Payer: Self-pay

## 2020-09-19 DIAGNOSIS — Z79899 Other long term (current) drug therapy: Secondary | ICD-10-CM

## 2020-09-19 DIAGNOSIS — E440 Moderate protein-calorie malnutrition: Secondary | ICD-10-CM | POA: Diagnosis not present

## 2020-09-19 DIAGNOSIS — R7889 Finding of other specified substances, not normally found in blood: Secondary | ICD-10-CM | POA: Diagnosis not present

## 2020-09-19 DIAGNOSIS — B9562 Methicillin resistant Staphylococcus aureus infection as the cause of diseases classified elsewhere: Secondary | ICD-10-CM

## 2020-09-19 DIAGNOSIS — Z515 Encounter for palliative care: Secondary | ICD-10-CM

## 2020-09-19 DIAGNOSIS — R7989 Other specified abnormal findings of blood chemistry: Secondary | ICD-10-CM

## 2020-09-19 DIAGNOSIS — E1151 Type 2 diabetes mellitus with diabetic peripheral angiopathy without gangrene: Secondary | ICD-10-CM | POA: Diagnosis present

## 2020-09-19 DIAGNOSIS — F0390 Unspecified dementia without behavioral disturbance: Secondary | ICD-10-CM | POA: Diagnosis not present

## 2020-09-19 DIAGNOSIS — J90 Pleural effusion, not elsewhere classified: Secondary | ICD-10-CM | POA: Diagnosis not present

## 2020-09-19 DIAGNOSIS — M4802 Spinal stenosis, cervical region: Secondary | ICD-10-CM | POA: Diagnosis present

## 2020-09-19 DIAGNOSIS — E871 Hypo-osmolality and hyponatremia: Secondary | ICD-10-CM | POA: Diagnosis present

## 2020-09-19 DIAGNOSIS — L89890 Pressure ulcer of other site, unstageable: Secondary | ICD-10-CM | POA: Diagnosis present

## 2020-09-19 DIAGNOSIS — Z9049 Acquired absence of other specified parts of digestive tract: Secondary | ICD-10-CM

## 2020-09-19 DIAGNOSIS — E039 Hypothyroidism, unspecified: Secondary | ICD-10-CM | POA: Diagnosis present

## 2020-09-19 DIAGNOSIS — Z87891 Personal history of nicotine dependence: Secondary | ICD-10-CM

## 2020-09-19 DIAGNOSIS — D509 Iron deficiency anemia, unspecified: Secondary | ICD-10-CM | POA: Diagnosis present

## 2020-09-19 DIAGNOSIS — Z833 Family history of diabetes mellitus: Secondary | ICD-10-CM

## 2020-09-19 DIAGNOSIS — R410 Disorientation, unspecified: Secondary | ICD-10-CM | POA: Diagnosis not present

## 2020-09-19 DIAGNOSIS — Z6820 Body mass index (BMI) 20.0-20.9, adult: Secondary | ICD-10-CM

## 2020-09-19 DIAGNOSIS — L89623 Pressure ulcer of left heel, stage 3: Secondary | ICD-10-CM | POA: Diagnosis not present

## 2020-09-19 DIAGNOSIS — R52 Pain, unspecified: Secondary | ICD-10-CM

## 2020-09-19 DIAGNOSIS — D72829 Elevated white blood cell count, unspecified: Secondary | ICD-10-CM | POA: Diagnosis not present

## 2020-09-19 DIAGNOSIS — Z7401 Bed confinement status: Secondary | ICD-10-CM

## 2020-09-19 DIAGNOSIS — I700 Atherosclerosis of aorta: Secondary | ICD-10-CM | POA: Diagnosis not present

## 2020-09-19 DIAGNOSIS — Z981 Arthrodesis status: Secondary | ICD-10-CM

## 2020-09-19 DIAGNOSIS — I10 Essential (primary) hypertension: Secondary | ICD-10-CM | POA: Diagnosis present

## 2020-09-19 DIAGNOSIS — M7989 Other specified soft tissue disorders: Secondary | ICD-10-CM | POA: Diagnosis not present

## 2020-09-19 DIAGNOSIS — G928 Other toxic encephalopathy: Secondary | ICD-10-CM | POA: Diagnosis not present

## 2020-09-19 DIAGNOSIS — Z79890 Hormone replacement therapy (postmenopausal): Secondary | ICD-10-CM

## 2020-09-19 DIAGNOSIS — R109 Unspecified abdominal pain: Secondary | ICD-10-CM | POA: Diagnosis not present

## 2020-09-19 DIAGNOSIS — M79601 Pain in right arm: Secondary | ICD-10-CM

## 2020-09-19 DIAGNOSIS — U071 COVID-19: Secondary | ICD-10-CM | POA: Diagnosis present

## 2020-09-19 DIAGNOSIS — N390 Urinary tract infection, site not specified: Secondary | ICD-10-CM | POA: Diagnosis present

## 2020-09-19 DIAGNOSIS — E785 Hyperlipidemia, unspecified: Secondary | ICD-10-CM | POA: Diagnosis present

## 2020-09-19 DIAGNOSIS — L89523 Pressure ulcer of left ankle, stage 3: Secondary | ICD-10-CM | POA: Diagnosis not present

## 2020-09-19 DIAGNOSIS — R0600 Dyspnea, unspecified: Secondary | ICD-10-CM | POA: Diagnosis not present

## 2020-09-19 DIAGNOSIS — Z882 Allergy status to sulfonamides status: Secondary | ICD-10-CM

## 2020-09-19 DIAGNOSIS — E875 Hyperkalemia: Secondary | ICD-10-CM | POA: Diagnosis present

## 2020-09-19 DIAGNOSIS — R1010 Upper abdominal pain, unspecified: Secondary | ICD-10-CM | POA: Diagnosis not present

## 2020-09-19 DIAGNOSIS — Z888 Allergy status to other drugs, medicaments and biological substances status: Secondary | ICD-10-CM

## 2020-09-19 DIAGNOSIS — R7881 Bacteremia: Secondary | ICD-10-CM | POA: Diagnosis not present

## 2020-09-19 DIAGNOSIS — Z7982 Long term (current) use of aspirin: Secondary | ICD-10-CM

## 2020-09-19 DIAGNOSIS — L89509 Pressure ulcer of unspecified ankle, unspecified stage: Secondary | ICD-10-CM | POA: Diagnosis present

## 2020-09-19 DIAGNOSIS — R600 Localized edema: Secondary | ICD-10-CM | POA: Diagnosis present

## 2020-09-19 DIAGNOSIS — Z90710 Acquired absence of both cervix and uterus: Secondary | ICD-10-CM

## 2020-09-19 DIAGNOSIS — E43 Unspecified severe protein-calorie malnutrition: Secondary | ICD-10-CM | POA: Diagnosis not present

## 2020-09-19 DIAGNOSIS — Z20822 Contact with and (suspected) exposure to covid-19: Secondary | ICD-10-CM | POA: Diagnosis not present

## 2020-09-19 DIAGNOSIS — R627 Adult failure to thrive: Secondary | ICD-10-CM | POA: Diagnosis present

## 2020-09-19 DIAGNOSIS — Z8616 Personal history of COVID-19: Secondary | ICD-10-CM

## 2020-09-19 DIAGNOSIS — R7401 Elevation of levels of liver transaminase levels: Secondary | ICD-10-CM | POA: Diagnosis present

## 2020-09-19 DIAGNOSIS — M19011 Primary osteoarthritis, right shoulder: Secondary | ICD-10-CM | POA: Diagnosis not present

## 2020-09-19 DIAGNOSIS — R609 Edema, unspecified: Secondary | ICD-10-CM | POA: Diagnosis not present

## 2020-09-19 DIAGNOSIS — R918 Other nonspecific abnormal finding of lung field: Secondary | ICD-10-CM | POA: Diagnosis not present

## 2020-09-19 DIAGNOSIS — Z1621 Resistance to vancomycin: Secondary | ICD-10-CM | POA: Diagnosis present

## 2020-09-19 DIAGNOSIS — Z66 Do not resuscitate: Secondary | ICD-10-CM | POA: Diagnosis present

## 2020-09-19 DIAGNOSIS — J189 Pneumonia, unspecified organism: Principal | ICD-10-CM

## 2020-09-19 DIAGNOSIS — M1711 Unilateral primary osteoarthritis, right knee: Secondary | ICD-10-CM | POA: Diagnosis present

## 2020-09-19 DIAGNOSIS — E119 Type 2 diabetes mellitus without complications: Secondary | ICD-10-CM

## 2020-09-19 HISTORY — DX: COVID-19: U07.1

## 2020-09-19 HISTORY — DX: Anemia in other chronic diseases classified elsewhere: D63.8

## 2020-09-19 HISTORY — DX: Other retention of urine: R33.8

## 2020-09-19 HISTORY — DX: Unilateral primary osteoarthritis, right knee: M17.11

## 2020-09-19 HISTORY — DX: Atherosclerosis of aorta: I70.0

## 2020-09-19 HISTORY — DX: Other muscle spasm: M62.838

## 2020-09-19 HISTORY — DX: Muscle weakness (generalized): M62.81

## 2020-09-19 HISTORY — DX: Dysphagia, oropharyngeal phase: R13.12

## 2020-09-19 HISTORY — DX: Adult failure to thrive: R62.7

## 2020-09-19 HISTORY — DX: Need for assistance with personal care: Z74.1

## 2020-09-19 HISTORY — DX: Moderate protein-calorie malnutrition: E44.0

## 2020-09-19 HISTORY — DX: Spinal stenosis, cervical region: M48.02

## 2020-09-19 HISTORY — DX: Other reduced mobility: Z74.09

## 2020-09-19 HISTORY — DX: Nontraumatic extradural hemorrhage: I62.1

## 2020-09-19 LAB — CBC WITH DIFFERENTIAL/PLATELET
Abs Immature Granulocytes: 0.39 10*3/uL — ABNORMAL HIGH (ref 0.00–0.07)
Basophils Absolute: 0.1 10*3/uL (ref 0.0–0.1)
Basophils Relative: 0 %
Eosinophils Absolute: 0.1 10*3/uL (ref 0.0–0.5)
Eosinophils Relative: 1 %
HCT: 34.7 % — ABNORMAL LOW (ref 36.0–46.0)
Hemoglobin: 10.4 g/dL — ABNORMAL LOW (ref 12.0–15.0)
Immature Granulocytes: 2 %
Lymphocytes Relative: 4 %
Lymphs Abs: 0.8 10*3/uL (ref 0.7–4.0)
MCH: 25.6 pg — ABNORMAL LOW (ref 26.0–34.0)
MCHC: 30 g/dL (ref 30.0–36.0)
MCV: 85.3 fL (ref 80.0–100.0)
Monocytes Absolute: 1.2 10*3/uL — ABNORMAL HIGH (ref 0.1–1.0)
Monocytes Relative: 6 %
Neutro Abs: 18.3 10*3/uL — ABNORMAL HIGH (ref 1.7–7.7)
Neutrophils Relative %: 87 %
Platelets: 433 10*3/uL — ABNORMAL HIGH (ref 150–400)
RBC: 4.07 MIL/uL (ref 3.87–5.11)
RDW: 16.4 % — ABNORMAL HIGH (ref 11.5–15.5)
WBC: 20.9 10*3/uL — ABNORMAL HIGH (ref 4.0–10.5)
nRBC: 0 % (ref 0.0–0.2)

## 2020-09-19 LAB — URINALYSIS, ROUTINE W REFLEX MICROSCOPIC
Bacteria, UA: NONE SEEN
Bilirubin Urine: NEGATIVE
Glucose, UA: NEGATIVE mg/dL
Ketones, ur: 5 mg/dL — AB
Nitrite: NEGATIVE
Protein, ur: 30 mg/dL — AB
Specific Gravity, Urine: >1.046 — ABNORMAL HIGH (ref 1.005–1.030)
WBC, UA: >50 WBC/hpf — ABNORMAL HIGH (ref 0–5)
pH: 8 (ref 5.0–8.0)

## 2020-09-19 LAB — HEPATITIS PANEL, ACUTE
HCV Ab: NONREACTIVE
Hep A IgM: NONREACTIVE
Hep B C IgM: NONREACTIVE
Hepatitis B Surface Ag: NONREACTIVE

## 2020-09-19 LAB — LIPASE, BLOOD: Lipase: 30 U/L (ref 11–51)

## 2020-09-19 LAB — COMPREHENSIVE METABOLIC PANEL
ALT: 146 U/L — ABNORMAL HIGH (ref 0–44)
AST: 71 U/L — ABNORMAL HIGH (ref 15–41)
Albumin: 1.6 g/dL — ABNORMAL LOW (ref 3.5–5.0)
Alkaline Phosphatase: 184 U/L — ABNORMAL HIGH (ref 38–126)
Anion gap: 8 (ref 5–15)
BUN: 17 mg/dL (ref 8–23)
CO2: 28 mmol/L (ref 22–32)
Calcium: 9.1 mg/dL (ref 8.9–10.3)
Chloride: 96 mmol/L — ABNORMAL LOW (ref 98–111)
Creatinine, Ser: 0.5 mg/dL (ref 0.44–1.00)
GFR, Estimated: >60 mL/min (ref >60)
Glucose, Bld: 177 mg/dL — ABNORMAL HIGH (ref 70–99)
Potassium: 5.2 mmol/L — ABNORMAL HIGH (ref 3.5–5.1)
Sodium: 132 mmol/L — ABNORMAL LOW (ref 135–145)
Total Bilirubin: 0.8 mg/dL (ref 0.3–1.2)
Total Protein: 6.5 g/dL (ref 6.5–8.1)

## 2020-09-19 LAB — PROTIME-INR
INR: 1.2 (ref 0.8–1.2)
Prothrombin Time: 15.1 seconds (ref 11.4–15.2)

## 2020-09-19 LAB — CBG MONITORING, ED: Glucose-Capillary: 147 mg/dL — ABNORMAL HIGH (ref 70–99)

## 2020-09-19 LAB — AMMONIA: Ammonia: 9 umol/L (ref 9–35)

## 2020-09-19 LAB — RESP PANEL BY RT-PCR (FLU A&B, COVID) ARPGX2
Influenza A by PCR: NEGATIVE
Influenza B by PCR: NEGATIVE
SARS Coronavirus 2 by RT PCR: POSITIVE — AB

## 2020-09-19 LAB — LACTIC ACID, PLASMA: Lactic Acid, Venous: 1.4 mmol/L (ref 0.5–1.9)

## 2020-09-19 MED ORDER — ROPINIROLE HCL 0.25 MG PO TABS
0.2500 mg | ORAL_TABLET | Freq: Every day | ORAL | Status: DC
  Administered 2020-09-19: 0.25 mg via ORAL
  Filled 2020-09-19 (×2): qty 1

## 2020-09-19 MED ORDER — LEVOTHYROXINE SODIUM 25 MCG PO TABS: 25.0000 ug | ORAL_TABLET | Freq: Every day | ORAL | Status: DC

## 2020-09-19 MED ORDER — INSULIN ASPART 100 UNIT/ML IJ SOLN
0.0000 [IU] | Freq: Three times a day (TID) | INTRAMUSCULAR | Status: DC
  Administered 2020-09-20: 1 [IU] via SUBCUTANEOUS

## 2020-09-19 MED ORDER — ENSURE ENLIVE PO LIQD
237.0000 mL | Freq: Two times a day (BID) | ORAL | Status: DC
  Administered 2020-09-20 (×2): 237 mL via ORAL
  Filled 2020-09-19: qty 237

## 2020-09-19 MED ORDER — GUAIFENESIN-DM 100-10 MG/5ML PO SYRP: 10.0000 mL | ORAL_SOLUTION | ORAL | Status: DC | PRN

## 2020-09-19 MED ORDER — IOHEXOL 300 MG/ML  SOLN
100.0000 mL | Freq: Once | INTRAMUSCULAR | Status: AC | PRN
  Administered 2020-09-19: 100 mL via INTRAVENOUS

## 2020-09-19 MED ORDER — SODIUM CHLORIDE 0.9 % IV SOLN
1.0000 g | Freq: Once | INTRAVENOUS | Status: AC
  Administered 2020-09-19: 1 g via INTRAVENOUS
  Filled 2020-09-19: qty 10

## 2020-09-19 MED ORDER — SODIUM CHLORIDE 0.9 % IV SOLN
1.0000 g | INTRAVENOUS | Status: DC
  Administered 2020-09-20: 1 g via INTRAVENOUS
  Filled 2020-09-19: qty 1
  Filled 2020-09-19: qty 10

## 2020-09-19 MED ORDER — SODIUM CHLORIDE 0.9 % IV SOLN
500.0000 mg | Freq: Once | INTRAVENOUS | Status: AC
  Administered 2020-09-19: 500 mg via INTRAVENOUS
  Filled 2020-09-19: qty 500

## 2020-09-19 MED ORDER — ACETAMINOPHEN 325 MG PO TABS: 650.0000 mg | ORAL_TABLET | ORAL | Status: DC | PRN

## 2020-09-19 MED ORDER — SODIUM CHLORIDE 0.9 % IV SOLN: INTRAVENOUS | Status: AC

## 2020-09-19 MED ORDER — MEMANTINE HCL 10 MG PO TABS
10.0000 mg | ORAL_TABLET | Freq: Every morning | ORAL | Status: DC
  Administered 2020-09-20 – 2020-09-21 (×2): 10 mg via ORAL
  Filled 2020-09-19 (×3): qty 1

## 2020-09-19 MED ORDER — MIDODRINE HCL 5 MG PO TABS
2.5000 mg | ORAL_TABLET | Freq: Two times a day (BID) | ORAL | Status: DC
  Administered 2020-09-20: 2.5 mg via ORAL
  Filled 2020-09-19: qty 1

## 2020-09-19 MED ORDER — TRAMADOL HCL 50 MG PO TABS
50.0000 mg | ORAL_TABLET | Freq: Three times a day (TID) | ORAL | Status: DC | PRN
  Administered 2020-09-20: 50 mg via ORAL
  Filled 2020-09-19: qty 1

## 2020-09-19 MED ORDER — MAGNESIUM OXIDE -MG SUPPLEMENT 400 (240 MG) MG PO TABS
400.0000 mg | ORAL_TABLET | Freq: Two times a day (BID) | ORAL | Status: DC
  Administered 2020-09-19 – 2020-09-20 (×2): 400 mg via ORAL
  Filled 2020-09-19 (×2): qty 1

## 2020-09-19 MED ORDER — BACLOFEN 5 MG HALF TABLET
5.0000 mg | ORAL_TABLET | Freq: Three times a day (TID) | ORAL | Status: DC
  Administered 2020-09-19: 5 mg via ORAL
  Filled 2020-09-19 (×5): qty 1

## 2020-09-19 MED ORDER — TAMSULOSIN HCL 0.4 MG PO CAPS: 0.4000 mg | ORAL_CAPSULE | Freq: Every day | ORAL | Status: DC

## 2020-09-19 MED ORDER — ENOXAPARIN SODIUM 40 MG/0.4ML IJ SOSY
40.0000 mg | PREFILLED_SYRINGE | INTRAMUSCULAR | Status: DC
  Administered 2020-09-19: 40 mg via SUBCUTANEOUS
  Filled 2020-09-19: qty 0.4

## 2020-09-19 MED ORDER — DEXTROSE 5 % IV SOLN
250.0000 mg | INTRAVENOUS | Status: DC
  Administered 2020-09-20: 250 mg via INTRAVENOUS
  Filled 2020-09-19 (×2): qty 250

## 2020-09-19 MED ORDER — MAGNESIUM OXIDE 400 MG PO TABS
400.0000 mg | ORAL_TABLET | Freq: Two times a day (BID) | ORAL | Status: DC
  Filled 2020-09-19: qty 1

## 2020-09-19 MED ORDER — SODIUM CHLORIDE 0.9 % IV SOLN: 1.0000 g | INTRAVENOUS | Status: DC

## 2020-09-19 NOTE — ED Notes (Signed)
Pt has 3+ swelling to right arm and 4+ swelling to right hand. Pt has 2+ right radial pulse, cap refill less than 3 sec, warm to touch, pt able to move hand and arm.

## 2020-09-19 NOTE — ED Provider Notes (Signed)
[facility] EMERGENCY DEPARTMENT Provider Note   CSN: [id number] Arrival date & time: 09/19/20  1011     History Chief Complaint  Patient presents with   abnormal labs    Suzanne Fox is a 87 y.o. female.  The history is provided by the patient and medical records. No language interpreter was used.  Illness Location:  Diffuse pains, lab abnormalities Severity:  Severe Onset quality:  Gradual Timing:  Constant Progression:  Unable to specify Associated symptoms: abdominal pain and cough   Associated symptoms: no chest pain, no congestion, no diarrhea, no fever, no loss of consciousness, no nausea, no shortness of breath, no vomiting and no wheezing       Past Medical History:  Diagnosis Date   Adult failure to thrive    Anemia in other chronic diseases classified elsewhere    Arthritis    Atherosclerosis of aorta (HCC)    Dementia without behavioral disturbance (HCC) 06/10/2020   Diabetes mellitus    Dysphagia, oropharyngeal phase    Heart murmur    Hiatal hernia    Hyperlipidemia    Hypertension    Hypothyroidism (acquired) 06/10/2020   Moderate protein-calorie malnutrition (HCC)    Muscle weakness (generalized)    Need for assistance with personal care    Nontraumatic extradural hemorrhage (HCC)    Other muscle spasm    Other reduced mobility    Other retention of urine    Peripheral vascular disease (HCC)    Spinal stenosis in cervical region    Unilateral primary osteoarthritis, right knee     Patient Active Problem List   Diagnosis Date Noted   Pressure injury of skin 07/29/2020   Malnutrition of moderate degree 07/14/2020   Hyponatremia 07/13/2020   Benign essential HTN 07/13/2020   Type 2 diabetes mellitus without complication (HCC) 07/13/2020   Protein-calorie malnutrition, severe 06/14/2020   Epidural hematoma (HCC) 06/11/2020   Right shoulder pain 06/10/2020   Nontraumatic epidural hematoma (HCC) 06/10/2020   Dementia  without behavioral disturbance (HCC) 06/10/2020   Hypothyroidism (acquired) 06/10/2020   Hypertension    Hyperlipidemia    Pain due to onychomycosis of toenails of both feet 09/05/2019   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     Family History  Problem Relation Age of Onset   Other Mother        At childbirth   Prostate cancer Father    Cancer Maternal Aunt    Asthma Sister    Diabetes Sister    Diabetes Sister    Diabetes Sister     Social History   Tobacco Use   Smoking status: Former    Packs/day: 1.00    Years: 50.00    Pack years: 50.00    Types: Cigarettes    Quit date: 08/27/2004    Years since quitting: 16.0   Smokeless tobacco: Never  Substance Use Topics   Alcohol use: No   Drug use: No    Home Medications Prior to Admission medications   Medication Sig Start Date End Date Taking? Authorizing Provider  acetaminophen (TYLENOL) 325 MG tablet  Take 650 mg by mouth every 4 (four) hours as needed for mild pain.    [provider]  amLODipine (NORVASC) 10 MG tablet Take 10 mg by mouth daily.    [provider]  aspirin EC 81 MG tablet Take 81 mg by mouth daily.    [provider]  atenolol-chlorthalidone (TENORETIC) 50-25 MG per tablet Take 1 tablet by mouth daily.    [provider]  atorvastatin (LIPITOR) 40 MG tablet Take 40 mg by mouth daily.    [provider]  augmented betamethasone dipropionate (DIPROLENE-AF) 0.05 % ointment Apply 1 application topically daily as needed (rash). 10/27/19   [provider]  cloNIDine  (CATAPRES) 0.1 MG tablet Take 0.1 mg by mouth 2 (two) times daily. 09/29/19   [provider]  diclofenac Sodium (VOLTAREN) 1 % GEL Apply 2 g topically 4 (four) times daily. 08/06/20   Ghimire, Werner Lean, MD  feeding supplement (ENSURE ENLIVE / ENSURE PLUS) LIQD Take 237 mLs by mouth 2 (two) times daily between meals. 08/06/20   Ghimire, Werner Lean, MD  levothyroxine (SYNTHROID) 25 MCG tablet Take 25 mcg by mouth daily before breakfast.    [provider]  levothyroxine (SYNTHROID, LEVOTHROID) 25 MCG tablet Take 25 mcg by mouth daily.     [provider]  lisinopril (PRINIVIL,ZESTRIL) 40 MG tablet Take 80 mg by mouth daily.     [provider]  memantine (NAMENDA) 10 MG tablet Take 10 mg by mouth every morning. 12/09/19   [provider]  memantine (NAMENDA) 10 MG tablet Take 10 mg by mouth daily.    [provider]  methocarbamol (ROBAXIN) 500 MG tablet Take 1 tablet (500 mg total) by mouth every 6 (six) hours as needed for muscle spasms. 06/18/20   Cipriano Bunker, MD  midodrine (PROAMATINE) 2.5 MG tablet Take 1 tablet (2.5 mg total) by mouth 2 (two) times daily with a meal. 08/06/20   Ghimire, Werner Lean, MD  Nystatin (GERHARDT'S BUTT CREAM) CREA Apply 1 application topically daily. 07/26/20   Rhetta Mura, MD  sodium chloride 1 g tablet Take 1 tablet (1 g total) by mouth 2 (two) times daily. 06/18/20   Cipriano Bunker, MD  tamsulosin (FLOMAX) 0.4 MG CAPS capsule Take 1 capsule (0.4 mg total) by mouth daily after supper. 08/06/20   Ghimire, Werner Lean, MD    Allergies    Aricept Palma Holter hcl], Aricept [donepezil], Other, and Sulfa antibiotics  Review of Systems   Review of Systems  Unable to perform ROS: Dementia  Constitutional:  Positive for chills. Negative for fever.  HENT:  Negative for congestion.   Respiratory:  Positive for cough. Negative for chest tightness, shortness of breath and wheezing.   Cardiovascular:  Negative for chest  pain and palpitations.  Gastrointestinal:  Positive for abdominal pain. Negative for constipation, diarrhea, nausea and vomiting.  Musculoskeletal:  Negative for back pain.  Neurological:  Negative for loss of consciousness.  Psychiatric/Behavioral:  Positive for agitation.    Physical Exam Updated Vital Signs BP (!) 158/73   Pulse (!) 101   Temp 99.3 F (37.4 C) (Rectal)   Resp 18   Ht  (1.651 m)   Wt 55.6 kg   SpO2 100%   BMI 20.40 kg/m   Physical Exam Vitals and nursing note reviewed.  Constitutional:      General: She is not in acute distress.    Appearance: She is well-developed. She is ill-appearing. She is not toxic-appearing or  diaphoretic.  HENT:     Head: Normocephalic and atraumatic.     Mouth/Throat:     Mouth: Mucous membranes are moist.  Eyes:     Conjunctiva/sclera: Conjunctivae normal.  Cardiovascular:     Rate and Rhythm: Regular rhythm. Tachycardia present.     Heart sounds: No murmur heard. Pulmonary:     Effort: Pulmonary effort is normal. No respiratory distress.     Breath sounds: Rhonchi present. No wheezing or rales.  Chest:     Chest wall: No tenderness.  Abdominal:     General: Abdomen is flat.     Palpations: Abdomen is soft.     Tenderness: There is abdominal tenderness. There is no right CVA tenderness, left CVA tenderness, guarding or rebound.  Musculoskeletal:        General: Swelling (r hand and wrist) present. No tenderness.     Cervical back: Neck supple.     Right lower leg: No edema.     Left lower leg: No edema.  Skin:    General: Skin is warm and dry.     Findings: No erythema.  Neurological:     General: No focal deficit present.     Mental Status: She is alert.    ED Results / Procedures / Treatments   Labs (all labs ordered are listed, but only abnormal results are displayed) Labs Reviewed  CBC WITH DIFFERENTIAL/PLATELET - Abnormal; Notable for the following components:      Result Value   WBC 20.9 (*)     Hemoglobin 10.4 (*)    HCT 34.7 (*)    MCH 25.6 (*)    RDW 16.4 (*)    Platelets 433 (*)    Neutro Abs 18.3 (*)    Monocytes Absolute 1.2 (*)    Abs Immature Granulocytes 0.39 (*)    All other components within normal limits  COMPREHENSIVE METABOLIC PANEL - Abnormal; Notable for the following components:   Sodium 132 (*)    Potassium 5.2 (*)    Chloride 96 (*)    Glucose, Bld 177 (*)    Albumin 1.6 (*)    AST 71 (*)    ALT 146 (*)    Alkaline Phosphatase 184 (*)    All other components within normal limits  CULTURE, BLOOD (ROUTINE X 2)  CULTURE, BLOOD (ROUTINE X 2)  RESP PANEL BY RT-PCR (FLU A&B, COVID) ARPGX2  URINE CULTURE  LACTIC ACID, PLASMA  LIPASE, BLOOD  HEPATITIS PANEL, ACUTE  PROTIME-INR  AMMONIA  LACTIC ACID, PLASMA  URINALYSIS, ROUTINE W REFLEX MICROSCOPIC    EKG EKG Interpretation  Date/Time:  Sunday September 19 2020 10:21:21 EDT Ventricular Rate:  97 PR Interval:  176 QRS Duration: 70 QT Interval:  319 QTC Calculation: 406 R Axis:   32 Text Interpretation: Sinus rhythm When compared to prior, faster rate. No STEMI Confirmed by Theda Belfastegeler, Chris ([id number]) on 09/19/2020 10:44:59 AM  Radiology DG Chest 1 View  Result Date: 09/19/2020 CLINICAL DATA:  Confused patient. No known injury. Patient uncooperative for exam. EXAM: CHEST  1 VIEW COMPARISON:  06/10/2020. FINDINGS: Hazy opacity is noted at the left lung base obscuring the hemidiaphragm, new since the prior exam. Remainder of the lungs is clear. Cardiac silhouette is normal in size. Dense aortic atherosclerotic calcifications. No mediastinal or hilar masses. No pneumothorax. Skeletal structures are demineralized. There are arthropathic changes of the shoulders, most severe on the right. IMPRESSION: 1. New opacity at the left lung base consistent with a combination of  pleural fluid and atelectasis or pneumonia. 2. No other evidence of an acute abnormality.  No pulmonary edema. Electronically Signed   By: Amie Portland  M.D.   On: 09/19/2020 12:18   DG Wrist Complete Right  Result Date: 09/19/2020 CLINICAL DATA:  Confused patient. No known injury. Patient uncooperative for exam. EXAM: RIGHT WRIST - COMPLETE 3+ VIEW COMPARISON:  None. FINDINGS: No fracture.  No bone lesion. Joints normally spaced and aligned. Mild surrounding soft tissue swelling suggested. IMPRESSION: No fracture or dislocation. Electronically Signed   By: Amie Portland M.D.   On: 09/19/2020 12:17   CT ABDOMEN PELVIS W CONTRAST  Result Date: 09/19/2020 CLINICAL DATA:  Abdominal pain. EXAM: CT ABDOMEN AND PELVIS WITH CONTRAST TECHNIQUE: Multidetector CT imaging of the abdomen and pelvis was performed using the standard protocol following bolus administration of intravenous contrast. CONTRAST:  OMNIPAQUE IOHEXOL 300 MG/ML  SOLN COMPARISON:  None. FINDINGS: Lower chest: Small left and trace right pleural effusions with left greater than right lower lobe atelectasis. Hepatobiliary: No focal liver abnormality is seen. No gallstones, gallbladder wall thickening, or biliary dilatation. Pancreas: Unremarkable. No pancreatic ductal dilatation or surrounding inflammatory changes. Spleen: Normal in size without focal abnormality. Adrenals/Urinary Tract: Bilateral adrenal gland thickening. Small bilateral renal cysts. No renal calculi or hydronephrosis. The bladder is decompressed by Foley catheter. Stomach/Bowel: Stomach is within normal limits. Diminutive or absent appendix. No evidence of bowel wall thickening, distention, or inflammatory changes. Vascular/Lymphatic: Aortic atherosclerosis. No enlarged abdominal or pelvic lymph nodes. Reproductive: Status post hysterectomy. No adnexal masses. Other: No abdominal wall hernia or abnormality. No abdominopelvic ascites. No pneumoperitoneum. Musculoskeletal: No acute or significant osseous findings. IMPRESSION: 1. No acute intra-abdominal process. 2. Small left and trace right pleural effusions with left greater  than right lower lobe atelectasis. 3. Aortic Atherosclerosis (ICD10-I70.0). Electronically Signed   By: Obie Dredge M.D.   On: 09/19/2020 13:32   DG Hand Complete Right  Result Date: 09/19/2020 CLINICAL DATA:  Confused patient. No known injury. Patient uncooperative for exam. EXAM: RIGHT HAND - COMPLETE 3+ VIEW COMPARISON:  None. FINDINGS: No fracture or bone lesion.  Skeletal structures are demineralized. Joints are normally aligned. Mild soft tissue swelling dorsally. IMPRESSION: No fracture or dislocation. Electronically Signed   By: Amie Portland M.D.   On: 09/19/2020 12:19   VAS [place] UPPER EXTREMITY VENOUS DUPLEX  Result Date: 09/19/2020 UPPER VENOUS STUDY  Patient Name:  Suzanne Fox  Date of Exam:   09/19/2020 Medical Rec #: [id number]      Accession #:    [id number] Date of Birth: [birth year]      Patient Gender: F Patient Age:   89Y Exam Location:  [facility] Procedure:      VAS [place] UPPER EXTREMITY VENOUS DUPLEX Referring Phys: [id number] Canary Brim Marliyah Reid --------------------------------------------------------------------------------  Indications: Pain Limitations: Non cooperative patient and body habitus. Comparison Study: Prior negative Right UE venous studies done 08/02/20 and 07/14/20 Performing Technologist: Sherren Kerns RVS  Examination Guidelines: A complete evaluation includes B-mode imaging, spectral Doppler, color Doppler, and power Doppler as needed of all accessible portions of each vessel. Bilateral testing is considered an integral part of a complete examination. Limited examinations for reoccurring indications may be performed as noted.  Right Findings: +----------+------------+---------+-----------+----------+---------------+ RIGHT     CompressiblePhasicitySpontaneousProperties    Summary     +----------+------------+---------+-----------+----------+---------------+ IJV  Not visualized   +----------+------------+---------+-----------+----------+---------------+ Subclavian               Yes       Yes                              +----------+------------+---------+-----------+----------+---------------+ Axillary                 Yes       Yes                              +----------+------------+---------+-----------+----------+---------------+ Brachial      Full                                                  +----------+------------+---------+-----------+----------+---------------+ Radial                                              patent by color +----------+------------+---------+-----------+----------+---------------+ Ulnar                                               Not visualized  +----------+------------+---------+-----------+----------+---------------+ Cephalic      Full                                                  +----------+------------+---------+-----------+----------+---------------+ Basilic       Full                                                  +----------+------------+---------+-----------+----------+---------------+  Left Findings: +----------+------------+---------+-----------+----------+-------+ LEFT      CompressiblePhasicitySpontaneousPropertiesSummary +----------+------------+---------+-----------+----------+-------+ Subclavian               Yes       Yes                      +----------+------------+---------+-----------+----------+-------+  Summary:  Right: No obvious evidence of deep vein thrombosis in the visualized veins of the upper extremity. No evidence of superficial vein thrombosis in the upper extremity.  Left: No evidence of thrombosis in the subclavian.  *See table(s) above for measurements and observations.     Preliminary    US Abdomen Limited RUQ (LIVER/GB)  Result Date: 09/19/2020 CLINICAL DATA:  Elevated LFTs EXAM: ULTRASOUND ABDOMEN LIMITED RIGHT UPPER QUADRANT COMPARISON:  CT scan  September 19, 2020 FINDINGS: Gallbladder: No gallstones or wall thickening visualized. No sonographic Murphy sign noted by sonographer. Common bile duct: Diameter: 3.9 mm Liver: No focal lesion identified. Within normal limits in parenchymal echogenicity. Portal vein is patent on color Doppler imaging with normal direction of blood flow towards the liver. Other: Small right pleural effusion. IMPRESSION: 1. A tiny right pleural effusion is identified. 2. No other abnormalities are identified. The study is limited due  to patient immobility and pain. Electronically Signed   By: Gerome Sam III M.D   On: 09/19/2020 14:53    Procedures Procedures   CRITICAL CARE Performed by: Canary Brim Moyinoluwa Dawe Total critical care time: 35 minutes Critical care time was exclusive of separately billable procedures and treating other patients. Critical care was necessary to treat or prevent imminent or life-threatening deterioration. Critical care was time spent personally by me on the following activities: development of treatment plan with patient and/or surrogate as well as nursing, discussions with consultants, evaluation of patient's response to treatment, examination of patient, obtaining history from patient or surrogate, ordering and performing treatments and interventions, ordering and review of laboratory studies, ordering and review of radiographic studies, pulse oximetry and re-evaluation of patient's condition.   Medications Ordered in ED Medications  cefTRIAXone (ROCEPHIN) 1 g in sodium chloride 0.9 % 100 mL IVPB (has no administration in time range)  azithromycin (ZITHROMAX) 500 mg in sodium chloride 0.9 % 250 mL IVPB (has no administration in time range)  iohexol (OMNIPAQUE) 300 MG/ML solution 100 mL (100 mLs Intravenous Contrast Given 09/19/20 1323)    ED Course  I have reviewed the triage vital signs and the nursing notes.  Pertinent labs & imaging results that were available during my care of the  patient were reviewed by me and considered in my medical decision making (see chart for details).    MDM Rules/Calculators/A&P                          KEIONNA KINNAIRD is a 87 y.o. female with a past medical history significant for dementia, hypothyroidism, hypertension, hyperlipidemia, diabetes, prior hysterectomy, prior cholecystectomy, and recently started on antibiotics for urinary tract infection who presents with worsening leukocytosis, LFT elevation, right wrist pain and swelling, right hand pain and swelling, cough, and reports of diffuse pain.  Patient is screaming that she is hurting all over including what she says is chronic leg pain.  She is very demented and difficult historian.  Patient has paperwork accompanying her showing significant LFT elevation and leukocytosis.  Patient is unable to answer many other questions but on exam she objectively has some tenderness and swelling to her right wrist, swelling of her right hand however she is able to grip and appears to have sensation throughout.  No laceration seen.  Did not have tenderness in the elbow or shoulder on my exam.  She had some mildly coarse breath sounds inferiorly and then had diffusely tender abdomen.  Legs RN supports  and did not appear to be more tender from her baseline when asked.  As patient is difficult historian, we will get work-up to try to determine etiology of her symptoms.  We will get a urinalysis and culture.  We will get screening labs.  Due to the abdominal tenderness, LFT elevation, leukocytosis of the facility, and her ill appearance, will get CT of the abdomen pelvis.  We will get chest x-ray and COVID swab.  We will get a DVT ultrasound as well as imaging of the right wrist and right hand.  Anticipate reassessment after work-up to determine disposition.  1:47 PM Work-up is begun to return and patient has completed her CT scan.  The CT scan does not show any acute evidence of significant abnormality in the  abdomen or pelvis but does show some atelectasis and effusion in her lungs.  What is puzzling is her CT does show that she has  a gallbladder still in place despite the chart saying she has a prior cholecystectomy.  Due to the LFT elevation and her diffuse abdominal pain, we will get a right upper quadrant ultrasound to further clarify and evaluate this area however the CT did not show convincing evidence of acute cholecystitis.  We are still waiting on urinalysis to make sure he does not have UTI causing her symptoms.  Anticipate reassessment after work-up is completed.  3:13 PM Patient's x-ray returned showing evidence of pneumonia.  She does have leukocytosis.  No evidence of acute cholecystitis on ultrasound.  Ultrasound showed no evidence of DVT in the arm and no fracture in the wrist or hand.  Due to the patient's worsening vital signs and her tachycardia and illness, will admit for pneumonia.  Still waiting on urinalysis but will call for admission now.   Final Clinical Impression(s) / ED Diagnoses Final diagnoses:  LFT elevation  Leukocytosis, unspecified type  Community acquired pneumonia, unspecified laterality    Clinical Impression: 1. Leukocytosis, unspecified type   2. Pain   3. LFT elevation   4. Community acquired pneumonia, unspecified laterality     Disposition: Admit  This note was prepared with assistance of Conservation officer, historic buildings. Occasional wrong-word or sound-a-like substitutions may have occurred due to the inherent limitations of voice recognition software.     Calista Crain, Canary Brim, MD 09/19/20 [phone]

## 2020-09-19 NOTE — ED Notes (Signed)
I changed pt's foley bag. 

## 2020-09-19 NOTE — ED Notes (Signed)
Patient transported to X-ray 

## 2020-09-19 NOTE — ED Notes (Signed)
I am unable to do orthostatics on pt, her legs are contracted and I don't believe the pt stands or gets out of bed. Pt is also lethargic. I notified Dr Artis Flock of this.

## 2020-09-19 NOTE — ED Triage Notes (Addendum)
Pt arrived via [facility] from [facility] for abnormal WBC and liver labs. Pt has been on atx for past 4 days for tx for UTI. Pt is alert. Pt does not answer questions, but does follow commands to a point. Pt intermittently screams about her legs hurting. Pt has 3+ swelling of right arm and 4+ swelling of right hand that per EMS staff says started today. Pt is NSR on monitor.

## 2020-09-19 NOTE — H&P (Signed)
History and Physical    Suzanne Fox ZOX:[id number] DOB: 08/03/1929 DOA: 09/19/2020  PCP: Merrilee Seashore, MD Consultants:   Patient coming from:  [facility]  Chief Complaint: abdominal pain   HPI: Suzanne Fox is a 87 y.o. female with medical history significant of DM2, HLD, HTN, ACD, hypothyroidism, hyponatremia and dementia presented to ED via [facility] for abnormal lab work. She had elevated WBC and liver labs. She has been on treatment for for UTI since 09/13/2020 with ertapenem.   History obtained from her niece, Suzanne Fox. Baseline is bed bound. She can not feed herself. Typically can carry on conversation, but can not remember her conversation. She is typically able to tell you if she wants something or will tell you if she in pain. She always say her legs hurt and moans if you move them. She states when she saw her on Friday she was confused from her baseline and not very alert. They just increased her baclofen to TID.   Typical diet: puree and will takes pills in food. She has had poor PO intake that is progressing. Her niece also denies any coughing, fever, or shortness of breath.   Of note she tested positive for Covid on 09/06/20 and she had no symptoms. She just finished her isolation this past Friday. Again, niece denies any coughing, URI symptoms or shortness of breath.   ED Course: vitals: temp: 99.3, BP: 158/73, RR: 101 and RR: 18. Oxygen 100% on room air. Lab work noted with WBC of 20.9, AST: 71/ALT: 146 and alk phos of 184. CXR with hazy opacity in LLL-pleural fluid and atelectasis vs PNA.  CT abdo and RUQ [place] with no acute findings. Cultures pending. UA pending . Given rocephin  and zithromax in ER. Asked to admit for pneumonia/sepsis/abnormal labs. . Did not meet criteria for sepsis at time of arrival.    Covid +; however, she tested positive on 6/13 and has not had any coughing/sob/URI symptoms per niece.   Review of Systems: unable to obtain.    Ambulatory Status:  bed bound.   COVID Vaccine Status:  x2 vaccines. (Unsure if pfizer or moderna).   Past Medical History:  Diagnosis Date   Adult failure to thrive    Anemia in other chronic diseases classified elsewhere    Arthritis    Atherosclerosis of aorta ([place])    COVID-19    Dementia without behavioral disturbance ([place]) 06/10/2020   Diabetes mellitus    Dysphagia, oropharyngeal phase    Heart murmur    Hiatal hernia    Hyperlipidemia    Hypertension    Hypothyroidism (acquired) 06/10/2020   Moderate protein-calorie malnutrition (HCC)    Muscle weakness (generalized)    Need for assistance with personal care    Nontraumatic extradural hemorrhage (HCC)    Other muscle spasm    Other reduced mobility    Other retention of urine    Peripheral vascular disease ([place])    Spinal stenosis in cervical region    Unilateral primary osteoarthritis, right knee     Past Surgical History:  Procedure Laterality Date   ABDOMINAL HYSTERECTOMY     CHOLECYSTECTOMY     POSTERIOR CERVICAL FUSION/FORAMINOTOMY N/A 06/10/2020   Procedure: POSTERIOR CERVICAL FUSION/FORAMINOTOMY LEVEL cervical three-cervical six;  Surgeon: Karsten Ro, DO;  Location: [facility];  Service: Neurosurgery;  Laterality: N/A;    Social History   Socioeconomic History   Marital status: Widowed    Spouse name: Not on file  Number of children: 0   Years of education: 12   Highest education level: Not on file  Occupational History   Occupation: Retired    Fish farm manager: RETIRED  Tobacco Use   Smoking status: Former    Packs/day: 1.00    Years: 50.00    Pack years: 50.00    Types: Cigarettes    Quit date: 08/27/2004    Years since quitting: 16.0   Smokeless tobacco: Never  Substance and Sexual Activity   Alcohol use: No   Drug use: No   Sexual activity: Not on file  Other Topics Concern   Not on file  Social History Narrative   ** Merged History Encounter **       Patient lives at home alone.    Caffeine use: 4 cups daily   Social Determinants of Health   Financial Resource Strain: Not on file  Food Insecurity: Not on file  Transportation Needs: Not on file  Physical Activity: Not on file  Stress: Not on file  Social Connections: Not on file  Intimate Partner Violence: Not on file    Allergies  Allergen Reactions   Aricept [Donepezil Hcl]     Per [facility]   Aricept [Donepezil]     Other reaction(s): localised rash   Other Other (See Comments)   Sulfa Antibiotics     Per [facility]     Family History  Problem Relation Age of Onset   Other Mother        At childbirth   Prostate cancer Father    Cancer Maternal Aunt    Asthma Sister    Diabetes Sister    Diabetes Sister    Diabetes Sister     Prior to Admission medications   Medication Sig Start Date End Date Taking? Authorizing Provider  acetaminophen (TYLENOL) 325 MG tablet Take 650 mg by mouth every 4 (four) hours as needed for mild pain.   Yes [provider]  Baclofen 5 MG TABS Take 5 mg by mouth in the morning, at noon, and at bedtime.   Yes [provider]  collagenase (SANTYL) ointment Apply 1 application topically daily. Apply to left heel   Yes [provider]  diclofenac Sodium (VOLTAREN) 1 % GEL Apply 2 g topically 4 (four) times daily. 08/06/20  Yes Ghimire, Henreitta Leber, MD  ERTAPENEM SODIUM IJ Inject 1,000 mg into the muscle daily. X 7 days 09/13/20  Yes [provider]  feeding supplement (ENSURE ENLIVE / ENSURE PLUS) LIQD Take 237 mLs by mouth 2 (two) times daily between meals. 08/06/20  Yes Ghimire, Henreitta Leber, MD  guaiFENesin-dextromethorphan (ROBITUSSIN DM) 100-10 MG/5ML syrup Take 10 mLs by mouth every 4 (four) hours as needed for cough.   Yes [provider]  levothyroxine (SYNTHROID) 25 MCG tablet Take 25 mcg by mouth daily before breakfast.   Yes [provider]  magnesium oxide (MAG-OX) 400 MG tablet Take 400 mg by mouth 2 (two) times daily.   Yes  [provider]  memantine (NAMENDA) 10 MG tablet Take 10 mg by mouth every morning. 12/09/19  Yes [provider]  midodrine (PROAMATINE) 2.5 MG tablet Take 1 tablet (2.5 mg total) by mouth 2 (two) times daily with a meal. 08/06/20  Yes Ghimire, Henreitta Leber, MD  Nystatin (GERHARDT'S BUTT CREAM) CREA Apply 1 application topically daily. 07/26/20  Yes Nita Sells, MD  povidone-Iodine (BETADINE) 5 % SOLN topical solution Apply 1 application topically daily. Apply to pressure wound left knee  Yes [provider]  rOPINIRole (REQUIP) 0.25 MG tablet Take 0.25 mg by mouth at bedtime.   Yes [provider]  tamsulosin (FLOMAX) 0.4 MG CAPS capsule Take 1 capsule (0.4 mg total) by mouth daily after supper. 08/06/20  Yes Ghimire, Henreitta Leber, MD  traMADol (ULTRAM) 50 MG tablet Take 50 mg by mouth every 8 (eight) hours as needed for moderate pain.   Yes [provider]  Wound Dressings ([employer] MANUKA HONEY WOUND) GEL Apply 1 application topically daily.   Yes [provider]  methocarbamol (ROBAXIN) 500 MG tablet Take 1 tablet (500 mg total) by mouth every 6 (six) hours as needed for muscle spasms. Patient not taking: No sig reported 06/18/20   Shawna Clamp, MD  sodium chloride 1 g tablet Take 1 tablet (1 g total) by mouth 2 (two) times daily. Patient not taking: No sig reported 06/18/20   Shawna Clamp, MD    Physical Exam: Vitals:   09/19/20 1220 09/19/20 1300 09/19/20 1507 09/19/20 1530  BP: (!) 147/69 (!) 160/70 (!) 160/79 (!) 152/70  Pulse: 100 99 (!) 104 (!) 103  Resp: '17 15 16 16  ' Temp:      TempSrc:      SpO2: 100% 99% 100% 98%  Weight:      Height:         General:  Appears calm and comfortable and is in NAD. Resting.  Eyes:  PERRL, EOMI, normal lids, iris ENT:  grossly normal hearing, lips & tongue, dried sputum on face and neck that has green color Neck:  no LAD, masses or thyromegaly; no carotid bruits Cardiovascular:   tachycardic, no m/r/g. Pitting edema in left lower leg to above the ankle.  Respiratory:   limited exam. No wheezing/crackles.  Normal respiratory effort. Abdomen:  soft, ND, BS+. Yells if you palpate in "jesus name.". no rebound or guarding.  Back:  cnt.  Skin:  positive ulcer on left medial ankle. Can not probe.  Skin tear of left upper leg just superior and lateral to knee.  Musculoskeletal:  can not assess strength. Curled up and would not follow commands. Mild edema of right hand, forearm.    Limited foot exam with no ulcerations.  2+ distal pulses. Psychiatric:  can not assess. Does not answer questions.  Neurologic:  can not assess. Does not follow commands.     Radiological Exams on Admission: Independently reviewed - see discussion in A/P where applicable  DG Chest 1 View  Result Date: 09/19/2020 CLINICAL DATA:  Confused patient. No known injury. Patient uncooperative for exam. EXAM: CHEST  1 VIEW COMPARISON:  06/10/2020. FINDINGS: Hazy opacity is noted at the left lung base obscuring the hemidiaphragm, new since the prior exam. Remainder of the lungs is clear. Cardiac silhouette is normal in size. Dense aortic atherosclerotic calcifications. No mediastinal or hilar masses. No pneumothorax. Skeletal structures are demineralized. There are arthropathic changes of the shoulders, most severe on the right. IMPRESSION: 1. New opacity at the left lung base consistent with a combination of pleural fluid and atelectasis or pneumonia. 2. No other evidence of an acute abnormality.  No pulmonary edema. Electronically Signed   By: Lajean Manes M.D.   On: 09/19/2020 12:18   DG Wrist Complete Right  Result Date: 09/19/2020 CLINICAL DATA:  Confused patient. No known injury. Patient uncooperative for exam. EXAM: RIGHT WRIST - COMPLETE 3+ VIEW COMPARISON:  None. FINDINGS: No fracture.  No bone lesion. Joints normally spaced and aligned. Mild surrounding soft  tissue swelling suggested. IMPRESSION: No  fracture or dislocation. Electronically Signed   By: Lajean Manes M.D.   On: 09/19/2020 12:17   CT ABDOMEN PELVIS W CONTRAST  Result Date: 09/19/2020 CLINICAL DATA:  Abdominal pain. EXAM: CT ABDOMEN AND PELVIS WITH CONTRAST TECHNIQUE: Multidetector CT imaging of the abdomen and pelvis was performed using the standard protocol following bolus administration of intravenous contrast. CONTRAST:  19m OMNIPAQUE IOHEXOL 300 MG/ML  SOLN COMPARISON:  None. FINDINGS: Lower chest: Small left and trace right pleural effusions with left greater than right lower lobe atelectasis. Hepatobiliary: No focal liver abnormality is seen. No gallstones, gallbladder wall thickening, or biliary dilatation. Pancreas: Unremarkable. No pancreatic ductal dilatation or surrounding inflammatory changes. Spleen: Normal in size without focal abnormality. Adrenals/Urinary Tract: Bilateral adrenal gland thickening. Small bilateral renal cysts. No renal calculi or hydronephrosis. The bladder is decompressed by Foley catheter. Stomach/Bowel: Stomach is within normal limits. Diminutive or absent appendix. No evidence of bowel wall thickening, distention, or inflammatory changes. Vascular/Lymphatic: Aortic atherosclerosis. No enlarged abdominal or pelvic lymph nodes. Reproductive: Status post hysterectomy. No adnexal masses. Other: No abdominal wall hernia or abnormality. No abdominopelvic ascites. No pneumoperitoneum. Musculoskeletal: No acute or significant osseous findings. IMPRESSION: 1. No acute intra-abdominal process. 2. Small left and trace right pleural effusions with left greater than right lower lobe atelectasis. 3. Aortic Atherosclerosis (ICD10-I70.0). Electronically Signed   By: WTitus DubinM.D.   On: 09/19/2020 13:32   DG Hand Complete Right  Result Date: 09/19/2020 CLINICAL DATA:  Confused patient. No known injury. Patient uncooperative for exam. EXAM: RIGHT HAND - COMPLETE 3+ VIEW COMPARISON:  None. FINDINGS: No fracture or  bone lesion.  Skeletal structures are demineralized. Joints are normally aligned. Mild soft tissue swelling dorsally. IMPRESSION: No fracture or dislocation. Electronically Signed   By: DLajean ManesM.D.   On: 09/19/2020 12:19   VAS [place]UPPER EXTREMITY VENOUS DUPLEX  Result Date: 09/19/2020 UPPER VENOUS STUDY  Patient Name:  Suzanne Fox Date of Exam:   09/19/2020 Medical Rec #: [id number]     Accession #:    [id number] of Birth: [birth year]     Patient Gender: F Patient Age:   89Y [facility]:      V[facility] [place]PPER EXTREMITY VENOUS DUPLEX Referring Phys: [id number]--------------------------------------------------------------------------------  Indications: Pain Limitations: Non cooperative patient and body habitus. Comparison Study: Prior negative Right UE venous studies done 08/02/20 and 07/14/20 Performing Technologist: CSharion DoveRVS  Examination Guidelines: A complete evaluation includes B-mode imaging, spectral Doppler, color Doppler, and power Doppler as needed of all accessible portions of each vessel. Bilateral testing is considered an integral part of a complete examination. Limited examinations for reoccurring indications may be performed as noted.  Right Findings: +----------+------------+---------+-----------+----------+---------------+ RIGHT     CompressiblePhasicitySpontaneousProperties    Summary     +----------+------------+---------+-----------+----------+---------------+ IJV                                                 Not visualized  +----------+------------+---------+-----------+----------+---------------+ Subclavian               Yes       Yes                              +----------+------------+---------+-----------+----------+---------------+  Axillary                 Yes       Yes                              +----------+------------+---------+-----------+----------+---------------+ Brachial       Full                                                  +----------+------------+---------+-----------+----------+---------------+ Radial                                              patent by color +----------+------------+---------+-----------+----------+---------------+ Ulnar                                               Not visualized  +----------+------------+---------+-----------+----------+---------------+ Cephalic      Full                                                  +----------+------------+---------+-----------+----------+---------------+ Basilic       Full                                                  +----------+------------+---------+-----------+----------+---------------+  Left Findings: +----------+------------+---------+-----------+----------+-------+ LEFT      CompressiblePhasicitySpontaneousPropertiesSummary +----------+------------+---------+-----------+----------+-------+ Subclavian               Yes       Yes                      +----------+------------+---------+-----------+----------+-------+  Summary:  Right: No obvious evidence of deep vein thrombosis in the visualized veins of the upper extremity. No evidence of superficial vein thrombosis in the upper extremity.  Left: No evidence of thrombosis in the subclavian.  *See table(s) above for measurements and observations.     Preliminary    US Abdomen Limited RUQ (LIVER/GB)  Result Date: 09/19/2020 CLINICAL DATA:  Elevated LFTs EXAM: ULTRASOUND ABDOMEN LIMITED RIGHT UPPER QUADRANT COMPARISON:  CT scan September 19, 2020 FINDINGS: Gallbladder: No gallstones or wall thickening visualized. No sonographic Murphy sign noted by sonographer. Common bile duct: Diameter: 3.9 mm Liver: No focal lesion identified. Within normal limits in parenchymal echogenicity. Portal vein is patent on color Doppler imaging with normal direction of blood flow towards the liver. Other: Small right pleural effusion.  IMPRESSION: 1. A tiny right pleural effusion is identified. 2. No other abnormalities are identified. The study is limited due to patient immobility and pain. Electronically Signed   By: Dorise Bullion III M.D   On: 09/19/2020 14:53    EKG: Independently reviewed.  NSR with rate 97; nonspecific ST changes with no evidence of acute ischemia. Faster rate.    Labs on Admission: I have personally reviewed the available labs and imaging  studies at the time of the admission.  Pertinent labs:  WBC: 20.9 Hgb: 10.4 (baseline: 8.5-10.7) Platelets: 433 Potassium: 5.2 AST: 146 and ALT: 146 Alk phos: 184 Lactic acid: 1.4 Covid + (tested + on 6/13)   CT abdo: no acute process. Small left and trace right pleural effusion RUQ [place]: normal parenchymal echogenicity. No gallstones.    Assessment/Plan Active Problems:   Pneumonia -clinically unsure if she has pneumonia. Will continue IV abx for CAP and follow clinically. No signs of aspiration. No significant findings on exam, oxygen 100%.  -CXR with effusion/atelectasis vs. LLL infiltrate. ? Covid sequelae even though asymptomatic.     Elevated WBC count Known UTI. Urine culture pending. Continue rocephin. Was on ertapenem since 09/13/20. Possible PNA vs. Atelectasis/small effusion. Oxygen 100%. Continue rocephin and azithromycin for CAP and follow clinical course.  -light IVF x 12 hours as she likely has a component of dehydration and hemoconcentration (+ketones in urine) -trend    COVID-19 virus infection -asymptomatically tested positive 2 weeks ago at [facility]. Isolation ended for her on 6/24 after 10 days. Niece denies any symptoms, specifically coughing, fever, shortness of breath or other URI type symptoms.  -outside of treatment window -unsure if has cough now or not, no coughing on exam and can not obtain any history.  -isolation per hospital, although has been 13 days since she tested positive.     Transaminitis -? If due to covid 2 weeks ago  vs. Reactive vs. Antibiotic induced from ertapenem.  -hep panel negative. INR wnl and liver US wnl.  -trend and monitor    Dementia without behavioral disturbance ([place]) -delirium precautions -continue namenda -advanced. Starting to have significantly decreased PO intake. Discussed with her niece about comfort care. She is a DNR. They actually had a meeting today at SNF regarding this, but clearly it was cancelled due to her coming into hospital.  -palliative care f/u and discussion at SNF vs. Inpatient.     Hypertension -mildly elevated today -started on midodrine at her last d/c due to orthostatic hypotension and BP meds held due to orthostasis.  -midodrine has been continued at SNF, will continue here for tomorrow, but may need adjusted if remains HTN.  -will try to check for orthostatic vitals, but do not think she will be able to cooperate with these.     Hyponatremia -at her baseline. Was discharged on salt tablets, but has not been taking these at her SNF.  -continue to monitor. On NO thiazide diuretics.     Pressure ulcer of ankle -can not probe, could be source of elevated WBC. Blood cultures pending -wound care consulted    Edema of right upper extremity -actually had this at her last admit back in may.  -doppler negative for thrombosis -continue to position change prn   Urinary rentention with indwelling foley -has been treated for UTI on ertapenem since 6/20 -UA with possible UTI, continue rocephin for now, follow up on culture  -Voiding trial successful on 5/5-but patient redevelop urinary retention on 5/8.  Foley catheter reinserted-patient had issues with urinary retention in past.  will need to clarify if she has seen urology outpatient.    Hyperlipidemia -not medication.     Hypothyroidism (acquired) -continue home synthroid dose -tsh pending     Type 2 diabetes mellitus without complication ([place]) -W1X 6.8 in April 2022, diet controlled -allow mild  permissive hyperglycemia at her age.  -will add SSI/accuchecks.    Iron deficiency anemia: Hemoglobin stable-Per prior notes-patient not  a candidate for EGD/colonoscopy per gastroenterology.   -follow cbc  Recent cervical neck hematoma-s/p evacuation Emergent  C3-C6 decompression and fusion on 06/10/2020  Moderate protein calorie malnutrition Continue supplementation   Hyperkalemia -5.2. monitor and repeat in AM.   Body mass index is 20.4 kg/m.    Level of care: Med-Surg DVT prophylaxis:  Lovenox  Code Status: DNR - confirmed with family Family Communication: I spoke with Suzanne Fox (niece). [phone] Disposition Plan:  The patient is from: SNF    Patient is currently: ill with work up for possible pneumonia vs. UTI. Has advanced dementia.  Consults called: none  Admission status:  inpatient    Orma Flaming MD [facility]   How to contact the [facility] Attending or Consulting provider [place] or covering provider during after hours [place], for this patient?  Check the care team in [facility], [facility], [facility] and look for a) attending/consulting [facility] provider listed and b) the [facility] team listed Log into [email or web address] and use [place]'s universal password to access. If you do not have the password, please contact the hospital operator. Locate the [facility] provider you are looking for under [facility] and page to a number that you can be directly reached. If you still have difficulty reaching the provider, please page the [facility] (Director on Call) for the Hospitalists listed on amion for assistance.   09/19/2020, 4:43 PM

## 2020-09-19 NOTE — Progress Notes (Signed)
VASCULAR LAB    Right upper extremity venous duplex has been performed.  See CV proc for preliminary results.  Gave verbal report to Dr. Cecelia Byars, Tayl[place]arie Register, RVT 09/19/2020, 1:39 PM

## 2020-09-19 NOTE — ED Notes (Signed)
Unable to get pt to sign MSE form due to confusion. No family present.

## 2020-09-20 DIAGNOSIS — D72829 Elevated white blood cell count, unspecified: Secondary | ICD-10-CM

## 2020-09-20 LAB — BLOOD CULTURE ID PANEL (REFLEXED) - BCID2

## 2020-09-20 LAB — GLUCOSE, CAPILLARY
Glucose-Capillary: 115 mg/dL — ABNORMAL HIGH (ref 70–99)
Glucose-Capillary: 134 mg/dL — ABNORMAL HIGH (ref 70–99)
Glucose-Capillary: 238 mg/dL — ABNORMAL HIGH (ref 70–99)
Glucose-Capillary: 187 mg/dL — ABNORMAL HIGH (ref 70–99)

## 2020-09-20 LAB — CBC
HCT: 31.6 % — ABNORMAL LOW (ref 36.0–46.0)
Hemoglobin: 9.8 g/dL — ABNORMAL LOW (ref 12.0–15.0)
MCH: 25.7 pg — ABNORMAL LOW (ref 26.0–34.0)
MCHC: 31 g/dL (ref 30.0–36.0)
MCV: 82.7 fL (ref 80.0–100.0)
Platelets: 430 K/uL — ABNORMAL HIGH (ref 150–400)
RBC: 3.82 MIL/uL — ABNORMAL LOW (ref 3.87–5.11)
RDW: 16.4 % — ABNORMAL HIGH (ref 11.5–15.5)
WBC: 22.5 K/uL — ABNORMAL HIGH (ref 4.0–10.5)
nRBC: 0 % (ref 0.0–0.2)

## 2020-09-20 LAB — COMPREHENSIVE METABOLIC PANEL
ALT: 102 U/L — ABNORMAL HIGH (ref 0–44)
AST: 44 U/L — ABNORMAL HIGH (ref 15–41)
Albumin: 1.4 g/dL — ABNORMAL LOW (ref 3.5–5.0)
Alkaline Phosphatase: 143 U/L — ABNORMAL HIGH (ref 38–126)
Anion gap: 9 (ref 5–15)
BUN: 16 mg/dL (ref 8–23)
CO2: 25 mmol/L (ref 22–32)
Calcium: 8.5 mg/dL — ABNORMAL LOW (ref 8.9–10.3)
Chloride: 99 mmol/L (ref 98–111)
Creatinine, Ser: 0.43 mg/dL — ABNORMAL LOW (ref 0.44–1.00)
GFR, Estimated: >60 mL/min (ref >60)
Glucose, Bld: 136 mg/dL — ABNORMAL HIGH (ref 70–99)
Potassium: 4.8 mmol/L (ref 3.5–5.1)
Sodium: 133 mmol/L — ABNORMAL LOW (ref 135–145)
Total Bilirubin: 0.5 mg/dL (ref 0.3–1.2)
Total Protein: 5.6 g/dL — ABNORMAL LOW (ref 6.5–8.1)

## 2020-09-20 LAB — MAGNESIUM: Magnesium: 2 mg/dL (ref 1.7–2.4)

## 2020-09-20 MED ORDER — VANCOMYCIN HCL 1250 MG/250ML IV SOLN
1250.0000 mg | Freq: Once | INTRAVENOUS | Status: AC
  Administered 2020-09-20: 1250 mg via INTRAVENOUS
  Filled 2020-09-20: qty 250

## 2020-09-20 MED ORDER — BACLOFEN 10 MG PO TABS: 5.0000 mg | ORAL_TABLET | Freq: Every day | ORAL | Status: DC

## 2020-09-20 MED ORDER — MORPHINE SULFATE 10 MG/5ML PO SOLN
2.5000 mg | ORAL | Status: DC | PRN
  Administered 2020-09-20 (×2): 2.5 mg via ORAL
  Filled 2020-09-20 (×2): qty 2

## 2020-09-20 MED ORDER — MUPIROCIN 2 % EX OINT
TOPICAL_OINTMENT | Freq: Every day | CUTANEOUS | Status: DC
  Filled 2020-09-20 (×2): qty 22

## 2020-09-20 MED ORDER — SODIUM CHLORIDE 0.9 % IV SOLN: INTRAVENOUS | Status: DC

## 2020-09-20 MED ORDER — VANCOMYCIN HCL 750 MG/150ML IV SOLN
750.0000 mg | INTRAVENOUS | Status: DC
  Filled 2020-09-20: qty 150

## 2020-09-20 NOTE — Consult Note (Signed)
WOC Nurse Consult Note: Reason for Consult:Unstageable pressure injuries to left foot.  Heel and lateral foot. In bed with offloading boot in place.  Albumin 1.4 with noted impairment to heal.  Patient with noted dementia and yelling at each touch.  Emotional support provided.   Wound type:Nonhealing pressure injuries.  Pressure Injury POA: Yes Measurement: Left medial heel:  3 cm x 2.5 cm slough to wound bed Left lateral foot:  0.3 cm round wound with slough to wound bed.  Wound bed: slough Drainage (amount, consistency, odor) minimal serosanguinous  no odor.  Periwound:dry skin Dressing procedure/placement/frequency: Cleanse wounds to left foot and heel with NS and pat dry. Apply Mupirocin ointment to wound bed.  Cover with Foam dressing.  Change daily.  Will not follow at this time.  Please re-consult if needed.  Maple Hudson MSN, RN, FNP-BC CWON Wound, Ostomy, Continence Nurse Pager [phone]

## 2020-09-20 NOTE — Progress Notes (Signed)
PHARMACY - PHYSICIAN COMMUNICATION CRITICAL VALUE ALERT - BLOOD CULTURE IDENTIFICATION (BCID)  Suzanne Fox is an 87 y.o. female who presented to [facility] on 09/19/2020.  Assessment: 1/4 GPC BCID s. Aureus, mec a and MREJ resistance detected  Name of physician (or Provider) Contacted: Dr. Mahala Menghini  Current antibiotics: Ceftriaxone and azithromycin for PNA  Changes to prescribed antibiotics recommended: Need to start vancomycin  Recommendations accepted by provider  Results for orders placed or performed during the hospital encounter of 07/13/20  Blood Culture ID Panel (Reflexed) (Collected: 07/13/2020  9:14 PM)  Result Value Ref Range   Enterococcus faecalis NOT DETECTED NOT DETECTED   Enterococcus Faecium NOT DETECTED NOT DETECTED   Listeria monocytogenes NOT DETECTED NOT DETECTED   Staphylococcus species DETECTED (A) NOT DETECTED   Staphylococcus aureus (BCID) NOT DETECTED NOT DETECTED   Staphylococcus epidermidis DETECTED (A) NOT DETECTED   Staphylococcus lugdunensis NOT DETECTED NOT DETECTED   Streptococcus species NOT DETECTED NOT DETECTED   Streptococcus agalactiae NOT DETECTED NOT DETECTED   Streptococcus pneumoniae NOT DETECTED NOT DETECTED   Streptococcus pyogenes NOT DETECTED NOT DETECTED   A.calcoaceticus-baumannii NOT DETECTED NOT DETECTED   Bacteroides fragilis NOT DETECTED NOT DETECTED   Enterobacterales NOT DETECTED NOT DETECTED   Enterobacter cloacae complex NOT DETECTED NOT DETECTED   Escherichia coli NOT DETECTED NOT DETECTED   Klebsiella aerogenes NOT DETECTED NOT DETECTED   Klebsiella oxytoca NOT DETECTED NOT DETECTED   Klebsiella pneumoniae NOT DETECTED NOT DETECTED   Proteus species NOT DETECTED NOT DETECTED   Salmonella species NOT DETECTED NOT DETECTED   Serratia marcescens NOT DETECTED NOT DETECTED   Haemophilus influenzae NOT DETECTED NOT DETECTED   Neisseria meningitidis NOT DETECTED NOT DETECTED   Pseudomonas aeruginosa NOT DETECTED NOT DETECTED    Stenotrophomonas maltophilia NOT DETECTED NOT DETECTED   Candida albicans NOT DETECTED NOT DETECTED   Candida auris NOT DETECTED NOT DETECTED   Candida glabrata NOT DETECTED NOT DETECTED   Candida krusei NOT DETECTED NOT DETECTED   Candida parapsilosis NOT DETECTED NOT DETECTED   Candida tropicalis NOT DETECTED NOT DETECTED   Cryptococcus neoformans/gattii NOT DETECTED NOT DETECTED   Methicillin resistance mecA/C DETECTED (A) NOT DETECTED   Gerrit Halls, PharmD Clinical Pharmacist  09/20/2020  4:49 PM

## 2020-09-20 NOTE — Progress Notes (Signed)
Pharmacy Antibiotic Note  Suzanne Fox is a 87 y.o. female admitted on 09/19/2020 with abdominal pain. Patient on ertapenem prior to admission for UTI. Blood culture 1/4 GPC identified as MRSA on BCID. Pharmacy has been consulted for vancomycin dosing. Afeb. WBC 22.5. Scr at baseline. Discussed with Dr. Mahala Menghini and no treatment for pseudmonas in ucx for now and okay to stop azithromycin/ceftriaxone for PNA.  Plan: Vancomycin 1250 mg x1 loading dose  Start vancomycin 750mg  q24h (eAUC 470 using TBW for CrCl/Ke and 0.8 Scr) Monitor renal function, cultures, and clinical progression  Height: 5\' 5"  (165.1 cm) Weight: 55.6 kg (122 lb 9.2 oz) IBW/kg (Calculated) : 57  Temp (24hrs), Avg:97.9 F (36.6 C), Min:97.7 F (36.5 C), Max:98.2 F (36.8 C)  Recent Labs  Lab 09/19/20 1121 09/20/20 0252  WBC 20.9* 22.5*  CREATININE 0.50 0.43*  LATICACIDVEN 1.4  --     Estimated Creatinine Clearance: 42.7 mL/min (A) (by C-G formula based on SCr of 0.43 mg/dL (L)).    Allergies  Allergen Reactions   Aricept [Donepezil Hcl]     Per MAR   Aricept [Donepezil]     Other reaction(s): localised rash   Other Other (See Comments)   Sulfa Antibiotics     Per MAR     Antimicrobials this admission: Ceftriaxone 6/26 >> 6/27  Azithromycin 6/26 >> 6/27  Vancomycin 6/27 >>  Dose adjustments this admission:   Microbiology results: 6/26 ucx: 50k pseudomonas 6/26 bcx: 1/4 GPC, BCID MRSA  Thank you for allowing pharmacy to be a part of this patient's care.  7/26 09/20/2020 5:05 PM

## 2020-09-20 NOTE — Plan of Care (Signed)
  Problem: Education: Goal: Knowledge of General Education information will improve Description: Including pain rating scale, medication(s)/side effects and non-pharmacologic comfort measures Outcome: Not Progressing   Problem: Health Behavior/Discharge Planning: Goal: Ability to manage health-related needs will improve Outcome: Not Progressing   Problem: Clinical Measurements: Goal: Will remain free from infection Outcome: Not Progressing   Problem: Nutrition: Goal: Adequate nutrition will be maintained Outcome: Not Progressing

## 2020-09-20 NOTE — Progress Notes (Addendum)
PROGRESS NOTE   Suzanne CahillJennie D Fox  RUE:[id number]:[id number] DOB: 10/24/1929 DOA: 09/19/2020 PCP: Georgianne Fickamachandran, Ajith, MD  Brief Narrative:  [age] [address] female [facility] resident Prior VRE UTI Severe hyponatremia-(110) multifactorial-2/2 thiazide-ICU- Rx 3% saline on admit 07/13/2020 C2-C3, C3-C4, C4-C5 severe stenosis/epidural hematoma status post emergent decompression/fusion 06/10/2020 Dr. 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 Apply Mupirocin ointment to wound bed.  Cover with Foam dressing.  Change daily" Hypertension with underlying hypotensive episodes Continue midodrine twice daily [prior on florinef] De-escalate in the next several days Prior severe hyponatremia This is  currently stable-discontinue salt tablets going forward on discharge Transaminitis US ABD, CT ABD   [-] liver pathology?  Infectious versus ertapenem related Stop trending labs DM TY 2 A1c 6.8 Stop aggressive  monitor  SSI EOL discussions Niece saw her Friday--2 weeks prior was isolated --from 6/13-->last Friday had changed a lot. We discussed goals of care--I will ask CW to offer choice of Hospice/pallaitve to follow at facility We will probably de-escalate care in the next 1-2 days  Prior neck hematoma and fusion 3/17 Hypothyroid Hyperlipidemia   DVT prophylaxis: Lovenox Code Status: DNR Family Communication: Basilia JumboCheri Burgess contacted [phone] I discussed in detail with her and then spoke to the patient's Sister Bonita QuinLinda who confirms that the goal is comfort We are de-escalating her care today and discontinuing nonessential meds I have started Roxanol I will reassess her tomorrow to discuss full comfort measures and have reached out TOC to coordinate choice of either hospice or back to skilled facility in the next day or so with palliative care Disposition:  Status is: Inpatient  Remains inpatient appropriate because:Hemodynamically unstable, Ongoing active pain requiring inpatient pain management, and Altered mental status  Dispo: The patient is from: SNF              Anticipated d/c is to: SNF              Patient currently is not medically stable to d/c.   Difficult to place patient No  Consultants:  Palliative care  Procedures:   Antimicrobials:   Ceftriaxone/azithromycin  Subjective: Yelling out  Poorly coherent-canntot make any sense  Objective: Vitals:   09/20/20 0500 09/20/20 0600 09/20/20 0800 09/20/20 0913  BP: (!) 142/57 (!) 137/58 134/66 (!) 127/94  Pulse: 95 95 96 (!) 109  Resp: 16 15 16 20   Temp:    98.2 F (36.8 C)  TempSrc:  Oral  SpO2: 97% 97% 98% 95%  Weight:      Height:        Intake/Output Summary (Last 24 hours) at 09/20/2020 1445 Last  data filed at 09/19/2020 1640 Gross per 24 hour  Intake 350 ml  Output --  Net 350 ml   Filed Weights   09/19/20 1032  Weight: 55.6 kg    Examination:  Frail black female bitemporal wasting-she is crying out and will not allow significant exam Cta b no rales no rhonchi no wheeze Abdomen soft   Data Reviewed: personally reviewed   CBC    Component Value Date/Time   WBC 22.5 (H) 09/20/2020 0252   RBC 3.82 (L) 09/20/2020 0252   HGB 9.8 (L) 09/20/2020 0252   HCT 31.6 (L) 09/20/2020 0252   PLT 430 (H) 09/20/2020 0252   MCV 82.7 09/20/2020 0252   MCH 25.7 (L) 09/20/2020 0252   MCHC 31.0 09/20/2020 0252   RDW 16.4 (H) 09/20/2020 0252   LYMPHSABS 0.8 09/19/2020 1121   MONOABS 1.2 (H) 09/19/2020 1121   EOSABS 0.1 09/19/2020 1121   BASOSABS 0.1 09/19/2020 1121   CMP Latest Ref Rng & Units 09/20/2020 09/19/2020 08/04/2020  Glucose 70 - 99 mg/dL 035(W) 656(C) 127(N)  BUN 8 - 23 mg/dL 16 17 10   Creatinine 0.44 - 1.00 mg/dL ) 1.70(Y 1.74  Sodium 135 - 145 mmol/L 133(L) 132(L) 135  Potassium 3.5 - 5.1 mmol/L 4.8 5.2(H) 3.6  Chloride 98 - 111 mmol/L 99 96(L) 101  CO2 22 - 32 mmol/L 25 28 27   Calcium 8.9 - 10.3 mg/dL 9.44) 9.1 9.1  Total Protein 6.5 - 8.1 g/dL ) 6.5 -  Total Bilirubin 0.3 - 1.2 mg/dL 0.5 0.8 -  Alkaline Phos 38 - 126 U/L 143(H) 184(H) -  AST 15 - 41 U/L 44(H) 71(H) -  ALT 0 - 44 U/L 102(H) 146(H) -     Radiology Studies: DG Chest 1 View  Result Date: 09/19/2020 CLINICAL DATA:  Confused patient. No known injury. Patient uncooperative for exam. EXAM: CHEST  1 VIEW COMPARISON:  06/10/2020. FINDINGS: Hazy opacity is noted at the left lung base obscuring the hemidiaphragm, new since the prior exam. Remainder of the lungs is clear. Cardiac silhouette is normal in size. Dense aortic atherosclerotic calcifications. No mediastinal or hilar masses. No pneumothorax. Skeletal structures are demineralized. There are arthropathic changes of the shoulders, most severe  on the right. IMPRESSION: 1. New opacity at the left lung base consistent with a combination of pleural fluid and atelectasis or pneumonia. 2. No other evidence of an acute abnormality.  No pulmonary edema. Electronically Signed   By: 09/21/2020 M.D.   On: 09/19/2020 12:18   DG Wrist Complete Right  Result Date: 09/19/2020 CLINICAL DATA:  Confused patient. No known injury. Patient uncooperative for exam. EXAM: RIGHT WRIST - COMPLETE 3+ VIEW COMPARISON:  None. FINDINGS: No fracture.  No bone lesion. Joints normally spaced and aligned. Mild surrounding soft tissue swelling suggested. IMPRESSION: No fracture or dislocation. Electronically Signed   By: 09/21/2020 M.D.   On: 09/19/2020 12:17   CT ABDOMEN PELVIS W CONTRAST  Result Date: 09/19/2020 CLINICAL DATA:  Abdominal pain. EXAM: CT ABDOMEN AND PELVIS WITH CONTRAST TECHNIQUE: Multidetector CT imaging of the abdomen and pelvis was performed using the standard protocol following bolus administration of intravenous contrast. CONTRAST:  09/21/2020 OMNIPAQUE IOHEXOL 300 MG/ML  SOLN COMPARISON:  None. FINDINGS: Lower chest: Small left and trace right pleural effusions with left  greater than right lower lobe atelectasis. Hepatobiliary: No focal liver abnormality is seen. No gallstones, gallbladder wall thickening, or biliary dilatation. Pancreas: Unremarkable. No pancreatic ductal dilatation or surrounding inflammatory changes. Spleen: Normal in size without focal abnormality. Adrenals/Urinary Tract: Bilateral adrenal gland thickening. Small bilateral renal cysts. No renal calculi or hydronephrosis. The bladder is decompressed by Foley catheter. Stomach/Bowel: Stomach is within normal limits. Diminutive or absent appendix. No evidence of bowel wall thickening, distention, or inflammatory changes. Vascular/Lymphatic: Aortic atherosclerosis. No enlarged abdominal or pelvic lymph nodes. Reproductive: Status post hysterectomy. No adnexal masses. Other: No abdominal  wall hernia or abnormality. No abdominopelvic ascites. No pneumoperitoneum. Musculoskeletal: No acute or significant osseous findings. IMPRESSION: 1. No acute intra-abdominal process. 2. Small left and trace right pleural effusions with left greater than right lower lobe atelectasis. 3. Aortic Atherosclerosis (ICD10-I70.0). Electronically Signed   By: Obie Dredge M.D.   On: 09/19/2020 13:32   DG Hand Complete Right  Result Date: 09/19/2020 CLINICAL DATA:  Confused patient. No known injury. Patient uncooperative for exam. EXAM: RIGHT HAND - COMPLETE 3+ VIEW COMPARISON:  None. FINDINGS: No fracture or bone lesion.  Skeletal structures are demineralized. Joints are normally aligned. Mild soft tissue swelling dorsally. IMPRESSION: No fracture or dislocation. Electronically Signed   By: Amie Portland M.D.   On: 09/19/2020 12:19   VAS [place] UPPER EXTREMITY VENOUS DUPLEX  Result Date: 09/19/2020 UPPER VENOUS STUDY  Patient Name:  JOYIA RIEHLE  Date of Exam:   09/19/2020 Medical Rec #: [id number]      Accession #:    [id number] Date of Birth: 11/23/1929      Patient Gender: F Patient Age:   89Y Exam Location:  [facility] Procedure:      VAS [place] UPPER EXTREMITY VENOUS DUPLEX Referring Phys: [id number] Canary Brim TEGELER --------------------------------------------------------------------------------  Indications: Pain Limitations: Non cooperative patient and body habitus. Comparison Study: Prior negative Right UE venous studies done 08/02/20 and 07/14/20 Performing Technologist: Sherren Kerns RVS  Examination Guidelines: A complete evaluation includes B-mode imaging, spectral Doppler, color Doppler, and power Doppler as needed of all accessible portions of each vessel. Bilateral testing is considered an integral part of a complete examination. Limited examinations for reoccurring indications may be performed as noted.  Right Findings: +----------+------------+---------+-----------+----------+---------------+  RIGHT     CompressiblePhasicitySpontaneousProperties    Summary     +----------+------------+---------+-----------+----------+---------------+ IJV                                                 Not visualized  +----------+------------+---------+-----------+----------+---------------+ Subclavian               Yes       Yes                              +----------+------------+---------+-----------+----------+---------------+ Axillary                 Yes       Yes                              +----------+------------+---------+-----------+----------+---------------+ Brachial      Full                                                  +----------+------------+---------+-----------+----------+---------------+  Radial                                              patent by color +----------+------------+---------+-----------+----------+---------------+ Ulnar                                               Not visualized  +----------+------------+---------+-----------+----------+---------------+ Cephalic      Full                                                  +----------+------------+---------+-----------+----------+---------------+ Basilic       Full                                                  +----------+------------+---------+-----------+----------+---------------+  Left Findings: +----------+------------+---------+-----------+----------+-------+ LEFT      CompressiblePhasicitySpontaneousPropertiesSummary +----------+------------+---------+-----------+----------+-------+ Subclavian               Yes       Yes                      +----------+------------+---------+-----------+----------+-------+  Summary:  Right: No obvious evidence of deep vein thrombosis in the visualized veins of the upper extremity. No evidence of superficial vein thrombosis in the upper extremity.  Left: No evidence of thrombosis in the subclavian.  *See table(s)  above for measurements and observations.     Preliminary    US Abdomen Limited RUQ (LIVER/GB)  Result Date: 09/19/2020 CLINICAL DATA:  Elevated LFTs EXAM: ULTRASOUND ABDOMEN LIMITED RIGHT UPPER QUADRANT COMPARISON:  CT scan September 19, 2020 FINDINGS: Gallbladder: No gallstones or wall thickening visualized. No sonographic Murphy sign noted by sonographer. Common bile duct: Diameter: 3.9 mm Liver: No focal lesion identified. Within normal limits in parenchymal echogenicity. Portal vein is patent on color Doppler imaging with normal direction of blood flow towards the liver. Other: Small right pleural effusion. IMPRESSION: 1. A tiny right pleural effusion is identified. 2. No other abnormalities are identified. The study is limited due to patient immobility and pain. Electronically Signed   By: Gerome Sam III M.D   On: 09/19/2020 14:53     Scheduled Meds:  memantine  10 mg Oral q morning   mupirocin ointment   Topical Daily   Continuous Infusions:  azithromycin 250 mg (09/20/20 1100)   cefTRIAXone (ROCEPHIN)  IV 1 g (09/20/20 1041)     LOS: 1 day   Time spent: 46  Rhetta Mura, MD [facility] To contact the attending provider between 7A-7P or the covering provider during after hours 7P-7A, please log into the web site [email or web address] and access using universal [place] password for that web site. If you do not have the password, please call the hospital operator.  09/20/2020, 2:45 PM

## 2020-09-20 NOTE — Hospital Course (Signed)
56 W female [facility] resident Prior VRE UTI Severe hyponatremia-(110) multifactorial-not supposed to be on thiazide Rx 3% saline on admit 07/13/2020 C2-C3, C3-C4, C4-C5 severe stenosis/epidural hematoma status post emergent decompression/fusion 06/10/2020 Dr. 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

## 2020-09-20 NOTE — ED Notes (Signed)
Breakfast order placed ?

## 2020-09-21 ENCOUNTER — Inpatient Hospital Stay (HOSPITAL_COMMUNITY): Payer: Medicare PPO

## 2020-09-21 DIAGNOSIS — L89523 Pressure ulcer of left ankle, stage 3: Secondary | ICD-10-CM

## 2020-09-21 DIAGNOSIS — R7881 Bacteremia: Secondary | ICD-10-CM

## 2020-09-21 DIAGNOSIS — F0390 Unspecified dementia without behavioral disturbance: Secondary | ICD-10-CM

## 2020-09-21 DIAGNOSIS — Z66 Do not resuscitate: Secondary | ICD-10-CM

## 2020-09-21 DIAGNOSIS — Z515 Encounter for palliative care: Secondary | ICD-10-CM

## 2020-09-21 DIAGNOSIS — R0600 Dyspnea, unspecified: Secondary | ICD-10-CM

## 2020-09-21 DIAGNOSIS — B9562 Methicillin resistant Staphylococcus aureus infection as the cause of diseases classified elsewhere: Secondary | ICD-10-CM

## 2020-09-21 LAB — GLUCOSE, CAPILLARY
Glucose-Capillary: 188 mg/dL — ABNORMAL HIGH (ref 70–99)
Glucose-Capillary: 179 mg/dL — ABNORMAL HIGH (ref 70–99)
Glucose-Capillary: 200 mg/dL — ABNORMAL HIGH (ref 70–99)

## 2020-09-21 LAB — ECHOCARDIOGRAM COMPLETE
Area-P 1/2: 2.19 cm2
Height: 65 in
S' Lateral: 2.3 cm
Weight: 1961.21 oz

## 2020-09-21 MED ORDER — SODIUM CHLORIDE 0.9 % IV SOLN
3.0000 g | Freq: Three times a day (TID) | INTRAVENOUS | Status: DC
  Filled 2020-09-21 (×2): qty 8

## 2020-09-21 MED ORDER — LORAZEPAM 0.5 MG PO TABS
0.5000 mg | ORAL_TABLET | ORAL | Status: DC | PRN
  Administered 2020-09-22: 0.5 mg via ORAL
  Filled 2020-09-21: qty 1

## 2020-09-21 MED ORDER — MORPHINE SULFATE 10 MG/5ML PO SOLN
5.0000 mg | ORAL | Status: DC | PRN
  Administered 2020-09-21 – 2020-09-22 (×5): 5 mg via ORAL
  Filled 2020-09-21 (×5): qty 4

## 2020-09-21 NOTE — Consult Note (Signed)
Consultation Note Date: 09/21/2020   Patient Name: Suzanne Fox  DOB: 08-31-1929  MRN: [id number]  Age / Sex: 87 y.o., female  PCP: Suzanne Fick, MD Referring Physician: Rhetta Mura, MD  Reason for Consultation: Establishing goals of care and Psychosocial/spiritual support  HPI/Patient Profile: 87 y.o. female   admitted on 09/19/2020 with  past medical history significant of DM2, HLD, HTN, ACD, hypothyroidism, hyponatremia and dementia presented to ED  for abnormal lab work. She is currently a resident of [facility].    Patient was admitted from facility with elevated WBC and liver labs.  Today is day 2 of the hospitalization.  Patient has worsening leukocytosis cough and generalized discomfort. [name] has recently been treated for UTI with ertapenem with previous urine cultures from 5/5 with vancomycin resistant enterococcus. Chest x-ray with new opacity at the left lung base consistent with pleural effusion and atelectasis or pneumonia.  Blood cultures are now positive for MRSA.   Baseline is bed bound and total care at baseline.   Patient does not have medical decisional capacity    Her niece/ Suzanne Fox and sister/ Suzanne Fox are main support and decision makers    Clinical Assessment and Goals of Care:  This NP Suzanne Fox reviewed medical records, received report from team, assessed the patient and then spoke to family by phone from  patient's bedside  to discuss diagnosis, prognosis, GOC, EOL wishes disposition and options.   Concept of Palliative Care was introduced as specialized medical care for people and their families living with serious illness.  If focuses on providing relief from the symptoms and stress of a serious illness.  The goal is to improve quality of life for both the patient and the family.  Values and goals of care important to patient and  family were attempted to be elicited.  Created space and opportunity for patient  and family to explore thoughts and feelings regarding current medical situation.  Patient's sister/ Suzanne Fox deferred decisions to her niece Suzanne Fox.  In speaking to Suzanne she verbalizes an understanding of the patient's continued physical, functional and cognitive decline over the past many months.  She understands that patient is not going to rebound and will continue to decline, even within the context of full medical support.  She understands the long-term poor prognosis,   she verbalizes that she does not want to see her aunt suffer and hopes for comfort at this time in her life.  Patient's niece shares her love and appreciation for her and.  [name] is a twin who is married but never had children.  She was "a kind individual" who loved crossword puzzles, gardening and was an excellent cook.  [name] will be buried in the family cemetery in [place] at their family church which is now a historical location and cemetery.  A  discussion was had today regarding advanced directives.  Concepts specific to code status, artifical feeding and hydration, continued IV antibiotics and rehospitalization was had.  The difference between a aggressive medical intervention path  and a palliative comfort care path for this patient at this time was had.      Natural trajectory and expectations at EOL were discussed.  Education offered the likely prognosis for this patient, foregoing life-prolonging measures and allowing a natural death is likely less than a few weeks.  Education offered that anything could happen at any time.   Questions and concerns addressed.  Patient  encouraged to call with questions or concerns.     PMT will continue to support holistically.     No documented H POA.  Patient's sister and niece are can unison to make decisions for this patient.  Suzanne Fox is the main contact     SUMMARY OF RECOMMENDATIONS    Code Status/Advance Care Planning: DNR   Symptom Management:     Pain/dyspnea: Roxanol 5 mg p.o./sublingual every 2 hours as needed     Anxiety/agitation: Ativan 1 mg p.o./sublingual every 6 hours as needed  Palliative Prophylaxis:  Aspiration, Bowel Regimen, Frequent Pain Assessment, Oral Care, and Palliative Wound Care  Additional Recommendations (Limitations, Scope, Preferences): Full Comfort Care  Psycho-social/Spiritual:  Desire for further Chaplaincy support:no- declined, family support Additional Recommendations: Education on Hospice and Grief/Bereavement Support  Prognosis:  < 2 weeks  Discharge Planning:   Family is hopeful for residential hospice for end-of-life care.  Hospice facility      Primary Diagnoses: Present on Admission:  Dementia without behavioral disturbance (HCC)  Hypothyroidism (acquired)  Hypertension  Hyperlipidemia  Pneumonia  Elevated WBC count  COVID-19 virus infection  Pressure ulcer of ankle  Transaminitis  Edema of right upper extremity  Iron deficiency anemia  Malnutrition of moderate degree  Hyponatremia   I have reviewed the medical record, interviewed the patient and family, and examined the patient. The following aspects are pertinent.  Past Medical History:  Diagnosis Date   Adult failure to thrive    Anemia in other chronic diseases classified elsewhere    Arthritis    Atherosclerosis of aorta (HCC)    COVID-19    Dementia without behavioral disturbance (HCC) 06/10/2020   Diabetes mellitus    Dysphagia, oropharyngeal phase    Heart murmur    Hiatal hernia    Hyperlipidemia    Hypertension    Hypothyroidism (acquired) 06/10/2020   Moderate protein-calorie malnutrition (HCC)    Muscle weakness (generalized)    Need for assistance with personal care    Nontraumatic extradural hemorrhage (HCC)    Other muscle spasm    Other reduced mobility    Other retention of urine     Peripheral vascular disease (HCC)    Spinal stenosis in cervical region    Unilateral primary osteoarthritis, right knee    Social History   Socioeconomic History   Marital status: Widowed    Spouse name: Not on file   Number of children: 0   Years of education: 6812   Highest education level: Not on file  Occupational History   Occupation: Retired    Associate Professormployer: RETIRED  Tobacco Use   Smoking status: Former    Packs/day: 1.00    Years: 50.00    Pack years: 50.00    Types: Cigarettes    Quit date: 08/27/2004    Years since quitting: 16.0   Smokeless tobacco: Never  Substance and Sexual Activity   Alcohol use: No   Drug use: No   Sexual activity: Not on file  Other Topics Concern   Not  on file  Social History Narrative   ** Merged History Encounter **       Patient lives at home alone.   Caffeine use: 4 cups daily   Social Determinants of Health   Financial Resource Strain: Not on file  Food Insecurity: Not on file  Transportation Needs: Not on file  Physical Activity: Not on file  Stress: Not on file  Social Connections: Not on file   Family History  Problem Relation Age of Onset   Other Mother        At childbirth   Prostate cancer Father    Cancer Maternal Aunt    Asthma Sister    Diabetes Sister    Diabetes Sister    Diabetes Sister    Scheduled Meds:  memantine  10 mg Oral q morning   mupirocin ointment   Topical Daily   Continuous Infusions:  sodium chloride 50 mL/hr at 09/20/20 1544   ampicillin-sulbactam (UNASYN) IV     vancomycin     PRN Meds:.guaiFENesin-dextromethorphan, morphine Medications Prior to Admission:  Prior to Admission medications   Medication Sig Start Date End Date Taking? Authorizing Provider  acetaminophen (TYLENOL) 325 MG tablet Take 650 mg by mouth every 4 (four) hours as needed for mild pain.   Yes [provider]  Baclofen 5 MG TABS Take 5 mg by mouth in the morning, at noon, and at bedtime.   Yes [provider]  collagenase (SANTYL) ointment Apply 1 application topically daily. Apply to left heel   Yes [provider]  diclofenac Sodium (VOLTAREN) 1 % GEL Apply 2 g topically 4 (four) times daily. 08/06/20  Yes Ghimire, Werner Lean, MD  ERTAPENEM SODIUM IJ Inject 1,000 mg into the muscle daily. X 7 days 09/13/20  Yes [provider]  feeding supplement (ENSURE ENLIVE / ENSURE PLUS) LIQD Take 237 mLs by mouth 2 (two) times daily between meals. 08/06/20  Yes Ghimire, Werner Lean, MD  guaiFENesin-dextromethorphan (ROBITUSSIN DM) 100-10 MG/5ML syrup Take 10 mLs by mouth every 4 (four) hours as needed for cough.   Yes [provider]  levothyroxine (SYNTHROID) 25 MCG tablet Take 25 mcg by mouth daily before breakfast.   Yes [provider]  magnesium oxide (MAG-OX) 400 MG tablet Take 400 mg by mouth 2 (two) times daily.   Yes [provider]  memantine (NAMENDA) 10 MG tablet Take 10 mg by mouth every morning. 12/09/19  Yes [provider]  midodrine (PROAMATINE) 2.5 MG tablet Take 1 tablet (2.5 mg total) by mouth 2 (two) times daily with a meal. 08/06/20  Yes Ghimire, Werner Lean, MD  Nystatin (GERHARDT'S BUTT CREAM) CREA Apply 1 application topically daily. 07/26/20  Yes Suzanne Mura, MD  povidone-Iodine (BETADINE) 5 % SOLN topical solution Apply 1 application topically daily. Apply to pressure wound left knee   Yes [provider]  rOPINIRole (REQUIP) 0.25 MG tablet Take 0.25 mg by mouth at bedtime.   Yes [provider]  tamsulosin (FLOMAX) 0.4 MG CAPS capsule Take 1 capsule (0.4 mg total) by mouth daily after supper. 08/06/20  Yes Ghimire, Werner Lean, MD  traMADol (ULTRAM) 50 MG tablet Take 50 mg by mouth every 8 (eight) hours as needed for moderate pain.   Yes [provider]  Wound Dressings ([employer] MANUKA HONEY WOUND) GEL Apply 1 application topically daily.   Yes [provider]  methocarbamol (ROBAXIN) 500 MG  tablet Take 1 tablet (500 mg total) by mouth every 6 (  six) hours as needed for muscle spasms. Patient not taking: No sig reported 06/18/20   Cipriano Bunker, MD  sodium chloride 1 g tablet Take 1 tablet (1 g total) by mouth 2 (two) times daily. Patient not taking: No sig reported 06/18/20   Cipriano Bunker, MD   Allergies  Allergen Reactions   Aricept Palma Holter Hcl]     Per [facility]   Aricept [Donepezil]     Other reaction(s): localised rash   Other Other (See Comments)   Sulfa Antibiotics     Per MAR    Review of Systems  Unable to perform ROS: Dementia   Physical Exam Constitutional:      Appearance: She is cachectic. She is ill-appearing.  Cardiovascular:     Rate and Rhythm: Tachycardia present.  Pulmonary:     Breath sounds: Decreased breath sounds present.  Musculoskeletal:     Comments: Generalized muscle atrophy and weakness  Skin:    General: Skin is warm and dry.  Neurological:     Mental Status: She is lethargic.    Vital Signs: BP 140/62 (BP Location: Right Arm)   Pulse (!) 110   Temp (!) 100.4 F (38 C)   Resp 16   Ht 5\' 5"  (1.651 m)   Wt 55.6 kg   SpO2 99%   BMI 20.40 kg/m  Pain Scale: 0-10   Pain Score: 2    SpO2: SpO2: 99 % O2 Device:SpO2: 99 % O2 Flow Rate: .   IO: Intake/output summary:  Intake/Output Summary (Last 24 hours) at 09/21/2020 09/23/2020 Last data filed at 09/21/2020 09/23/2020 Gross per 24 hour  Intake 544.59 ml  Output 700 ml  Net -155.41 ml    LBM:   Baseline Weight: Weight: 55.6 kg Most recent weight: Weight: 55.6 kg     Palliative Assessment/Data: 20 % at best   Discussed with Dr [id number]  Time In: 11:00 Time Out: 12:10 Time Total: 70 minutes Greater than 50%  of this time was spent counseling and coordinating care related to the above assessment and plan.  Signed by: Suzanne Menghini, NP   Please contact Palliative Medicine Team phone at [phone] for questions and concerns.  For individual provider: See  [phone]

## 2020-09-21 NOTE — Plan of Care (Signed)
  Problem: Nutrition: Goal: Adequate nutrition will be maintained Outcome: Progressing   

## 2020-09-21 NOTE — Progress Notes (Addendum)
Found PROGRESS NOTE   Suzanne Fox  EAV:[id number] DOB: Feb 26, 1930 DOA: 09/19/2020 PCP: Georgianne Fick, MD  Brief Narrative:  21 W female [facility] resident Prior VRE UTI Severe hyponatremia-(110) multifactorial-2/2 thiazide-ICU- Rx 3% saline on admit 07/13/2020 C2-C3, C3-C4, C4-C5 severe stenosis/epidural hematoma status post emergent decompression/fusion 06/10/2020 Dr. 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  LLL pneumonia  -start Unasyn 2/2  low-grade fevers overnight 6/27--family still contemplating goals of care--I am okay with low-dose morphine at this time Continue NS 50 cc/H at this time for now- Stop tramadol, will cut back baclofen and discontinue Requip Mentation has improved significantly see below--at baseline she is somewhat interactive but not coherent completely MRSA bacteremia 1/4 bottles MRSA An echo, BC X2 on 6/28 was ordered by ID which will be followed Urinary retention with chronic Foley-colonized Pseudomonas aeruginosa, Enterococcus faecalis Outpatient urology follow-up not required-requires catheter because of risk for skin breakdown Pressure wound left foot and heel medially and laterally Appreciate wound nurse input "Dressing procedure/placement/frequency: Cleanse wounds to left foot and heel with NS and pat dry. Apply Mupirocin ointment  to wound bed.  Cover with Foam dressing.  Change daily" Hold imaging of left foot for now as it would not change management with bacteremia and Unasyn at this time If goals of care as per discussion below are different we will consider MRI Get CRP with next labs Hypertension with underlying hypotensive episodes Continue midodrine twice daily [prior on florinef] -Stable Prior severe hyponatremia This is currently stable-discontinue salt tablets going forward on discharge Transaminitis [place] ABD, CT ABD   [-] liver pathology?  Infectious versus ertapenem related Cmet am until goals clearer DM TY 2 A1c 6.8 Stop aggressive  monitor  [facility] EOL discussions Niece saw her Friday--2 weeks prior was isolated --from 6/13-->last Friday had changed a lot. We discussed goals of care--please see below Prior neck hematoma and fusion 3/17 Hypothyroid Hyperlipidemia   DVT prophylaxis: Lovenox Code Status: DNR Family Communication: Basilia Jumbo contacted [phone] Further discussion with Cordelia Pen after me telling her that patient is more awake and alert reveals hesitance and making full DNR and comfort care trajectory decisions at this time I have asked palliative care to reach out as I think patient's prognosis is quite poor regardless and she is at risk for PT hospitalizations however we did agree that it is very reasonable to treat possible pneumonia and bacteremia As such we will get labs in the morning I will repeat x-ray to confirm or refute pneumonia and we will get a CRP to determine if there is any bony involvement of her heels causing the bacteremia--this would not change overall poor prognosis   Disposition:  Status is: Inpatient  Remains inpatient appropriate because:Hemodynamically unstable, Ongoing active pain requiring inpatient pain management, and Altered mental status  Dispo: The patient is from: SNF              Anticipated d/c is to: SNF  Patient currently is not  medically stable to d/c.   Difficult to place patient No  Consultants:  Palliative care  Procedures:   Antimicrobials:   Ceftriaxone/azithromycin  Subjective:  More coherent Tells me she is in the hospital Cannot tell me the president date time or year however She seems less agitated than previously She has no overt pain but her review of systems is unreliable   Objective: Vitals:   09/20/20 1555 09/20/20 2119 09/21/20 0213 09/21/20 0623  BP: 133/80 (!) 154/90  140/62  Pulse: 100 (!) 107  (!) 110  Resp: 20 18  16   Temp: 97.8 F (36.6 C) (!) 100.4 F (38 C) 99.7 F (37.6 C) (!) 100.4 F (38 C)  TempSrc: Oral Oral Oral   SpO2: 99% 99%  99%  Weight:      Height:        Intake/Output Summary (Last 24 hours) at 09/21/2020 1143 Last data filed at 09/21/2020 [id number] Gross per 24 hour  Intake 544.59 ml  Output 700 ml  Net -155.41 ml    Filed Weights   09/19/20 1032  Weight: 55.6 kg    Examination:  Frail black female bitemporal wasting No rhonchi no wheeze Abdomen soft without rebound or guarding I did not examine her heels today Moving 4 limbs without gross deficit Quite cachectic She is intelligible more so today than she was yesterday but not very coherent and I would say she is oriented x2   Data Reviewed: personally reviewed   CBC    Component Value Date/Time   WBC 22.5 (H) 09/20/2020 0252   RBC 3.82 (L) 09/20/2020 0252   HGB 9.8 (L) 09/20/2020 0252   HCT 31.6 (L) 09/20/2020 0252   PLT 430 (H) 09/20/2020 0252   MCV 82.7 09/20/2020 0252   MCH 25.7 (L) 09/20/2020 0252   MCHC 31.0 09/20/2020 0252   RDW 16.4 (H) 09/20/2020 0252   LYMPHSABS 0.8 09/19/2020 1121   MONOABS 1.2 (H) 09/19/2020 1121   EOSABS 0.1 09/19/2020 1121   BASOSABS 0.1 09/19/2020 1121   CMP Latest Ref Rng & Units 09/20/2020 09/19/2020 08/04/2020  Glucose 70 - 99 mg/dL 782(N136(H) 562(Z177(H) 308(M174(H)  BUN 8 - 23 mg/dL 16 17 10   Creatinine 0.44 - 1.00 mg/dL 5.78(I0.43(L) 6.960.50 2.950.44  Sodium 135 - 145  mmol/L 133(L) 132(L) 135  Potassium 3.5 - 5.1 mmol/L 4.8 5.2(H) 3.6  Chloride 98 - 111 mmol/L 99 96(L) 101  CO2 22 - 32 mmol/L 25 28 27   Calcium 8.9 - 10.3 mg/dL 2.8(U8.5(L) 9.1 9.1  Total Protein 6.5 - 8.1 g/dL 1.3(K5.6(L) 6.5 -  Total Bilirubin 0.3 - 1.2 mg/dL 0.5 0.8 -  Alkaline Phos 38 - 126 U/L 143(H) 184(H) -  AST 15 - 41 U/L 44(H) 71(H) -  ALT 0 - 44 U/L 102(H) 146(H) -     Radiology Studies: DG Chest 1 View  Result Date: 09/19/2020 CLINICAL DATA:  Confused patient. No known injury. Patient uncooperative for exam. EXAM: CHEST  1 VIEW COMPARISON:  06/10/2020. FINDINGS: Hazy opacity is noted at the left lung base obscuring the hemidiaphragm, new since the prior exam. Remainder of the lungs is clear. Cardiac silhouette is normal in size. Dense aortic atherosclerotic calcifications. No mediastinal or hilar masses. No pneumothorax. Skeletal structures are demineralized. There are arthropathic changes of the shoulders, most severe on the right. IMPRESSION: 1. New opacity at the left lung base consistent with a combination of pleural fluid and atelectasis or pneumonia. 2. No other evidence of  an acute abnormality.  No pulmonary edema. Electronically Signed   By: Amie Portland M.D.   On: 09/19/2020 12:18   DG Wrist Complete Right  Result Date: 09/19/2020 CLINICAL DATA:  Confused patient. No known injury. Patient uncooperative for exam. EXAM: RIGHT WRIST - COMPLETE 3+ VIEW COMPARISON:  None. FINDINGS: No fracture.  No bone lesion. Joints normally spaced and aligned. Mild surrounding soft tissue swelling suggested. IMPRESSION: No fracture or dislocation. Electronically Signed   By: Amie Portland M.D.   On: 09/19/2020 12:17   CT ABDOMEN PELVIS W CONTRAST  Result Date: 09/19/2020 CLINICAL DATA:  Abdominal pain. EXAM: CT ABDOMEN AND PELVIS WITH CONTRAST TECHNIQUE: Multidetector CT imaging of the abdomen and pelvis was performed using the standard protocol following bolus administration of intravenous  contrast. CONTRAST:  OMNIPAQUE IOHEXOL 300 MG/ML  SOLN COMPARISON:  None. FINDINGS: Lower chest: Small left and trace right pleural effusions with left greater than right lower lobe atelectasis. Hepatobiliary: No focal liver abnormality is seen. No gallstones, gallbladder wall thickening, or biliary dilatation. Pancreas: Unremarkable. No pancreatic ductal dilatation or surrounding inflammatory changes. Spleen: Normal in size without focal abnormality. Adrenals/Urinary Tract: Bilateral adrenal gland thickening. Small bilateral renal cysts. No renal calculi or hydronephrosis. The bladder is decompressed by Foley catheter. Stomach/Bowel: Stomach is within normal limits. Diminutive or absent appendix. No evidence of bowel wall thickening, distention, or inflammatory changes. Vascular/Lymphatic: Aortic atherosclerosis. No enlarged abdominal or pelvic lymph nodes. Reproductive: Status post hysterectomy. No adnexal masses. Other: No abdominal wall hernia or abnormality. No abdominopelvic ascites. No pneumoperitoneum. Musculoskeletal: No acute or significant osseous findings. IMPRESSION: 1. No acute intra-abdominal process. 2. Small left and trace right pleural effusions with left greater than right lower lobe atelectasis. 3. Aortic Atherosclerosis (ICD10-I70.0). Electronically Signed   By: Obie Dredge M.D.   On: 09/19/2020 13:32   DG CHEST PORT 1 VIEW  Result Date: 09/21/2020 CLINICAL DATA:  Pneumonia. EXAM: PORTABLE CHEST 1 VIEW COMPARISON:  06/10/2020. FINDINGS: Heart size normal. Left base infiltrate consistent pneumonia, slightly improved from prior exam. Effusion, improved from prior exam. No pneumothorax. Prior cervical spine fusion. Degenerative changes thoracic spine and both shoulders. IMPRESSION: Left base infiltrate consistent with pneumonia. Slight improvement from prior exam. Left pleural effusion, improved from prior exam. Electronically Signed   By: Maisie Fus  Register   On: 09/21/2020 08:46    DG Hand Complete Right  Result Date: 09/19/2020 CLINICAL DATA:  Confused patient. No known injury. Patient uncooperative for exam. EXAM: RIGHT HAND - COMPLETE 3+ VIEW COMPARISON:  None. FINDINGS: No fracture or bone lesion.  Skeletal structures are demineralized. Joints are normally aligned. Mild soft tissue swelling dorsally. IMPRESSION: No fracture or dislocation. Electronically Signed   By: Amie Portland M.D.   On: 09/19/2020 12:19   VAS [place] UPPER EXTREMITY VENOUS DUPLEX  Result Date: 09/20/2020 UPPER VENOUS STUDY  Patient Name:  BRENA WINDSOR  Date of Exam:   09/19/2020 Medical Rec #: [id number]      Accession #:    [id number] Date of Birth: 10-Sep-1929      Patient Gender: F Patient Age:   89Y Exam Location:  [facility] Procedure:      VAS [place] UPPER EXTREMITY VENOUS DUPLEX Referring Phys: [id number] Canary Brim TEGELER --------------------------------------------------------------------------------  Indications: Pain Limitations: Non cooperative patient and body habitus. Comparison Study: Prior negative Right UE venous studies done 08/02/20 and 07/14/20 Performing Technologist: Sherren Kerns RVS  Examination Guidelines: A complete evaluation includes B-mode imaging, spectral Doppler, color Doppler,  and power Doppler as needed of all accessible portions of each vessel. Bilateral testing is considered an integral part of a complete examination. Limited examinations for reoccurring indications may be performed as noted.  Right Findings: +----------+------------+---------+-----------+----------+---------------+ RIGHT     CompressiblePhasicitySpontaneousProperties    Summary     +----------+------------+---------+-----------+----------+---------------+ IJV                                                 Not visualized  +----------+------------+---------+-----------+----------+---------------+ Subclavian               Yes       Yes                               +----------+------------+---------+-----------+----------+---------------+ Axillary                 Yes       Yes                              +----------+------------+---------+-----------+----------+---------------+ Brachial      Full                                                  +----------+------------+---------+-----------+----------+---------------+ Radial                                              patent by color +----------+------------+---------+-----------+----------+---------------+ Ulnar                                               Not visualized  +----------+------------+---------+-----------+----------+---------------+ Cephalic      Full                                                  +----------+------------+---------+-----------+----------+---------------+ Basilic       Full                                                  +----------+------------+---------+-----------+----------+---------------+  Left Findings: +----------+------------+---------+-----------+----------+-------+ LEFT      CompressiblePhasicitySpontaneousPropertiesSummary +----------+------------+---------+-----------+----------+-------+ Subclavian               Yes       Yes                      +----------+------------+---------+-----------+----------+-------+  Summary:  Right: No obvious evidence of deep vein thrombosis in the visualized veins of the upper extremity. No evidence of superficial vein thrombosis in the upper extremity.  Left: No evidence of thrombosis in the subclavian.  *See table(s) above for measurements and observations.  Diagnosing physician: Fabienne Bruns MD Electronically signed by Leonette Most  Fields MD on 09/20/2020 at 7:07:56 PM.    Final    US Abdomen Limited RUQ (LIVER/GB)  Result Date: 09/19/2020 CLINICAL DATA:  Elevated LFTs EXAM: ULTRASOUND ABDOMEN LIMITED RIGHT UPPER QUADRANT COMPARISON:  CT scan September 19, 2020 FINDINGS: Gallbladder: No  gallstones or wall thickening visualized. No sonographic Murphy sign noted by sonographer. Common bile duct: Diameter: 3.9 mm Liver: No focal lesion identified. Within normal limits in parenchymal echogenicity. Portal vein is patent on color Doppler imaging with normal direction of blood flow towards the liver. Other: Small right pleural effusion. IMPRESSION: 1. A tiny right pleural effusion is identified. 2. No other abnormalities are identified. The study is limited due to patient immobility and pain. Electronically Signed   By: Gerome Sam III M.D   On: 09/19/2020 14:53     Scheduled Meds:  memantine  10 mg Oral q morning   mupirocin ointment   Topical Daily   Continuous Infusions:  sodium chloride 50 mL/hr at 09/20/20 1544   vancomycin       LOS: 2 days   Time spent: 74  Rhetta Mura, MD [facility] To contact the attending provider between 7A-7P or the covering provider during after hours 7P-7A, please log into the web site [email or web address] and access using universal [place] password for that web site. If you do not have the password, please call the hospital operator.  09/21/2020, 11:43 AM

## 2020-09-21 NOTE — Plan of Care (Signed)
  Problem: Education: Goal: Knowledge of General Education information will improve Description: Including pain rating scale, medication(s)/side effects and non-pharmacologic comfort measures Outcome: Not Progressing   

## 2020-09-21 NOTE — Plan of Care (Signed)
  Problem: Health Behavior/Discharge Planning: Goal: Ability to manage health-related needs will improve Outcome: Progressing   Problem: Clinical Measurements: Goal: Will remain free from infection Outcome: Progressing   Problem: Nutrition: Goal: Adequate nutrition will be maintained Outcome: Progressing   

## 2020-09-21 NOTE — Progress Notes (Signed)
[facility] ([facility] Hospital Liaison note.    Received request from TOC manager for family interest in [facility]. [facility] is unable to offer a room today. Hospital Liaison will follow up tomorrow or sooner if a room becomes available and eligibility is confirmed.   Please do not hesitate to call with questions.    Thank you,   Mary Anne Robertson, RN, CCM      [facility] Hospital Liaison   [phone] 

## 2020-09-21 NOTE — Consult Note (Signed)
[facility]    Date of Admission:  09/19/2020     Total days of antibiotics                Reason for Consult: MRSA bacteremia   Referring Provider: [employer] Primary Care Provider: Georgianne Fick, MD   ASSESSMENT:  Suzanne Fox is an 87 y/o female admitted elevated LFTs and leukocytosis and found to have MRSA bacteremia. Source of the infection is unclear at present. She has foley cathter for urinary retention and cultures were growing Pseudomonas likely representing a colonization. Small unstageable pressure ulcer to left heel with scant drainage may be a source of infection. Does not appear that she has pneumonia and will discontinue Unasyn. Plan for TTE and repeat blood cultures. Continue with vancomycin and monitor renal function and vancomycin levels per protocol. Will await Palliative Care consult to determine agressiveness of treatment. Dementia and remaining care per primary team.   PLAN:  Continue vancomycin. TTE to check for endocarditis. Discontinue Unasyn. Repeat blood cultures in the morning. Remaining care per primary team.    Principal Problem:   MRSA bacteremia Active Problems:   Dementia without behavioral disturbance (HCC)   Hypertension   Hyperlipidemia   Hypothyroidism (acquired)   Hyponatremia   Type 2 diabetes mellitus without complication (HCC)   Malnutrition of moderate degree   Pressure ulcer of ankle   Pneumonia   Elevated WBC count   COVID-19 virus infection   Transaminitis   Edema of right upper extremity   Iron deficiency anemia    memantine  10 mg Oral q morning   mupirocin ointment   Topical Daily     HPI: Suzanne Fox is a 87 y.o. female with previous medical history of dementia, Type 2 diabetes, hypertension, coronary artery disease, and hypothyroidism admitted with worsening leukocytosis, LFT elevation, cough and generalized pain.   Suzanne Fox has recently been treated for UTI with  ertapenem with previous urine cultures from 5/5 with vancomycin resistant enterococcus. Chest x-ray with new opacity at the left lung base consistent with pleural effusion and atelectasis or pneumonia. CT abdomen/pelvis with no acute intra-abdominal process and small pleural effusions and atelectasis. US abdomen normal. Given ceftriaxone and azithromycin with concern for pneumonia. Tested positive for Covid however tested positive on 6/13 and is asymptomatic.   Suzanne Fox blood cultures are now positive for MRSA and antibiotics have been adjusted to vancomycin. Unasyn was also started with concern for aspiration pneumonia. Has had elevated temperatures in the past 24 hours up to 100.4. Suzanne Fox has chronic urinary retention and has a foley in place. Urine growing 50,000 colonies of Pseudomonas. Currently without any central lines. Palliative Care consult is in process.    Review of Systems: Review of Systems  Unable to perform ROS: Dementia    Past Medical History:  Diagnosis Date   Adult failure to thrive    Anemia in other chronic diseases classified elsewhere    Arthritis    Atherosclerosis of aorta (HCC)    COVID-19    Dementia without behavioral disturbance (HCC) 06/10/2020   Diabetes mellitus    Dysphagia, oropharyngeal phase    Heart murmur    Hiatal hernia    Hyperlipidemia    Hypertension    Hypothyroidism (acquired) 06/10/2020   Moderate protein-calorie malnutrition (HCC)    Muscle weakness (generalized)    Need for assistance with personal care    Nontraumatic extradural hemorrhage (HCC)    Other muscle  spasm    Other reduced mobility    Other retention of urine    Peripheral vascular disease (HCC)    Spinal stenosis in cervical region    Unilateral primary osteoarthritis, right knee     Social History   Tobacco Use   Smoking status: Former    Packs/day: 1.00    Years: 50.00    Pack years: 50.00    Types: Cigarettes    Quit date: 08/27/2004    Years since  quitting: 16.0   Smokeless tobacco: Never  Substance Use Topics   Alcohol use: No   Drug use: No    Family History  Problem Relation Age of Onset   Other Mother        At childbirth   Prostate cancer Father    Cancer Maternal Aunt    Asthma Sister    Diabetes Sister    Diabetes Sister    Diabetes Sister     Allergies  Allergen Reactions   Aricept [Donepezil Hcl]     Per MAR   Aricept [Donepezil]     Other reaction(s): localised rash   Other Other (See Comments)   Sulfa Antibiotics     Per MAR     OBJECTIVE: Blood pressure 140/62, pulse (!) 110, temperature (!) 100.4 F (38 C), resp. rate 16, height 5\' 5"  (1.651 m), weight 55.6 kg, SpO2 99 %.  Physical Exam Constitutional:      General: She is not in acute distress.    Appearance: She is well-developed.  Cardiovascular:     Rate and Rhythm: Normal rate and regular rhythm.     Heart sounds: Normal heart sounds.  Pulmonary:     Effort: Pulmonary effort is normal.     Breath sounds: Normal breath sounds.  Skin:    General: Skin is warm and dry.     Comments: Small unstageable pressure ulcer on the left heel with scant amount of drainage.  Neurological:     Mental Status: She is alert. She is disoriented.    Lab Results Lab Results  Component Value Date   WBC 22.5 (H) 09/20/2020   HGB 9.8 (L) 09/20/2020   HCT 31.6 (L) 09/20/2020   MCV 82.7 09/20/2020   PLT 430 (H) 09/20/2020    Lab Results  Component Value Date   CREATININE 0.43 (L) 09/20/2020   BUN 16 09/20/2020   NA 133 (L) 09/20/2020   K 4.8 09/20/2020   CL 99 09/20/2020   CO2 25 09/20/2020    Lab Results  Component Value Date   ALT 102 (H) 09/20/2020   AST 44 (H) 09/20/2020   ALKPHOS 143 (H) 09/20/2020   BILITOT 0.5 09/20/2020     Microbiology: Recent Results (from the past 240 hour(s))  Blood culture (routine x 2)     Status: None (Preliminary result)   Collection Time: 09/19/20 11:21 AM   Specimen: BLOOD  Result Value Ref Range Status    Specimen Description BLOOD LEFT ANTECUBITAL  Final   Special Requests   Final    BOTTLES DRAWN AEROBIC AND ANAEROBIC Blood Culture results may not be optimal due to an inadequate volume of blood received in culture bottles   Culture  Setup Time   Final    GRAM POSITIVE COCCI IN CLUSTERS ANAEROBIC BOTTLE ONLY CRITICAL VALUE NOTED.  VALUE IS CONSISTENT WITH PREVIOUSLY REPORTED AND CALLED VALUE.    Culture   Final    CULTURE REINCUBATED FOR BETTER GROWTH Performed at [facility], [address] [place], [place] [address]    Report Status PENDING  Incomplete  Blood culture (routine x 2)     Status: Abnormal (Preliminary result)   Collection Time: 09/19/20 11:26 AM   Specimen: BLOOD LEFT HAND  Result Value Ref Range Status   Specimen Description BLOOD LEFT HAND  Final   Special Requests   Final    BOTTLES DRAWN AEROBIC AND ANAEROBIC Blood Culture results may not be optimal due to an inadequate volume of blood received in culture bottles   Culture  Setup Time   Final    GRAM POSITIVE COCCI AEROBIC BOTTLE ONLY CRITICAL RESULT CALLED TO, READ BACK BY AND VERIFIED WITHArdelle Balls PHARMD 1652 09/20/20 A BROWNING    Culture (A)  Final    STAPHYLOCOCCUS AUREUS SUSCEPTIBILITIES TO FOLLOW Performed at [facility], [address] [place], [place] [address]    Report Status PENDING  Incomplete  Resp Panel by RT-PCR (Flu A&B, Covid) Nasopharyngeal Swab     Status: Abnormal   Collection Time: 09/19/20 11:26 AM   Specimen: Nasopharyngeal Swab; Nasopharyngeal(NP) swabs in vial transport medium  Result Value Ref Range Status   SARS Coronavirus 2 by RT PCR POSITIVE (A) NEGATIVE Final    Comment: RESULT CALLED TO, READ BACK BY AND VERIFIED WITH: A,BANKS @1623  09/19/20 EB (NOTE) SARS-CoV-2 target nucleic acids are DETECTED.  The SARS-CoV-2 RNA is generally detectable in upper respiratory specimens during the acute phase of infection. Positive results are indicative of the presence of  the identified virus, but do not rule out bacterial infection or co-infection with other pathogens not detected by the test. Clinical correlation with patient history and other diagnostic information is necessary to determine patient infection status. The expected result is Negative.  Fact Sheet for Patients: 09/21/20  Fact Sheet for Healthcare Providers: BloggerCourse.com  This test is not yet approved or cleared by the [employer]iousBroker.[place] FDA and  has been authorized for detection and/or diagnosis of SARS-CoV-2 by FDA under an Emergency Use Authorization (EUA).  This EUA will remain in effect (meaning this test can be used) fo r the duration of  the COVID-19 declaration under Section 564(b)(1) of the Act, 21 U.S.C. section 360bbb-3(b)(1), unless the authorization is terminated or revoked sooner.     Influenza A by PCR NEGATIVE NEGATIVE Final   Influenza B by PCR NEGATIVE NEGATIVE Final    Comment: (NOTE) The Xpert Xpress SARS-CoV-2/FLU/RSV plus assay is intended as an aid in the diagnosis of influenza from Nasopharyngeal swab specimens and should not be used as a sole basis for treatment. Nasal washings and aspirates are unacceptable for Xpert Xpress SARS-CoV-2/FLU/RSV testing.  Fact Sheet for Patients: [place]  Fact Sheet for Healthcare Providers: BloggerCourse.com  This test is not yet approved or cleared by the SeriousBroker.it FDA and has been authorized for detection and/or diagnosis of SARS-CoV-2 by FDA under an Emergency Use Authorization (EUA). This EUA will remain in effect (meaning this test can be used) for the duration of the COVID-19 declaration under Section 564(b)(1) of the Act, 21 U.S.C. section 360bbb-3(b)(1), unless the authorization is terminated or revoked.  Performed at [facility], [address], [place], [place]   Blood  Culture ID Panel (Reflexed)     Status: Abnormal   Collection Time: 09/19/20 11:26 AM  Result Value Ref Range Status   Enterococcus faecalis NOT DETECTED NOT DETECTED Final   Enterococcus Faecium NOT DETECTED NOT DETECTED Final  Listeria monocytogenes NOT DETECTED NOT DETECTED Final   Staphylococcus species DETECTED (A) NOT DETECTED Final    Comment: CRITICAL RESULT CALLED TO, READ BACK BY AND VERIFIED WITHArdelle Balls PHARMD 2010 09/20/20 A BROWNING    Staphylococcus aureus (BCID) DETECTED (A) NOT DETECTED Final    Comment: Methicillin (oxacillin)-resistant Staphylococcus aureus (MRSA). MRSA is predictably resistant to beta-lactam antibiotics (except ceftaroline). Preferred therapy is vancomycin unless clinically contraindicated. Patient requires contact precautions if  hospitalized. CRITICAL RESULT CALLED TO, READ BACK BY AND VERIFIED WITH: Ardelle Balls PHARMD 0712 09/20/20 A BROWNING    Staphylococcus epidermidis NOT DETECTED NOT DETECTED Final   Staphylococcus lugdunensis NOT DETECTED NOT DETECTED Final   Streptococcus species NOT DETECTED NOT DETECTED Final   Streptococcus agalactiae NOT DETECTED NOT DETECTED Final   Streptococcus pneumoniae NOT DETECTED NOT DETECTED Final   Streptococcus pyogenes NOT DETECTED NOT DETECTED Final   A.calcoaceticus-baumannii NOT DETECTED NOT DETECTED Final   Bacteroides fragilis NOT DETECTED NOT DETECTED Final   Enterobacterales NOT DETECTED NOT DETECTED Final   Enterobacter cloacae complex NOT DETECTED NOT DETECTED Final   Escherichia coli NOT DETECTED NOT DETECTED Final   Klebsiella aerogenes NOT DETECTED NOT DETECTED Final   Klebsiella oxytoca NOT DETECTED NOT DETECTED Final   Klebsiella pneumoniae NOT DETECTED NOT DETECTED Final   Proteus species NOT DETECTED NOT DETECTED Final   Salmonella species NOT DETECTED NOT DETECTED Final   Serratia marcescens NOT DETECTED NOT DETECTED Final   Haemophilus influenzae NOT DETECTED NOT DETECTED Final   Neisseria  meningitidis NOT DETECTED NOT DETECTED Final   Pseudomonas aeruginosa NOT DETECTED NOT DETECTED Final   Stenotrophomonas maltophilia NOT DETECTED NOT DETECTED Final   Candida albicans NOT DETECTED NOT DETECTED Final   Candida auris NOT DETECTED NOT DETECTED Final   Candida glabrata NOT DETECTED NOT DETECTED Final   Candida krusei NOT DETECTED NOT DETECTED Final   Candida parapsilosis NOT DETECTED NOT DETECTED Final   Candida tropicalis NOT DETECTED NOT DETECTED Final   Cryptococcus neoformans/gattii NOT DETECTED NOT DETECTED Final   Meth resistant mecA/C and MREJ DETECTED (A) NOT DETECTED Final    Comment: CRITICAL RESULT CALLED TO, READ BACK BY AND VERIFIED WITHArdelle Balls PHARMD 1975 09/20/20 A BROWNING Performed at [facility], [address], [place], [place] [address]   Urine culture     Status: Abnormal (Preliminary result)   Collection Time: 09/19/20 11:27 AM   Specimen: Urine, Random  Result Value Ref Range Status   Specimen Description URINE, RANDOM  Final   Special Requests NONE  Final   Culture (A)  Final    50,000 COLONIES/mL PSEUDOMONAS AERUGINOSA 30,000 COLONIES/mL ENTEROCOCCUS FAECALIS SUSCEPTIBILITIES TO FOLLOW Performed at [facility], [address] [place], [place] [address]    Report Status PENDING  Incomplete   Organism ID, Bacteria PSEUDOMONAS AERUGINOSA (A)  Final      Susceptibility   Pseudomonas aeruginosa - MIC*    CEFTAZIDIME 4 SENSITIVE Sensitive     CIPROFLOXACIN <=0.25 SENSITIVE Sensitive     GENTAMICIN <=1 SENSITIVE Sensitive     IMIPENEM 2 SENSITIVE Sensitive     PIP/TAZO 8 SENSITIVE Sensitive     CEFEPIME 2 SENSITIVE Sensitive     * 50,000 COLONIES/mL PSEUDOMONAS AERUGINOSA     Marcos Eke, NP [facility]  09/21/2020  9:27 AM

## 2020-09-22 ENCOUNTER — Encounter (HOSPITAL_COMMUNITY): Payer: Self-pay | Admitting: Family Medicine

## 2020-09-22 DIAGNOSIS — Z515 Encounter for palliative care: Secondary | ICD-10-CM

## 2020-09-22 LAB — CULTURE, BLOOD (ROUTINE X 2)

## 2020-09-22 MED ORDER — MORPHINE SULFATE 10 MG/5ML PO SOLN: 5.0000 mg | ORAL | 0 refills | Status: DC | PRN

## 2020-09-22 MED ORDER — SALINE SPRAY 0.65 % NA SOLN
1.0000 | NASAL | Status: DC | PRN
  Filled 2020-09-22: qty 44

## 2020-09-22 MED ORDER — MORPHINE SULFATE 10 MG/5ML PO SOLN: 5.0000 mg | ORAL | 0 refills | Status: AC | PRN

## 2020-09-22 NOTE — TOC Initial Note (Signed)
Transition of Care [facility]) - Initial/Assessment Note    Patient Details  Name: Suzanne Fox MRN: [id number] Date of Birth: 1929/10/23  Transition [facility]) CM/SW Contact:    Mearl Latin, LCSW Phone Number: 09/22/2020, 3:47 PM  Clinical Narrative:                 CSW received request to discharge patient to [facility] with Hospice. CSW discussed with [facility] and they can accept patient back today with [facility]. CSW spoke with patient's niece, Boone Master, and she is in agreement with plan. CSW will arrange PTAR.   Expected Discharge Plan: Skilled Nursing Facility Barriers to Discharge: No Barriers Identified   Patient Goals and CMS Choice Patient states their goals for this hospitalization and ongoing recovery are:: Comfort CMS Medicare.gov Compare Post Acute Care list provided to:: Patient Represenative (must comment) Choice offered to / list presented to :  (Niece)  Expected Discharge Plan and Services Expected Discharge Plan: Skilled Nursing Facility In-house Referral: Clinical Social Work, Hospice / Palliative Care   Post Acute Care Choice: Hospice, Skilled Nursing Facility Living arrangements for the past 2 months: Skilled Nursing Facility Expected Discharge Date: 09/22/20                                    Prior Living Arrangements/Services Living arrangements for the past 2 months: Skilled Nursing Facility Lives with:: Facility Resident Patient language and need for interpreter reviewed:: Yes Do you feel safe going back to the place where you live?: Yes      Need for Family Participation in Patient Care: Yes (Comment) Care giver support system in place?: Yes (comment)   Criminal Activity/Legal Involvement Pertinent to Current Situation/Hospitalization: No - Comment as needed  Activities of Daily Living   ADL Screening (condition at time of admission) Is the patient deaf or have difficulty hearing?: Yes Does the patient have  difficulty seeing, even when wearing glasses/contacts?: Yes Does the patient have difficulty concentrating, remembering, or making decisions?: Yes Does the patient have difficulty dressing or bathing?: Yes Does the patient have difficulty walking or climbing stairs?: Yes  Permission Sought/Granted Permission sought to share information with : Facility Medical sales representative, Family Supports Permission granted to share information with : No  Share Information with NAME: Cherrie  Permission granted to share info w AGENCY: [facility]  Permission granted to share info w Relationship: Niece  Permission granted to share info w Contact Information: [phone]  Emotional Assessment Appearance:: Appears stated age Attitude/Demeanor/Rapport: Unable to Assess Affect (typically observed): Unable to Assess Orientation: : Oriented to Self Alcohol / Substance Use: Not Applicable Psych Involvement: No (comment)  Admission diagnosis:  Pneumonia [J18.9] Pain [R52] LFT elevation [R79.89] Leukocytosis, unspecified type [D72.829] Community acquired pneumonia, unspecified laterality [J18.9] Patient Active Problem List   Diagnosis Date Noted   Hospice care patient 09/22/2020   Palliative care patient 09/22/2020   MRSA bacteremia 09/21/2020   Pneumonia 09/19/2020   Elevated WBC count 09/19/2020   COVID-19 virus infection 09/19/2020   Transaminitis 09/19/2020   Edema of right upper extremity 09/19/2020   Iron deficiency anemia 09/19/2020   Pressure ulcer of ankle 07/29/2020   Malnutrition of moderate degree 07/14/2020   Hyponatremia 07/13/2020   Benign essential HTN 07/13/2020   Type 2 diabetes mellitus without complication (HCC) 07/13/2020   Protein-calorie malnutrition, severe 06/14/2020   Epidural hematoma (HCC) 06/11/2020   Right shoulder  pain 06/10/2020   Nontraumatic epidural hematoma (HCC) 06/10/2020   Dementia without behavioral disturbance (HCC) 06/10/2020   Hypothyroidism  (acquired) 06/10/2020   Hypertension    Hyperlipidemia    Pain due to onychomycosis of toenails of both feet 09/05/2019   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

## 2020-09-22 NOTE — Plan of Care (Signed)
  Problem: Clinical Measurements: Goal: Will remain free from infection Outcome: Progressing   Problem: Nutrition: Goal: Adequate nutrition will be maintained Outcome: Progressing   Problem: Education: Goal: Knowledge of General Education information will improve Description: Including pain rating scale, medication(s)/side effects and non-pharmacologic comfort measures Outcome: Not Progressing   Problem: Health Behavior/Discharge Planning: Goal: Ability to manage health-related needs will improve Outcome: Not Progressing

## 2020-09-22 NOTE — TOC Transition Note (Signed)
Transition of Care [facility]) - CM/SW Discharge Note   Patient Details  Name: Suzanne Fox MRN: [id number] Date of Birth: 1929/10/26  [facility]) CM/SW Contact:  Mearl Latin, LCSW Phone Number: 09/22/2020, 4:36 PM   Clinical Narrative:    Patient will DC to: [facility] Anticipated DC date: 09/22/20 Family notified: Niece, Risk manager by: Sharin Mons  RN please contact Cherrie once PTAR arrives.   Per MD patient ready for DC to [facility]. RN to call report prior to discharge [phone]). RN, patient, patient's family, hospice, and facility notified of DC. Discharge Summary and FL2 sent to facility. DC packet on chart. Ambulance transport requested for patient.   CSW will sign off for now as social work intervention is no longer needed. Please consult [place] again if new needs arise.     Final next level of care: Skilled Nursing Facility Barriers to Discharge: No Barriers Identified   Patient Goals and CMS Choice Patient states their goals for this hospitalization and ongoing recovery are:: Comfort CMS Medicare.gov Compare Post Acute Care list provided to:: Patient Represenative (must comment) Choice offered to / list presented to :  (Niece)  Discharge Placement              Patient chooses bed at: [facility] Patient to be transferred to facility by: PTAR Name of family member notified: Niece Patient and family notified of of transfer: 09/22/20  Discharge Plan and Services In-house Referral: Clinical Social Work, Hospice / Palliative Care   Post Acute Care Choice: Hospice, Skilled Nursing Facility                               Social Determinants of Health (SDOH) Interventions     Readmission Risk Interventions No flowsheet data found.

## 2020-09-22 NOTE — Progress Notes (Signed)
Civil engineer, contracting [facility]) Hospital Liaison: RN note    This patient was originally referred to [facility] for [facility]PU at [facility]. The family has decided for her to return to [facility] with hospice instead.  [facility] will coordinate with [facility] to admit her to hospice Services.  Please send signed and completed DNR form with patient at discharge.    Patient will need prescriptions for discharge comfort medications until hospice is in place.      A Please do not hesitate to call with questions.    Thank you,   Elsie Saas, RN, [facility]      [facility] Liaison (listed on [facility] under Hospice /[facility])    [phone]

## 2020-09-22 NOTE — Progress Notes (Signed)
[facility] ([facility]) Hospital Liaison note.   [facility] is unable to offer a room today. Hospital Liaison will follow up tomorrow or sooner if a room becomes available. Eligibility has been confirmed.   Please do not hesitate to call with questions.    Thank you,   Mary Anne Robertson, RN, CCM      [facility] Hospital Liaison   [phone] 

## 2020-09-22 NOTE — [place] FL2 (Signed)
[place] MEDICAID FL2 LEVEL OF CARE SCREENING TOOL     IDENTIFICATION  Patient Name: Suzanne Fox Birthdate: March 02, 1930 Sex: female Admission Date (Current Location): 09/19/2020  [facility] [place] and [place] Number:  Producer, television/film/video and Address:  [facility] [place], [address], [place], [place] [address]      Provider Number: [id number]  Attending Physician Name and Address:  Azucena Fallen, MD  Relative Name and Phone Number:  Boone Master niece, [phone]    Current Level of Care: Hospital Recommended Level of Care: Skilled Nursing Facility Prior Approval Number:    Date Approved/Denied:   PASRR Number: [id number]  Discharge Plan: SNF    Current Diagnoses: Patient Active Problem List   Diagnosis Date Noted   MRSA bacteremia 09/21/2020   Pneumonia 09/19/2020   Elevated WBC count 09/19/2020   COVID-19 virus infection 09/19/2020   Transaminitis 09/19/2020   Edema of right upper extremity 09/19/2020   Iron deficiency anemia 09/19/2020   Pressure ulcer of ankle 07/29/2020   Malnutrition of moderate degree 07/14/2020   Hyponatremia 07/13/2020   Benign essential HTN 07/13/2020   Type 2 diabetes mellitus without complication (HCC) 07/13/2020   Protein-calorie malnutrition, severe 06/14/2020   Epidural hematoma (HCC) 06/11/2020   Right shoulder pain 06/10/2020   Nontraumatic epidural hematoma (HCC) 06/10/2020   Dementia without behavioral disturbance (HCC) 06/10/2020   Hypothyroidism (acquired) 06/10/2020   Hypertension    Hyperlipidemia    Pain due to onychomycosis of toenails of both feet 09/05/2019   Anterior ischemic optic neuropathy 01/11/2014    Orientation RESPIRATION BLADDER Height & Weight     Self  Normal Continent Weight: 122 lb 9.2 oz (55.6 kg) Height:  5\' 5"  (165.1 cm)  BEHAVIORAL SYMPTOMS/MOOD NEUROLOGICAL BOWEL NUTRITION STATUS      Continent Diet (Please see DC Summary)  AMBULATORY STATUS COMMUNICATION OF NEEDS Skin    Extensive Assist Verbally Other (Comment) (diabetic ulcer on heel; ulcer on knee)                       Personal Care Assistance Level of Assistance  Bathing, Feeding, Dressing Bathing Assistance: Maximum assistance Feeding assistance: Maximum assistance Dressing Assistance: Maximum assistance     Functional Limitations Info             SPECIAL CARE FACTORS FREQUENCY                       Contractures Contractures Info: Not present    Additional Factors Info  Code Status, Allergies, Isolation Precautions Code Status Info: DNR Allergies Info: Aricept (Donepezil Hcl), Aricept (Donepezil), Other, Sulfa Antibiotics     Isolation Precautions Info: VRE     Current Medications (09/22/2020):  This is the current hospital active medication list Current Facility-Administered Medications  Medication Dose Route Frequency Provider Last Rate Last Admin   0.9 %  sodium chloride infusion   Intravenous Continuous 09/24/2020, MD   Stopped at 09/22/20 0700   guaiFENesin-dextromethorphan (ROBITUSSIN DM) 100-10 MG/5ML syrup 10 mL  10 mL Oral Q4H PRN 09/24/20, MD       LORazepam (ATIVAN) tablet 0.5 mg  0.5 mg Oral Q4H PRN Orland Mustard, NP       morphine 10 MG/5ML solution 5 mg  5 mg Oral Q2H PRN Canary Brim, NP   5 mg at 09/22/20 1227   mupirocin ointment (BACTROBAN) 2 %   Topical Daily 09/24/20, MD  Given at 09/22/20 1000   sodium chloride (OCEAN) 0.65 % nasal spray 1 spray  1 spray Each Nare PRN Azucena Fallen, MD         Discharge Medications: Please see discharge summary for a list of discharge medications.  Relevant Imaging Results:  Relevant Lab Results:   Additional Information SSN# [id number]. Hospice to follow at [facility], LCSW

## 2020-09-22 NOTE — Progress Notes (Addendum)
Found PROGRESS NOTE   Suzanne CahillJennie D Fox  FAO:[id number] DOB: 12/31/1979 DOA: 09/19/2020 PCP: Georgianne Fickamachandran, Ajith, MD  Brief Narrative:  [age] [address] female [facility] resident, Prior VRE UTI Severe hyponatremia-(110) multifactorial-2/2 thiazide-ICU- Rx 3% saline on admit 07/13/2020 C2-C3, C3-C4, C4-C5 severe stenosis/epidural hematoma status post emergent decompression/fusion 06/10/2020 Dr. Jake Samplesawley neurosurgery--ICU stay at the time, DM TY 2, HTN HLD hypothyroid, Stage III-IV dementia with adult failure to thrive and protein energy malnutrition, Also chr L heel ulcer  Recent COVID-positive 6/13 without symptoms Also recent Rx 6/20 VRE UTI with ertapenem  Presents with increasing confusion since 6/24-just increased baclofen at skilled facility CXR LLL pleural fluid versus pneumonia CT abdomen pelvis ultrasound right upper quadrant negative  Hospital-Problem based course  GOALS OF CARE - COMFORT MEASURES Patient now scheduled to discharge to hospice once bed has been made availble Discontinue all aggressive care, invasive measures and labs/imaging.  Other diagnoses:  Toxic metabolic encephalopathy-secondary to infection and polypharmacy (Rx for prior VRE UTI/ertapenem since 6/20) Concern for LLL infiltrate confirmed Repeat x-ray 6/28 questionable LLL pneumonia - remains without symptoms -start Unasyn 2/2  low-grade fevers overnight 6/27--family still contemplating goals of care--I am okay with low-dose morphine at this time Continue NS 50 cc/H at this time for now- Continue low dose baclofen only - other CNS depressants discontinued Mentation has improved significantly see below--at baseline she is somewhat interactive but not coherent completely  MRSA bacteremia 1/4 bottles MRSA An echo, BC X2 on 6/28 was ordered by ID which will be followed  Urinary retention with chronic Foley-colonized Pseudomonas aeruginosa, Enterococcus faecalis Outpatient urology follow-up not required-requires  catheter because of risk for skin breakdown  Pressure wound left foot and heel medially and laterally Appreciate wound nurse input "Dressing procedure/placement/frequency: Cleanse wounds to left foot and heel with NS and pat dry. Apply Mupirocin ointment to wound bed.  Cover with Foam dressing.  Change daily" Hold imaging of left foot for now as it would not change management with bacteremia and Unasyn at this time If goals of care as per discussion below are different we will consider MRI Get CRP with next labs Hypertension with underlying hypotensive episodes Continue midodrine twice daily [prior on florinef] -Stable Prior severe hyponatremia This is currently stable-discontinue salt tablets going forward on discharge Transaminitis US ABD, CT ABD   [-] liver pathology?  Infectious versus ertapenem related Cmet am until goals clearer DM TY 2 A1c 6.8 Stop aggressive  monitor  SSI EOL discussions Niece saw her Friday--2 weeks prior was isolated --from 6/13-->last Friday had changed a lot. We discussed goals of care--please see below Prior neck hematoma and fusion 3/17 Hypothyroid Hyperlipidemia   DVT prophylaxis: Lovenox Code Status: DNR Family Communication: Basilia JumboCheri Burgess contacted [phone]  Disposition:  Status is: Inpatient  Remains inpatient appropriate because:Hemodynamically unstable, Ongoing active pain requiring inpatient pain management, and Altered mental status  Dispo: The patient is from: SNF              Anticipated d/c is to: SNF              Patient currently is not medically stable to d/c.   Difficult to place patient No  Consultants:  Palliative care  Subjective: No acute events or issues overnight  Objective: Vitals:   09/21/20 0623 09/21/20 0705 09/21/20 1300 09/21/20 1939  BP: 140/62  110/88 (!) 150/65  Pulse: (!) 110  94 96  Resp: 16  15 18   Temp: (!) 100.4 F (38 C)  98.2 F (36.8 C) 98.8 F (37.1 C)  TempSrc:    Oral  SpO2: 99% 98%  100% 99%  Weight:      Height:        Intake/Output Summary (Last 24 hours) at 09/22/2020 0813 Last data filed at 09/22/2020 9371 Gross per 24 hour  Intake 100 ml  Output 350 ml  Net -250 ml    Filed Weights   09/19/20 1032  Weight: 55.6 kg    Examination:  Frail black female bitemporal wasting No rhonchi no wheeze Abdomen soft without rebound or guarding I did not examine her heels today Moving 4 limbs without gross deficit Quite cachectic She is intelligible more so today than she was yesterday but not very coherent and I would say she is oriented x2   Data Reviewed: personally reviewed   CBC    Component Value Date/Time   WBC 22.5 (H) 09/20/2020 0252   RBC 3.82 (L) 09/20/2020 0252   HGB 9.8 (L) 09/20/2020 0252   HCT 31.6 (L) 09/20/2020 0252   PLT 430 (H) 09/20/2020 0252   MCV 82.7 09/20/2020 0252   MCH 25.7 (L) 09/20/2020 0252   MCHC 31.0 09/20/2020 0252   RDW 16.4 (H) 09/20/2020 0252   LYMPHSABS 0.8 09/19/2020 1121   MONOABS 1.2 (H) 09/19/2020 1121   EOSABS 0.1 09/19/2020 1121   BASOSABS 0.1 09/19/2020 1121   CMP Latest Ref Rng & Units 09/20/2020 09/19/2020 08/04/2020  Glucose 70 - 99 mg/dL 696(V) 893(Y) 101(B)  BUN 8 - 23 mg/dL 16 17 10   Creatinine 0.44 - 1.00 mg/dL ) 5.10(C 5.85  Sodium 135 - 145 mmol/L 133(L) 132(L) 135  Potassium 3.5 - 5.1 mmol/L 4.8 5.2(H) 3.6  Chloride 98 - 111 mmol/L 99 96(L) 101  CO2 22 - 32 mmol/L 25 28 27   Calcium 8.9 - 10.3 mg/dL 2.77) 9.1 9.1  Total Protein 6.5 - 8.1 g/dL ) 6.5 -  Total Bilirubin 0.3 - 1.2 mg/dL 0.5 0.8 -  Alkaline Phos 38 - 126 U/L 143(H) 184(H) -  AST 15 - 41 U/L 44(H) 71(H) -  ALT 0 - 44 U/L 102(H) 146(H) -     Radiology Studies: DG CHEST PORT 1 VIEW  Result Date: 09/21/2020 CLINICAL DATA:  Pneumonia. EXAM: PORTABLE CHEST 1 VIEW COMPARISON:  06/10/2020. FINDINGS: Heart size normal. Left base infiltrate consistent pneumonia, slightly improved from prior exam. Effusion, improved from prior  exam. No pneumothorax. Prior cervical spine fusion. Degenerative changes thoracic spine and both shoulders. IMPRESSION: Left base infiltrate consistent with pneumonia. Slight improvement from prior exam. Left pleural effusion, improved from prior exam. Electronically Signed   By: 09/23/2020  Register   On: 09/21/2020 08:46   ECHOCARDIOGRAM COMPLETE  Result Date: 09/21/2020    ECHOCARDIOGRAM REPORT   Patient Name:   MARSHEILA ALEJO Date of Exam: 09/21/2020 Medical Rec #:  Suzanne Fox     Height:       65.0 in Accession #:    09/23/2020    Weight:       122.6 lb Date of Birth:  01/17/1930     BSA:          1.607 m Patient Age:    87 years      BP:           140/62 mmHg Patient Gender: F             HR:           91 bpm. Exam Location:  Inpatient  Procedure: 2D Echo, Color Doppler and Cardiac Doppler Indications:    Bacteremia R78.81  History:        Patient has no prior history of Echocardiogram examinations.                 Risk Factors:Hypertension, Dyslipidemia and Diabetes.  Sonographer:    Irving Burton Senior RDCS Referring Phys: [id number] GREGORY D CALONE  Sonographer Comments: Technically difficult study due to lung and rib interference, apical window is the only diagnostic window available. IMPRESSIONS  1. Left ventricular ejection fraction, by estimation, is 55 to 60%. The left ventricle has normal function. The left ventricle has no regional wall motion abnormalities. Left ventricular diastolic parameters are consistent with Grade I diastolic dysfunction (impaired relaxation).  2. Right ventricular systolic function is normal. The right ventricular size is normal. Tricuspid regurgitation signal is inadequate for assessing PA pressure.  3. The mitral valve is normal in structure. No evidence of mitral valve regurgitation.  4. The aortic valve is grossly normal. Aortic valve regurgitation is not visualized. Mild aortic valve sclerosis is present, with no evidence of aortic valve stenosis. Conclusion(s)/Recommendation(s): No  evidence of valvular vegetations on this transthoracic echocardiogram. Would recommend a transesophageal echocardiogram to exclude infective endocarditis if clinically indicated. FINDINGS  Left Ventricle: Left ventricular ejection fraction, by estimation, is 55 to 60%. The left ventricle has normal function. The left ventricle has no regional wall motion abnormalities. The left ventricular internal cavity size was normal in size. There is  no left ventricular hypertrophy. Left ventricular diastolic parameters are consistent with Grade I diastolic dysfunction (impaired relaxation). Normal left ventricular filling pressure. Right Ventricle: The right ventricular size is normal. No increase in right ventricular wall thickness. Right ventricular systolic function is normal. Tricuspid regurgitation signal is inadequate for assessing PA pressure. Left Atrium: Left atrial size was normal in size. Right Atrium: Right atrial size was normal in size. Pericardium: There is no evidence of pericardial effusion. Mitral Valve: The mitral valve is normal in structure. No evidence of mitral valve regurgitation. Tricuspid Valve: The tricuspid valve is normal in structure. Tricuspid valve regurgitation is not demonstrated. Aortic Valve: The aortic valve is grossly normal. Aortic valve regurgitation is not visualized. Mild aortic valve sclerosis is present, with no evidence of aortic valve stenosis. Pulmonic Valve: The pulmonic valve was not well visualized. Aorta: The aortic root was not well visualized. IAS/Shunts: The interatrial septum was not well visualized.  LEFT VENTRICLE PLAX 2D LVIDd:         3.10 cm  Diastology LVIDs:         2.30 cm  LV e' medial:    5.66 cm/s LV PW:         0.60 cm  LV E/e' medial:  12.6 LV IVS:        0.60 cm  LV e' lateral:   9.68 cm/s LVOT diam:     1.90 cm  LV E/e' lateral: 7.4 LV SV:         51 LV SV Index:   32 LVOT Area:     2.84 cm  RIGHT VENTRICLE RV S prime:     11.30 cm/s TAPSE (M-mode): 1.8 cm  LEFT ATRIUM           Index       RIGHT ATRIUM           Index LA diam:      2.60 cm 1.62 cm/m  RA Area:     10.80 cm LA  Vol [facility]): 9.4 ml  5.85 ml/m  RA Volume:   22.20 ml  13.82 ml/m LA Vol (A4C): 19.8 ml 12.32 ml/m  AORTIC VALVE LVOT Vmax:   94.20 cm/s LVOT Vmean:  63.800 cm/s LVOT VTI:    0.180 m  AORTA Ao Root diam: 2.80 cm MITRAL VALVE MV Area (PHT): 2.19 cm     SHUNTS MV Decel Time: 346 msec     Systemic VTI:  0.18 m MV E velocity: 71.30 cm/s   Systemic Diam: 1.90 cm MV A velocity: 114.00 cm/s MV E/A ratio:  0.63 Mihai Croitoru MD Electronically signed by Thurmon Fair MD Signature Date/Time: 09/21/2020/3:25:52 PM    Final      Scheduled Meds:  mupirocin ointment   Topical Daily   Continuous Infusions:  sodium chloride 50 mL/hr at 09/20/20 1544     LOS: 3 days   Time spent: 79  Azucena Fallen, MD [facility] To contact the attending provider between 7A-7P or the covering provider during after hours 7P-7A, please log into the web site [email or web address] and access using universal [place] password for that web site. If you do not have the password, please call the hospital operator.  09/22/2020, 8:13 AM

## 2020-09-22 NOTE — Discharge Summary (Addendum)
Physician Discharge Summary  Suzanne Fox XBJ:[id number] DOB: January 17, 1930 DOA: 09/19/2020  PCP: Georgianne Fick, MD  Admit date: 09/19/2020 Discharge date: 09/22/2020  Admitted From: [facility] Disposition:  Hospice  Discharge Condition:Guarded  CODE STATUS:DNR  Diet recommendation: As tolerated    Brief/Interim Summary: 4 W female [facility] resident, Prior VRE UTI; Severe hyponatremia-(110) multifactorial-2/2 thiazide-ICU- Rx 3% saline on admit 07/13/2020 C2-C3, C3-C4, C4-C5 severe stenosis/epidural hematoma status post emergent decompression/fusion 06/10/2020 Dr. Jake Samples neurosurgery--ICU stay at the time, DM TY 2, HTN HLD hypothyroid, Stage III-IV dementia with adult failure to thrive and protein energy malnutrition, Also chr L heel ulcer. Recent COVID-positive 6/13 without symptoms. Also recent Rx 6/20 VRE UTI with ertapenem   Presents with increasing confusion since 6/24-just increased baclofen at skilled facility CXR LLL pleural fluid versus pneumonia CT abdomen pelvis ultrasound right upper quadrant negative. Patient continued to decline over the past few days - palliative care consulted -and family agreeable now to make patient comfort measures with transition to hospice care.  Hospice house at [facility] has agreed to take the patient under their care we appreciate assistance    GOALS OF CARE - COMFORT MEASURES Patient now scheduled to discharge to hospice once bed has been made availble Discontinue all aggressive care, invasive measures and labs/imaging.   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 Apply Mupirocin ointment to wound bed.  Cover with Foam dressing.  Change daily"  Hypertension with underlying hypotensive episodes Continue midodrine twice daily [prior on florinef] -Stable  Prior severe hyponatremia This is currently stable-discontinue salt tablets going forward on discharge  Transaminitis  DM type 2, non insulin dependent A1c 6.8  Prior neck hematoma and fusion 3/17  Hypothyroid  Hyperlipidemia    Discharge Diagnoses:  Principal Problem:   MRSA bacteremia Active Problems:   Dementia without behavioral disturbance (HCC)   Hypertension   Hyperlipidemia   Hypothyroidism (acquired)   Hyponatremia   Type 2 diabetes mellitus without complication (HCC)   Malnutrition of moderate degree   Pressure ulcer of ankle   Pneumonia   Elevated WBC count   COVID-19 virus infection   Transaminitis   Edema of right upper extremity   Iron deficiency anemia   Hospice care patient   Palliative care patient    Discharge Instructions  Discharge Instructions     Diet general   Complete by: As directed    No wound care   Complete by: As directed       Allergies as of 09/22/2020       Reactions   Aricept [donepezil Hcl]    Per [facility]   Aricept [donepezil]    Other reaction(s): localised rash   Other Other (See Comments)   Sulfa Antibiotics    Per [facility]         Medication List  STOP taking these medications    acetaminophen 325 MG tablet Commonly known as: TYLENOL   Baclofen 5 MG Tabs   collagenase ointment Commonly known as: SANTYL   [employer]  Manuka Honey Wound Gel   diclofenac Sodium 1 % Gel Commonly known as: VOLTAREN   ERTAPENEM SODIUM IJ   feeding supplement Liqd   Gerhardt's butt cream Crea   guaiFENesin-dextromethorphan 100-10 MG/5ML syrup Commonly known as: ROBITUSSIN DM   levothyroxine 25 MCG tablet Commonly known as: SYNTHROID   magnesium oxide 400 MG tablet Commonly known as: MAG-OX   memantine 10 MG tablet Commonly known as: NAMENDA   methocarbamol 500 MG tablet Commonly known as: ROBAXIN   midodrine 2.5 MG tablet Commonly known as: PROAMATINE   povidone-Iodine 5 % Soln topical solution Commonly known as: BETADINE   rOPINIRole 0.25 MG tablet Commonly known as: REQUIP   sodium chloride 1 g tablet   tamsulosin 0.4 MG Caps capsule Commonly known as: FLOMAX   traMADol 50 MG tablet Commonly known as: ULTRAM       TAKE these medications    morphine 10 MG/5ML solution Take 2.5 mLs (5 mg total) by mouth every 2 (two) hours as needed for severe pain.        Contact information for after-discharge care     Destination     [facility] Preferred SNF .   Service: Skilled Nursing Contact information: [address] [place] [address] [phone]                    Allergies  Allergen Reactions   Aricept [Donepezil Hcl]     Per [facility]   Aricept [Donepezil]     Other reaction(s): localised rash   Other Other (See Comments)   Sulfa Antibiotics     Per [facility]     Consultations: Palliative  Procedures/Studies: DG Chest 1 View  Result Date: 09/19/2020 CLINICAL DATA:  Confused patient. No known injury. Patient uncooperative for exam. EXAM: CHEST  1 VIEW COMPARISON:  06/10/2020. FINDINGS: Hazy opacity is noted at the left lung base obscuring the hemidiaphragm, new since the prior exam. Remainder of the lungs is clear. Cardiac silhouette is normal in size. Dense aortic atherosclerotic calcifications. No mediastinal or hilar masses. No pneumothorax.  Skeletal structures are demineralized. There are arthropathic changes of the shoulders, most severe on the right. IMPRESSION: 1. New opacity at the left lung base consistent with a combination of pleural fluid and atelectasis or pneumonia. 2. No other evidence of an acute abnormality.  No pulmonary edema. Electronically Signed   By: Amie Portland M.D.   On: 09/19/2020 12:18   DG Wrist Complete Right  Result Date: 09/19/2020 CLINICAL DATA:  Confused patient. No known injury. Patient uncooperative for exam. EXAM: RIGHT WRIST - COMPLETE 3+ VIEW COMPARISON:  None. FINDINGS: No fracture.  No bone lesion. Joints normally spaced and aligned. Mild surrounding soft tissue swelling suggested. IMPRESSION: No fracture or dislocation. Electronically Signed   By: Amie Portland M.D.   On: 09/19/2020 12:17   CT ABDOMEN PELVIS W CONTRAST  Result Date: 09/19/2020 CLINICAL DATA:  Abdominal pain. EXAM: CT ABDOMEN AND PELVIS WITH CONTRAST TECHNIQUE: Multidetector CT imaging of the abdomen and pelvis was performed using the standard protocol following bolus administration of intravenous contrast. CONTRAST:  OMNIPAQUE IOHEXOL 300 MG/ML  SOLN COMPARISON:  None. FINDINGS: Lower chest: Small left and trace right pleural effusions with left greater than right lower lobe atelectasis. Hepatobiliary: No focal liver  abnormality is seen. No gallstones, gallbladder wall thickening, or biliary dilatation. Pancreas: Unremarkable. No pancreatic ductal dilatation or surrounding inflammatory changes. Spleen: Normal in size without focal abnormality. Adrenals/Urinary Tract: Bilateral adrenal gland thickening. Small bilateral renal cysts. No renal calculi or hydronephrosis. The bladder is decompressed by Foley catheter. Stomach/Bowel: Stomach is within normal limits. Diminutive or absent appendix. No evidence of bowel wall thickening, distention, or inflammatory changes. Vascular/Lymphatic: Aortic atherosclerosis. No enlarged abdominal or  pelvic lymph nodes. Reproductive: Status post hysterectomy. No adnexal masses. Other: No abdominal wall hernia or abnormality. No abdominopelvic ascites. No pneumoperitoneum. Musculoskeletal: No acute or significant osseous findings. IMPRESSION: 1. No acute intra-abdominal process. 2. Small left and trace right pleural effusions with left greater than right lower lobe atelectasis. 3. Aortic Atherosclerosis (ICD10-I70.0). Electronically Signed   By: Obie Dredge M.D.   On: 09/19/2020 13:32   DG CHEST PORT 1 VIEW  Result Date: 09/21/2020 CLINICAL DATA:  Pneumonia. EXAM: PORTABLE CHEST 1 VIEW COMPARISON:  06/10/2020. FINDINGS: Heart size normal. Left base infiltrate consistent pneumonia, slightly improved from prior exam. Effusion, improved from prior exam. No pneumothorax. Prior cervical spine fusion. Degenerative changes thoracic spine and both shoulders. IMPRESSION: Left base infiltrate consistent with pneumonia. Slight improvement from prior exam. Left pleural effusion, improved from prior exam. Electronically Signed   By: Maisie Fus  Register   On: 09/21/2020 08:46   DG Hand Complete Right  Result Date: 09/19/2020 CLINICAL DATA:  Confused patient. No known injury. Patient uncooperative for exam. EXAM: RIGHT HAND - COMPLETE 3+ VIEW COMPARISON:  None. FINDINGS: No fracture or bone lesion.  Skeletal structures are demineralized. Joints are normally aligned. Mild soft tissue swelling dorsally. IMPRESSION: No fracture or dislocation. Electronically Signed   By: Amie Portland M.D.   On: 09/19/2020 12:19   ECHOCARDIOGRAM COMPLETE  Result Date: 09/21/2020    ECHOCARDIOGRAM REPORT   Patient Name:   Suzanne Fox Date of Exam: 09/21/2020 Medical Rec #:  [id number]     Height:       65.0 in Accession #:    [id number]    Weight:       122.6 lb Date of Birth:  1930-03-26     BSA:          1.607 m Patient Age:    87 years      BP:           140/62 mmHg Patient Gender: F             HR:           91 bpm. Exam Location:   Inpatient Procedure: 2D Echo, Color Doppler and Cardiac Doppler Indications:    Bacteremia R78.81  History:        Patient has no prior history of Echocardiogram examinations.                 Risk Factors:Hypertension, Dyslipidemia and Diabetes.  Sonographer:    Irving Burton Senior RDCS Referring Phys: [id number] GREGORY D CALONE  Sonographer Comments: Technically difficult study due to lung and rib interference, apical window is the only diagnostic window available. IMPRESSIONS  1. Left ventricular ejection fraction, by estimation, is 55 to 60%. The left ventricle has normal function. The left ventricle has no regional wall motion abnormalities. Left ventricular diastolic parameters are consistent with Grade I diastolic dysfunction (impaired relaxation).  2. Right ventricular systolic function is normal. The right ventricular size is normal. Tricuspid regurgitation signal is inadequate for assessing PA pressure.  3. The mitral  valve is normal in structure. No evidence of mitral valve regurgitation.  4. The aortic valve is grossly normal. Aortic valve regurgitation is not visualized. Mild aortic valve sclerosis is present, with no evidence of aortic valve stenosis. Conclusion(s)/Recommendation(s): No evidence of valvular vegetations on this transthoracic echocardiogram. Would recommend a transesophageal echocardiogram to exclude infective endocarditis if clinically indicated. FINDINGS  Left Ventricle: Left ventricular ejection fraction, by estimation, is 55 to 60%. The left ventricle has normal function. The left ventricle has no regional wall motion abnormalities. The left ventricular internal cavity size was normal in size. There is  no left ventricular hypertrophy. Left ventricular diastolic parameters are consistent with Grade I diastolic dysfunction (impaired relaxation). Normal left ventricular filling pressure. Right Ventricle: The right ventricular size is normal. No increase in right ventricular wall thickness. Right  ventricular systolic function is normal. Tricuspid regurgitation signal is inadequate for assessing PA pressure. Left Atrium: Left atrial size was normal in size. Right Atrium: Right atrial size was normal in size. Pericardium: There is no evidence of pericardial effusion. Mitral Valve: The mitral valve is normal in structure. No evidence of mitral valve regurgitation. Tricuspid Valve: The tricuspid valve is normal in structure. Tricuspid valve regurgitation is not demonstrated. Aortic Valve: The aortic valve is grossly normal. Aortic valve regurgitation is not visualized. Mild aortic valve sclerosis is present, with no evidence of aortic valve stenosis. Pulmonic Valve: The pulmonic valve was not well visualized. Aorta: The aortic root was not well visualized. IAS/Shunts: The interatrial septum was not well visualized.  LEFT VENTRICLE PLAX 2D LVIDd:         3.10 cm  Diastology LVIDs:         2.30 cm  LV e' medial:    5.66 cm/s LV PW:         0.60 cm  LV E/e' medial:  12.6 LV IVS:        0.60 cm  LV e' lateral:   9.68 cm/s LVOT diam:     1.90 cm  LV E/e' lateral: 7.4 LV SV:         51 LV SV Index:   32 LVOT Area:     2.84 cm  RIGHT VENTRICLE RV S prime:     11.30 cm/s TAPSE (M-mode): 1.8 cm LEFT ATRIUM           Index       RIGHT ATRIUM           Index LA diam:      2.60 cm 1.62 cm/m  RA Area:     10.80 cm LA Vol (A2C): 9.4 ml  5.85 ml/m  RA Volume:   22.20 ml  13.82 ml/m LA Vol (A4C): 19.8 ml 12.32 ml/m  AORTIC VALVE LVOT Vmax:   94.20 cm/s LVOT Vmean:  63.800 cm/s LVOT VTI:    0.180 m  AORTA Ao Root diam: 2.80 cm MITRAL VALVE MV Area (PHT): 2.19 cm     SHUNTS MV Decel Time: 346 msec     Systemic VTI:  0.18 m MV E velocity: 71.30 cm/s   Systemic Diam: 1.90 cm MV A velocity: 114.00 cm/s MV E/A ratio:  0.63 Mihai Croitoru MD Electronically signed by Thurmon Fair MD Signature Date/Time: 09/21/2020/3:25:52 PM    Final    VAS [place] UPPER EXTREMITY VENOUS DUPLEX  Result Date: 09/20/2020 UPPER VENOUS STUDY   Patient Name:  Suzanne Fox  Date of Exam:   09/19/2020 Medical Rec #: [id number]  Accession #:    [id number] Date of Birth: [birth year]      Patient Gender: F Patient Age:   89Y Exam Location:  [address] [facility] Procedure:      VAS [place] UPPER EXTREMITY VENOUS DUPLEX Referring Phys: [id number] Canary Brim TEGELER --------------------------------------------------------------------------------  Indications: Pain Limitations: Non cooperative patient and body habitus. Comparison Study: Prior negative Right UE venous studies done 08/02/20 and 07/14/20 Performing Technologist: Sherren Kerns RVS  Examination Guidelines: A complete evaluation includes B-mode imaging, spectral Doppler, color Doppler, and power Doppler as needed of all accessible portions of each vessel. Bilateral testing is considered an integral part of a complete examination. Limited examinations for reoccurring indications may be performed as noted.  Right Findings: +----------+------------+---------+-----------+----------+---------------+ RIGHT     CompressiblePhasicitySpontaneousProperties    Summary     +----------+------------+---------+-----------+----------+---------------+ IJV                                                 Not visualized  +----------+------------+---------+-----------+----------+---------------+ Subclavian               Yes       Yes                              +----------+------------+---------+-----------+----------+---------------+ Axillary                 Yes       Yes                              +----------+------------+---------+-----------+----------+---------------+ Brachial      Full                                                  +----------+------------+---------+-----------+----------+---------------+ Radial                                              patent by color +----------+------------+---------+-----------+----------+---------------+ Ulnar                                                Not visualized  +----------+------------+---------+-----------+----------+---------------+ Cephalic      Full                                                  +----------+------------+---------+-----------+----------+---------------+ Basilic       Full                                                  +----------+------------+---------+-----------+----------+---------------+  Left Findings: +----------+------------+---------+-----------+----------+-------+ LEFT      CompressiblePhasicitySpontaneousPropertiesSummary +----------+------------+---------+-----------+----------+-------+ Subclavian  Yes       Yes                      +----------+------------+---------+-----------+----------+-------+  Summary:  Right: No obvious evidence of deep vein thrombosis in the visualized veins of the upper extremity. No evidence of superficial vein thrombosis in the upper extremity.  Left: No evidence of thrombosis in the subclavian.  *See table(s) above for measurements and observations.  Diagnosing physician: Fabienne Bruns MD Electronically signed by Fabienne Bruns MD on 09/20/2020 at 7:07:56 PM.    Final    US Abdomen Limited RUQ (LIVER/GB)  Result Date: 09/19/2020 CLINICAL DATA:  Elevated LFTs EXAM: ULTRASOUND ABDOMEN LIMITED RIGHT UPPER QUADRANT COMPARISON:  CT scan September 19, 2020 FINDINGS: Gallbladder: No gallstones or wall thickening visualized. No sonographic Murphy sign noted by sonographer. Common bile duct: Diameter: 3.9 mm Liver: No focal lesion identified. Within normal limits in parenchymal echogenicity. Portal vein is patent on color Doppler imaging with normal direction of blood flow towards the liver. Other: Small right pleural effusion. IMPRESSION: 1. A tiny right pleural effusion is identified. 2. No other abnormalities are identified. The study is limited due to patient immobility and pain. Electronically Signed   By: Gerome Sam III M.D    On: 09/19/2020 14:53     Subjective: No acute issues or events overnight   Discharge Exam: Vitals:   09/22/20 1149 09/22/20 1220  BP: 133/74   Pulse: (!) 142 (!) 112  Resp: (!) 22 19  Temp: 98.2 F (36.8 C)   SpO2: 99%    Vitals:   09/21/20 1300 09/21/20 1939 09/22/20 1149 09/22/20 1220  BP: 110/88 (!) 150/65 133/74   Pulse: 94 96 (!) 142 (!) 112  Resp: 15 18 (!) 22 19  Temp: 98.2 F (36.8 C) 98.8 F (37.1 C) 98.2 F (36.8 C)   TempSrc:  Oral Oral   SpO2: 100% 99% 99%   Weight:      Height:        General: Pt is alert, awake, not in acute distress Cardiovascular: RRR, S1/S2 +, no rubs, no gallops Respiratory: CTA bilaterally, no wheezing, no rhonchi Abdominal: Soft, NT, ND, bowel sounds + Extremities: no edema, no cyanosis    The results of significant diagnostics from this hospitalization (including imaging, microbiology, ancillary and laboratory) are listed below for reference.     Microbiology: Recent Results (from the past 240 hour(s))  Blood culture (routine x 2)     Status: Abnormal (Preliminary result)   Collection Time: 09/19/20 11:21 AM   Specimen: BLOOD  Result Value Ref Range Status   Specimen Description BLOOD LEFT ANTECUBITAL  Final   Special Requests   Final    BOTTLES DRAWN AEROBIC AND ANAEROBIC Blood Culture results may not be optimal due to an inadequate volume of blood received in culture bottles   Culture  Setup Time   Final    GRAM POSITIVE COCCI IN CLUSTERS ANAEROBIC BOTTLE ONLY CRITICAL VALUE NOTED.  VALUE IS CONSISTENT WITH PREVIOUSLY REPORTED AND CALLED VALUE.    Culture (A)  Final    STAPHYLOCOCCUS AUREUS SUSCEPTIBILITIES PERFORMED ON PREVIOUS CULTURE WITHIN THE LAST 5 DAYS. Performed at [facility], [address], [place], [place] [address]    Report Status PENDING  Incomplete  Blood culture (routine x 2)     Status: Abnormal   Collection Time: 09/19/20 11:26 AM   Specimen: BLOOD LEFT HAND  Result Value Ref Range  Status  Specimen Description BLOOD LEFT HAND  Final   Special Requests   Final    BOTTLES DRAWN AEROBIC AND ANAEROBIC Blood Culture results may not be optimal due to an inadequate volume of blood received in culture bottles   Culture  Setup Time   Final    GRAM POSITIVE COCCI AEROBIC BOTTLE ONLY CRITICAL RESULT CALLED TO, READ BACK BY AND VERIFIED WITHArdelle Balls PHARMD [id number] 09/20/20 A BROWNING Performed at [facility], [address] [place], [place] [address]    Culture METHICILLIN RESISTANT STAPHYLOCOCCUS AUREUS (A)  Final   Report Status 09/22/2020 FINAL  Final   Organism ID, Bacteria METHICILLIN RESISTANT STAPHYLOCOCCUS AUREUS  Final      Susceptibility   Methicillin resistant staphylococcus aureus - MIC*    CIPROFLOXACIN >=8 RESISTANT Resistant     ERYTHROMYCIN >=8 RESISTANT Resistant     GENTAMICIN <=0.5 SENSITIVE Sensitive     OXACILLIN >=4 RESISTANT Resistant     TETRACYCLINE <=1 SENSITIVE Sensitive     VANCOMYCIN <=0.5 SENSITIVE Sensitive     TRIMETH/SULFA >=320 RESISTANT Resistant     CLINDAMYCIN <=0.25 SENSITIVE Sensitive     RIFAMPIN <=0.5 SENSITIVE Sensitive     Inducible Clindamycin NEGATIVE Sensitive     * METHICILLIN RESISTANT STAPHYLOCOCCUS AUREUS  Resp Panel by RT-PCR (Flu A&B, Covid) Nasopharyngeal Swab     Status: Abnormal   Collection Time: 09/19/20 11:26 AM   Specimen: Nasopharyngeal Swab; Nasopharyngeal(NP) swabs in vial transport medium  Result Value Ref Range Status   SARS Coronavirus 2 by RT PCR POSITIVE (A) NEGATIVE Final    Comment: RESULT CALLED TO, READ BACK BY AND VERIFIED WITH: A,BANKS  09/19/20 EB (NOTE) SARS-CoV-2 target nucleic acids are DETECTED.  The SARS-CoV-2 RNA is generally detectable in upper respiratory specimens during the acute phase of infection. Positive results are indicative of the presence of the identified virus, but do not rule out bacterial infection or co-infection with other pathogens not detected by the test.  Clinical correlation with patient history and other diagnostic information is necessary to determine patient infection status. The expected result is Negative.  Fact Sheet for Patients: BloggerCourse.com  Fact Sheet for Healthcare Providers: SeriousBroker.it  This test is not yet approved or cleared by the [place] FDA and  has been authorized for detection and/or diagnosis of SARS-CoV-2 by FDA under an Emergency Use Authorization (EUA).  This EUA will remain in effect (meaning this test can be used) fo r the duration of  the COVID-19 declaration under Section 564(b)(1) of the Act, 21 U.S.C. section 360bbb-3(b)(1), unless the authorization is terminated or revoked sooner.     Influenza A by PCR NEGATIVE NEGATIVE Final   Influenza B by PCR NEGATIVE NEGATIVE Final    Comment: (NOTE) The Xpert Xpress SARS-CoV-2/FLU/RSV plus assay is intended as an aid in the diagnosis of influenza from Nasopharyngeal swab specimens and should not be used as a sole basis for treatment. Nasal washings and aspirates are unacceptable for Xpert Xpress SARS-CoV-2/FLU/RSV testing.  Fact Sheet for Patients: BloggerCourse.com  Fact Sheet for Healthcare Providers: SeriousBroker.it  This test is not yet approved or cleared by the [place] FDA and has been authorized for detection and/or diagnosis of SARS-CoV-2 by FDA under an Emergency Use Authorization (EUA). This EUA will remain in effect (meaning this test can be used) for the duration of the COVID-19 declaration under Section 564(b)(1) of the Act, 21 U.S.C. section 360bbb-3(b)(1), unless the authorization is terminated or revoked.  Performed at  [facility], [address] [place] [place], [place] [address]   Blood Culture ID Panel (Reflexed)     Status: Abnormal   Collection Time: 09/19/20 11:26 AM  Result Value Ref Range Status    Enterococcus faecalis NOT DETECTED NOT DETECTED Final   Enterococcus Faecium NOT DETECTED NOT DETECTED Final   Listeria monocytogenes NOT DETECTED NOT DETECTED Final   Staphylococcus species DETECTED (A) NOT DETECTED Final    Comment: CRITICAL RESULT CALLED TO, READ BACK BY AND VERIFIED WITH: Ardelle Balls PHARMD [id number] 09/20/20 A BROWNING    Staphylococcus aureus (BCID) DETECTED (A) NOT DETECTED Final    Comment: Methicillin (oxacillin)-resistant Staphylococcus aureus (MRSA). MRSA is predictably resistant to beta-lactam antibiotics (except ceftaroline). Preferred therapy is vancomycin unless clinically contraindicated. Patient requires contact precautions if  hospitalized. CRITICAL RESULT CALLED TO, READ BACK BY AND VERIFIED WITH: Ardelle Balls PHARMD [id number] 09/20/20 A BROWNING    Staphylococcus epidermidis NOT DETECTED NOT DETECTED Final   Staphylococcus lugdunensis NOT DETECTED NOT DETECTED Final   Streptococcus species NOT DETECTED NOT DETECTED Final   Streptococcus agalactiae NOT DETECTED NOT DETECTED Final   Streptococcus pneumoniae NOT DETECTED NOT DETECTED Final   Streptococcus pyogenes NOT DETECTED NOT DETECTED Final   A.calcoaceticus-baumannii NOT DETECTED NOT DETECTED Final   Bacteroides fragilis NOT DETECTED NOT DETECTED Final   Enterobacterales NOT DETECTED NOT DETECTED Final   Enterobacter cloacae complex NOT DETECTED NOT DETECTED Final   Escherichia coli NOT DETECTED NOT DETECTED Final   Klebsiella aerogenes NOT DETECTED NOT DETECTED Final   Klebsiella oxytoca NOT DETECTED NOT DETECTED Final   Klebsiella pneumoniae NOT DETECTED NOT DETECTED Final   Proteus species NOT DETECTED NOT DETECTED Final   Salmonella species NOT DETECTED NOT DETECTED Final   Serratia marcescens NOT DETECTED NOT DETECTED Final   Haemophilus influenzae NOT DETECTED NOT DETECTED Final   Neisseria meningitidis NOT DETECTED NOT DETECTED Final   Pseudomonas aeruginosa NOT DETECTED NOT DETECTED Final   Stenotrophomonas  maltophilia NOT DETECTED NOT DETECTED Final   Candida albicans NOT DETECTED NOT DETECTED Final   Candida auris NOT DETECTED NOT DETECTED Final   Candida glabrata NOT DETECTED NOT DETECTED Final   Candida krusei NOT DETECTED NOT DETECTED Final   Candida parapsilosis NOT DETECTED NOT DETECTED Final   Candida tropicalis NOT DETECTED NOT DETECTED Final   Cryptococcus neoformans/gattii NOT DETECTED NOT DETECTED Final   Meth resistant mecA/C and MREJ DETECTED (A) NOT DETECTED Final    Comment: CRITICAL RESULT CALLED TO, READ BACK BY AND VERIFIED WITHArdelle Balls PHARMD 7829 09/20/20 A BROWNING Performed at [facility], [address] [place], [place] [address]   Urine culture     Status: Abnormal (Preliminary result)   Collection Time: 09/19/20 11:27 AM   Specimen: Urine, Random  Result Value Ref Range Status   Specimen Description URINE, RANDOM  Final   Special Requests NONE  Final   Culture (A)  Final    50,000 COLONIES/mL PSEUDOMONAS AERUGINOSA 30,000 COLONIES/mL ENTEROCOCCUS FAECALIS CULTURE REINCUBATED FOR BETTER GROWTH Performed at [facility], [address], [place], [place] [address]    Report Status PENDING  Incomplete   Organism ID, Bacteria PSEUDOMONAS AERUGINOSA (A)  Final      Susceptibility   Pseudomonas aeruginosa - MIC*    CEFTAZIDIME 4 SENSITIVE Sensitive     CIPROFLOXACIN <=0.25 SENSITIVE Sensitive     GENTAMICIN <=1 SENSITIVE Sensitive     IMIPENEM 2 SENSITIVE Sensitive     PIP/TAZO  8 SENSITIVE Sensitive     CEFEPIME 2 SENSITIVE Sensitive     * 50,000 COLONIES/mL PSEUDOMONAS AERUGINOSA     Labs: BNP (last 3 results) No results for input(s): BNP in the last 8760 hours. Basic Metabolic Panel: Recent Labs  Lab 09/19/20 1121 09/20/20 0252  NA 132* 133*  K 5.2* 4.8  CL 96* 99  CO2 28 25  GLUCOSE 177* 136*  BUN 17 16  CREATININE 0.50 0.43*  CALCIUM 9.1 8.5*  MG  --  2.0   Liver Function Tests: Recent Labs  Lab 09/19/20 1121 09/20/20 0252   AST 71* 44*  ALT 146* 102*  ALKPHOS 184* 143*  BILITOT 0.8 0.5  PROT 6.5 5.6*  ALBUMIN 1.6* 1.4*   Recent Labs  Lab 09/19/20 1121  LIPASE 30   Recent Labs  Lab 09/19/20 1127  AMMONIA 9   CBC: Recent Labs  Lab 09/19/20 1121 09/20/20 0252  WBC 20.9* 22.5*  NEUTROABS 18.3*  --   HGB 10.4* 9.8*  HCT 34.7* 31.6*  MCV 85.3 82.7  PLT 433* 430*   Cardiac Enzymes: No results for input(s): CKTOTAL, CKMB, CKMBINDEX, TROPONINI in the last 168 hours. BNP: Invalid input(s): POCBNP CBG: Recent Labs  Lab 09/20/20 1554 09/20/20 2123 09/21/20 0339 09/21/20 0745 09/21/20 1226  GLUCAP 238* 187* 188* 179* 200*   D-Dimer No results for input(s): DDIMER in the last 72 hours. Hgb A1c No results for input(s): HGBA1C in the last 72 hours. Lipid Profile No results for input(s): CHOL, HDL, LDLCALC, TRIG, CHOLHDL, LDLDIRECT in the last 72 hours. Thyroid function studies No results for input(s): TSH, T4TOTAL, T3FREE, THYROIDAB in the last 72 hours.  Invalid input(s): FREET3 Anemia work up No results for input(s): VITAMINB12, FOLATE, FERRITIN, TIBC, IRON, RETICCTPCT in the last 72 hours. Urinalysis    Component Value Date/Time   COLORURINE YELLOW 09/19/2020 1127   APPEARANCEUR CLOUDY (A) 09/19/2020 1127   LABSPEC >1.046 (H) 09/19/2020 1127   PHURINE 8.0 09/19/2020 1127   GLUCOSEU NEGATIVE 09/19/2020 1127   HGBUR SMALL (A) 09/19/2020 1127   BILIRUBINUR NEGATIVE 09/19/2020 1127   KETONESUR 5 (A) 09/19/2020 1127   PROTEINUR 30 (A) 09/19/2020 1127   NITRITE NEGATIVE 09/19/2020 1127   LEUKOCYTESUR MODERATE (A) 09/19/2020 1127   Sepsis Labs Invalid input(s): PROCALCITONIN,  WBC,  LACTICIDVEN Microbiology Recent Results (from the past 240 hour(s))  Blood culture (routine x 2)     Status: Abnormal (Preliminary result)   Collection Time: 09/19/20 11:21 AM   Specimen: BLOOD  Result Value Ref Range Status   Specimen Description BLOOD LEFT ANTECUBITAL  Final   Special Requests    Final    BOTTLES DRAWN AEROBIC AND ANAEROBIC Blood Culture results may not be optimal due to an inadequate volume of blood received in culture bottles   Culture  Setup Time   Final    GRAM POSITIVE COCCI IN CLUSTERS ANAEROBIC BOTTLE ONLY CRITICAL VALUE NOTED.  VALUE IS CONSISTENT WITH PREVIOUSLY REPORTED AND CALLED VALUE.    Culture (A)  Final    STAPHYLOCOCCUS AUREUS SUSCEPTIBILITIES PERFORMED ON PREVIOUS CULTURE WITHIN THE LAST 5 DAYS. Performed at [facility], [address] [place], [place] [address]    Report Status PENDING  Incomplete  Blood culture (routine x 2)     Status: Abnormal   Collection Time: 09/19/20 11:26 AM   Specimen: BLOOD LEFT HAND  Result Value Ref Range Status   Specimen Description BLOOD LEFT HAND  Final   Special Requests  Final    BOTTLES DRAWN AEROBIC AND ANAEROBIC Blood Culture results may not be optimal due to an inadequate volume of blood received in culture bottles   Culture  Setup Time   Final    GRAM POSITIVE COCCI AEROBIC BOTTLE ONLY CRITICAL RESULT CALLED TO, READ BACK BY AND VERIFIED WITHArdelle Balls PHARMD 1275 09/20/20 A BROWNING Performed at [facility], [address], [place], [place] [address]    Culture METHICILLIN RESISTANT STAPHYLOCOCCUS AUREUS (A)  Final   Report Status 09/22/2020 FINAL  Final   Organism ID, Bacteria METHICILLIN RESISTANT STAPHYLOCOCCUS AUREUS  Final      Susceptibility   Methicillin resistant staphylococcus aureus - MIC*    CIPROFLOXACIN >=8 RESISTANT Resistant     ERYTHROMYCIN >=8 RESISTANT Resistant     GENTAMICIN <=0.5 SENSITIVE Sensitive     OXACILLIN >=4 RESISTANT Resistant     TETRACYCLINE <=1 SENSITIVE Sensitive     VANCOMYCIN <=0.5 SENSITIVE Sensitive     TRIMETH/SULFA >=320 RESISTANT Resistant     CLINDAMYCIN <=0.25 SENSITIVE Sensitive     RIFAMPIN <=0.5 SENSITIVE Sensitive     Inducible Clindamycin NEGATIVE Sensitive     * METHICILLIN RESISTANT STAPHYLOCOCCUS AUREUS  Resp Panel by RT-PCR  (Flu A&B, Covid) Nasopharyngeal Swab     Status: Abnormal   Collection Time: 09/19/20 11:26 AM   Specimen: Nasopharyngeal Swab; Nasopharyngeal(NP) swabs in vial transport medium  Result Value Ref Range Status   SARS Coronavirus 2 by RT PCR POSITIVE (A) NEGATIVE Final    Comment: RESULT CALLED TO, READ BACK BY AND VERIFIED WITH: A,BANKS @1623  09/19/20 EB (NOTE) SARS-CoV-2 target nucleic acids are DETECTED.  The SARS-CoV-2 RNA is generally detectable in upper respiratory specimens during the acute phase of infection. Positive results are indicative of the presence of the identified virus, but do not rule out bacterial infection or co-infection with other pathogens not detected by the test. Clinical correlation with patient history and other diagnostic information is necessary to determine patient infection status. The expected result is Negative.  Fact Sheet for Patients: 09/21/20  Fact Sheet for Healthcare Providers: BloggerCourse.com  This test is not yet approved or cleared by the [employer] FDA and  has been authorized for detection and/or diagnosis of SARS-CoV-2 by FDA under an Emergency Use Authorization (EUA).  This EUA will remain in effect (meaning this test can be used) fo r the duration of  the COVID-19 declaration under Section 564(b)(1) of the Act, 21 U.S.C. section 360bbb-3(b)(1), unless the authorization is terminated or revoked sooner.     Influenza A by PCR NEGATIVE NEGATIVE Final   Influenza B by PCR NEGATIVE NEGATIVE Final    Comment: (NOTE) The Xpert Xpress SARS-CoV-2/FLU/RSV plus assay is intended as an aid in the diagnosis of influenza from Nasopharyngeal swab specimens and should not be used as a sole basis for treatment. Nasal washings and aspirates are unacceptable for Xpert Xpress SARS-CoV-2/FLU/RSV testing.  Fact Sheet for Patients: [place]  Fact Sheet for  Healthcare Providers: BloggerCourse.com  This test is not yet approved or cleared by the [employer] FDA and has been authorized for detection and/or diagnosis of SARS-CoV-2 by FDA under an Emergency Use Authorization (EUA). This EUA will remain in effect (meaning this test can be used) for the duration of the COVID-19 declaration under Section 564(b)(1) of the Act, 21 U.S.C. section 360bbb-3(b)(1), unless the authorization is terminated or revoked.  Performed at [facility], [address] [place], [place]  Blood Culture ID Panel (Reflexed)     Status: Abnormal   Collection Time: 09/19/20 11:26 AM  Result Value Ref Range Status   Enterococcus faecalis NOT DETECTED NOT DETECTED Final   Enterococcus Faecium NOT DETECTED NOT DETECTED Final   Listeria monocytogenes NOT DETECTED NOT DETECTED Final   Staphylococcus species DETECTED (A) NOT DETECTED Final    Comment: CRITICAL RESULT CALLED TO, READ BACK BY AND VERIFIED WITHArdelle Balls PHARMD 1652 09/20/20 A BROWNING    Staphylococcus aureus (BCID) DETECTED (A) NOT DETECTED Final    Comment: Methicillin (oxacillin)-resistant Staphylococcus aureus (MRSA). MRSA is predictably resistant to beta-lactam antibiotics (except ceftaroline). Preferred therapy is vancomycin unless clinically contraindicated. Patient requires contact precautions if  hospitalized. CRITICAL RESULT CALLED TO, READ BACK BY AND VERIFIED WITH: Ardelle Balls PHARMD [id number] 09/20/20 A BROWNING    Staphylococcus epidermidis NOT DETECTED NOT DETECTED Final   Staphylococcus lugdunensis NOT DETECTED NOT DETECTED Final   Streptococcus species NOT DETECTED NOT DETECTED Final   Streptococcus agalactiae NOT DETECTED NOT DETECTED Final   Streptococcus pneumoniae NOT DETECTED NOT DETECTED Final   Streptococcus pyogenes NOT DETECTED NOT DETECTED Final   A.calcoaceticus-baumannii NOT DETECTED NOT DETECTED Final   Bacteroides fragilis NOT DETECTED NOT  DETECTED Final   Enterobacterales NOT DETECTED NOT DETECTED Final   Enterobacter cloacae complex NOT DETECTED NOT DETECTED Final   Escherichia coli NOT DETECTED NOT DETECTED Final   Klebsiella aerogenes NOT DETECTED NOT DETECTED Final   Klebsiella oxytoca NOT DETECTED NOT DETECTED Final   Klebsiella pneumoniae NOT DETECTED NOT DETECTED Final   Proteus species NOT DETECTED NOT DETECTED Final   Salmonella species NOT DETECTED NOT DETECTED Final   Serratia marcescens NOT DETECTED NOT DETECTED Final   Haemophilus influenzae NOT DETECTED NOT DETECTED Final   Neisseria meningitidis NOT DETECTED NOT DETECTED Final   Pseudomonas aeruginosa NOT DETECTED NOT DETECTED Final   Stenotrophomonas maltophilia NOT DETECTED NOT DETECTED Final   Candida albicans NOT DETECTED NOT DETECTED Final   Candida auris NOT DETECTED NOT DETECTED Final   Candida glabrata NOT DETECTED NOT DETECTED Final   Candida krusei NOT DETECTED NOT DETECTED Final   Candida parapsilosis NOT DETECTED NOT DETECTED Final   Candida tropicalis NOT DETECTED NOT DETECTED Final   Cryptococcus neoformans/gattii NOT DETECTED NOT DETECTED Final   Meth resistant mecA/C and MREJ DETECTED (A) NOT DETECTED Final    Comment: CRITICAL RESULT CALLED TO, READ BACK BY AND VERIFIED WITHArdelle Balls PHARMD 1478 09/20/20 A BROWNING Performed at [facility], [address] [place], [place] [address]   Urine culture     Status: Abnormal (Preliminary result)   Collection Time: 09/19/20 11:27 AM   Specimen: Urine, Random  Result Value Ref Range Status   Specimen Description URINE, RANDOM  Final   Special Requests NONE  Final   Culture (A)  Final    50,000 COLONIES/mL PSEUDOMONAS AERUGINOSA 30,000 COLONIES/mL ENTEROCOCCUS FAECALIS CULTURE REINCUBATED FOR BETTER GROWTH Performed at [facility], [address], [place], [place] [address]    Report Status PENDING  Incomplete   Organism ID, Bacteria PSEUDOMONAS AERUGINOSA (A)  Final       Susceptibility   Pseudomonas aeruginosa - MIC*    CEFTAZIDIME 4 SENSITIVE Sensitive     CIPROFLOXACIN <=0.25 SENSITIVE Sensitive     GENTAMICIN <=1 SENSITIVE Sensitive     IMIPENEM 2 SENSITIVE Sensitive     PIP/TAZO 8 SENSITIVE Sensitive     CEFEPIME 2 SENSITIVE Sensitive     *  50,000 COLONIES/mL PSEUDOMONAS AERUGINOSA     Time coordinating discharge: Over 30 minutes  SIGNED:   Azucena FallenWilliam C Holland Nickson, DO [facility] 09/22/2020, 3:42 PM Pager   If 7PM-7AM, please contact night-coverage [email or web address]

## 2020-09-23 DIAGNOSIS — R627 Adult failure to thrive: Secondary | ICD-10-CM | POA: Diagnosis not present

## 2020-09-23 DIAGNOSIS — E43 Unspecified severe protein-calorie malnutrition: Secondary | ICD-10-CM | POA: Diagnosis not present

## 2020-09-23 DIAGNOSIS — F0390 Unspecified dementia without behavioral disturbance: Secondary | ICD-10-CM | POA: Diagnosis not present

## 2020-09-23 DIAGNOSIS — I10 Essential (primary) hypertension: Secondary | ICD-10-CM | POA: Diagnosis not present

## 2020-09-23 LAB — CULTURE, BLOOD (ROUTINE X 2)

## 2020-09-24 LAB — URINE CULTURE: Culture: 50000 — AB

## 2020-09-27 DIAGNOSIS — R338 Other retention of urine: Secondary | ICD-10-CM | POA: Diagnosis not present

## 2020-09-28 DIAGNOSIS — F0390 Unspecified dementia without behavioral disturbance: Secondary | ICD-10-CM | POA: Diagnosis not present

## 2020-09-28 DIAGNOSIS — R627 Adult failure to thrive: Secondary | ICD-10-CM | POA: Diagnosis not present

## 2020-09-28 DIAGNOSIS — G894 Chronic pain syndrome: Secondary | ICD-10-CM | POA: Diagnosis not present

## 2020-09-30 DIAGNOSIS — E1169 Type 2 diabetes mellitus with other specified complication: Secondary | ICD-10-CM | POA: Diagnosis not present

## 2020-09-30 DIAGNOSIS — I10 Essential (primary) hypertension: Secondary | ICD-10-CM | POA: Diagnosis not present

## 2020-09-30 DIAGNOSIS — R627 Adult failure to thrive: Secondary | ICD-10-CM | POA: Diagnosis not present

## 2020-09-30 DIAGNOSIS — F0390 Unspecified dementia without behavioral disturbance: Secondary | ICD-10-CM | POA: Diagnosis not present

## 2020-10-05 DIAGNOSIS — G894 Chronic pain syndrome: Secondary | ICD-10-CM | POA: Diagnosis not present

## 2020-10-05 DIAGNOSIS — R627 Adult failure to thrive: Secondary | ICD-10-CM | POA: Diagnosis not present

## 2020-10-05 DIAGNOSIS — E43 Unspecified severe protein-calorie malnutrition: Secondary | ICD-10-CM | POA: Diagnosis not present

## 2020-10-05 DIAGNOSIS — L89154 Pressure ulcer of sacral region, stage 4: Secondary | ICD-10-CM | POA: Diagnosis not present

## 2020-10-05 DIAGNOSIS — Z515 Encounter for palliative care: Secondary | ICD-10-CM | POA: Diagnosis not present

## 2020-10-08 DIAGNOSIS — G894 Chronic pain syndrome: Secondary | ICD-10-CM | POA: Diagnosis not present

## 2020-10-08 DIAGNOSIS — R627 Adult failure to thrive: Secondary | ICD-10-CM | POA: Diagnosis not present

## 2020-10-08 DIAGNOSIS — F0390 Unspecified dementia without behavioral disturbance: Secondary | ICD-10-CM | POA: Diagnosis not present

## 2022-11-29 IMAGING — CR DG HIP (WITH OR WITHOUT PELVIS) 2-3V*R*
3 series · 3 of 3 positions shown · non-contrast
Comparison: Prior radiograph of the pelvis from earlier the same
day.

CLINICAL DATA: Initial evaluation for acute trauma, fall.

EXAM:
DG HIP (WITH OR WITHOUT PELVIS) 2-3V RIGHT

[pelvis ap]
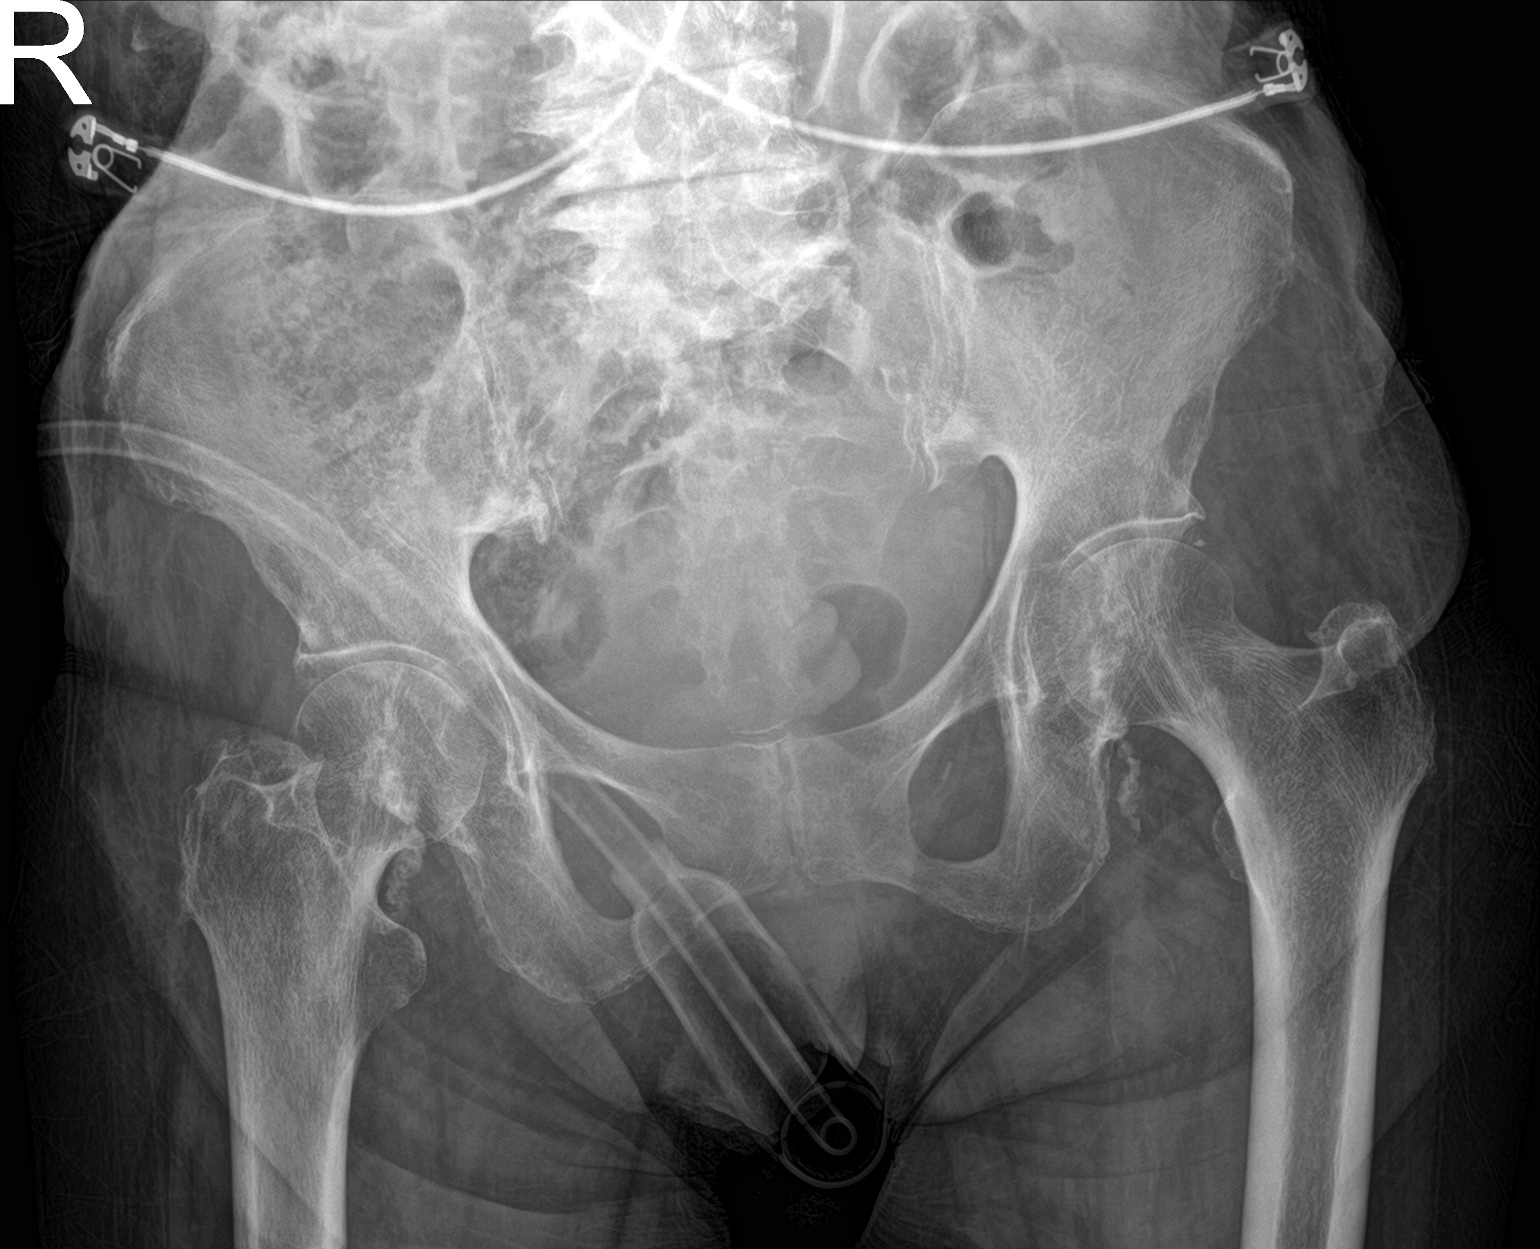

[hip ap]
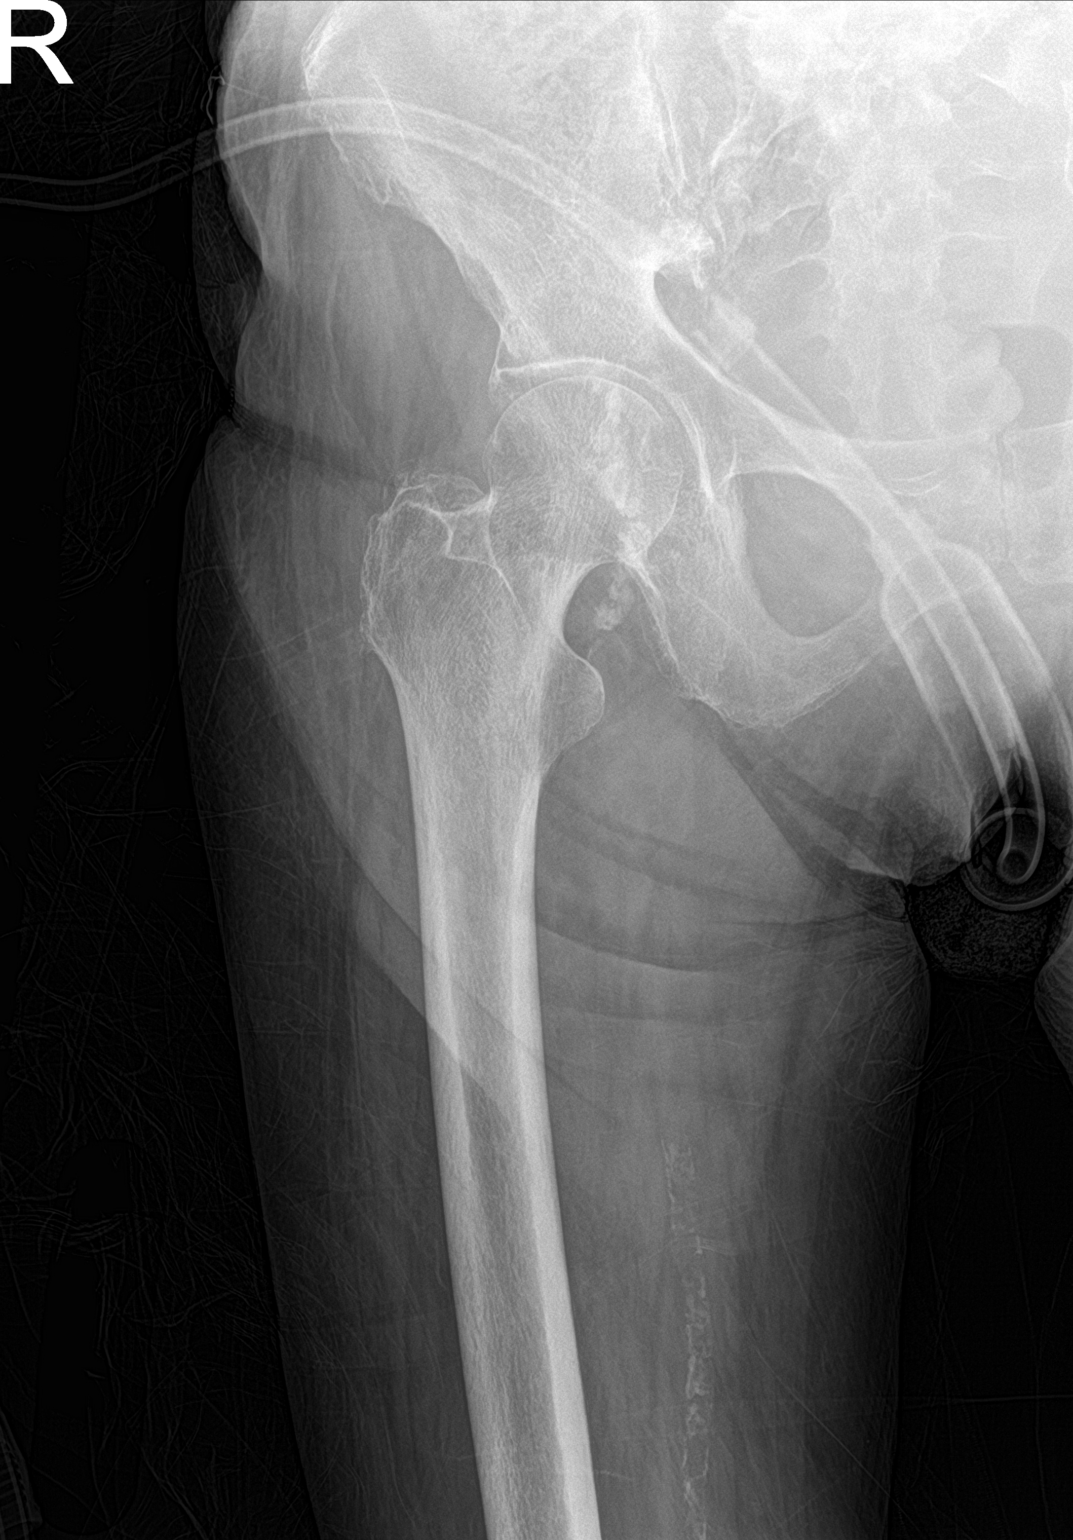

[hip lat]
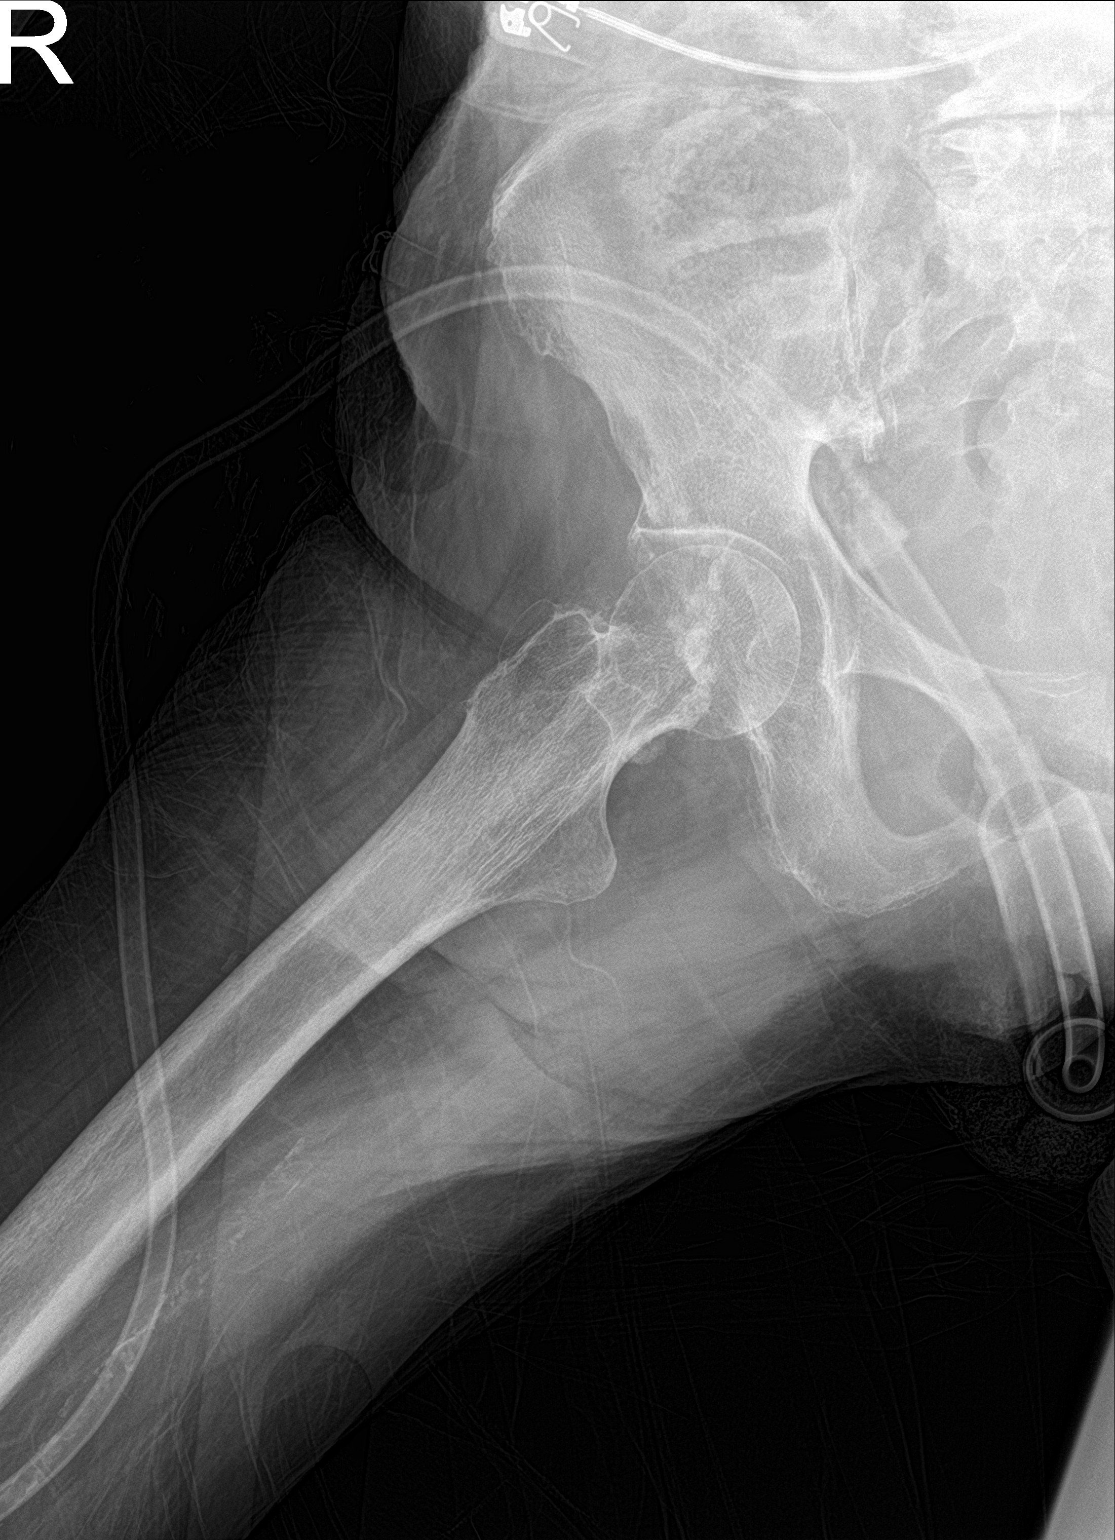

[3 of 3 positions shown; findings below may reference images not displayed]

FINDINGS: Additional views of the right hip demonstrate no acute fracture or
dislocation. Femoral head in normal alignment within the acetabulum.
Femoral head height maintained. Visualized bony pelvis intact. Mild
osteoarthritic changes noted about the hips. No visible soft tissue
injury. Advanced degenerative spondylosis again noted within the
lower lumbar spine. Vascular calcifications overlying the pelvis and
thigh noted.
IMPRESSION: 1. No acute fracture or dislocation.
2. Previously questioned cortical irregularity is not seen on these
additional views of the right hip. This was likely positional on
prior exam.

## 2022-11-29 IMAGING — CT CT CERVICAL SPINE W/O CM
3 of 4 series · 13 of 33 positions shown, 16 images · non-contrast
Comparison: Same day MRI of the cervical spine.

CLINICAL DATA: Spinal stenosis. Concern for hemorrhage on same day
MRI of the cervical spine.

EXAM:
CT CERVICAL SPINE WITHOUT CONTRAST
TECHNIQUE: Multidetector CT imaging of the cervical spine was performed without
intravenous contrast. Multiplanar CT image reconstructions were also
generated.

[Series 4: c_spine 2.0 st · axial · 0.27mm/px · z∈[-681,-555]mm · 5 of 95 slices shown, 7 images]
[im 16/95  soft-tissue]
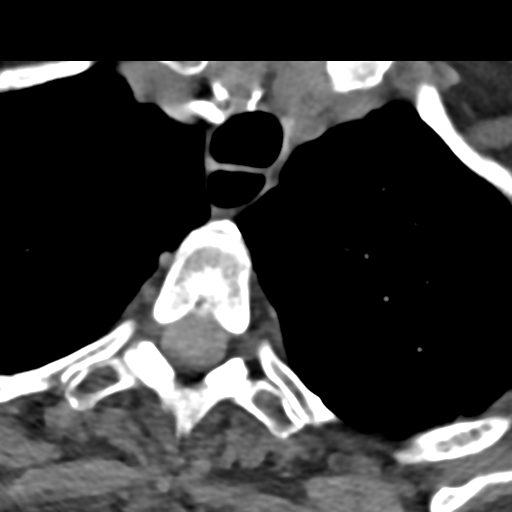
[im 16/95  bone]
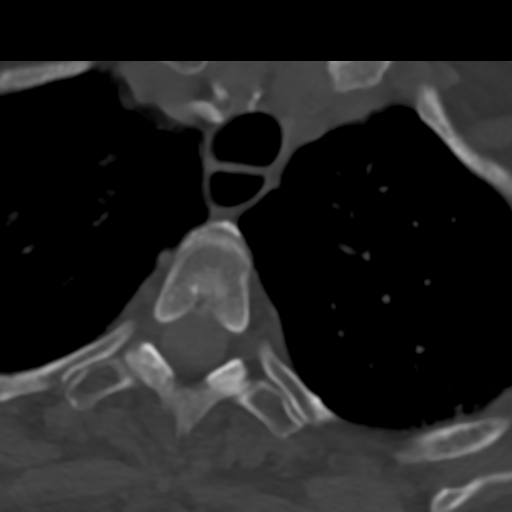
[im 32/95  bone]
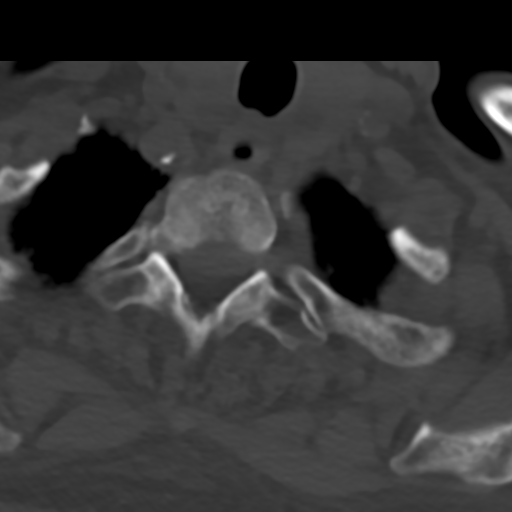
[im 48/95  bone]
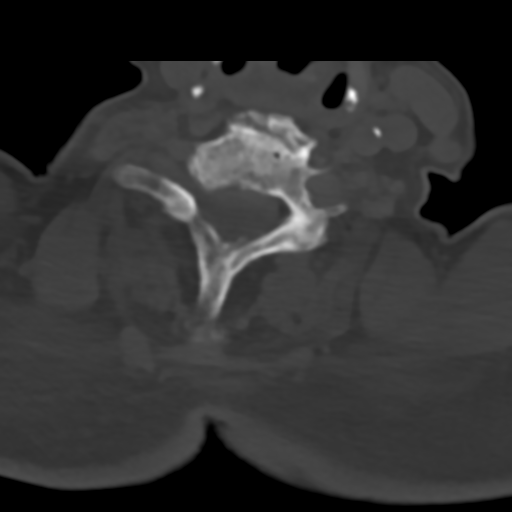
[im 63/95  bone]
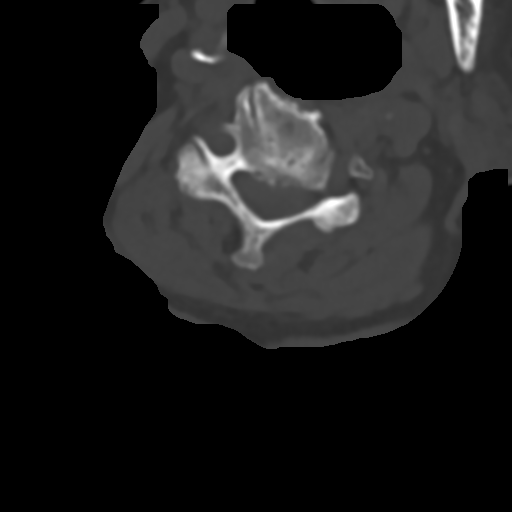
[im 79/95  soft-tissue]
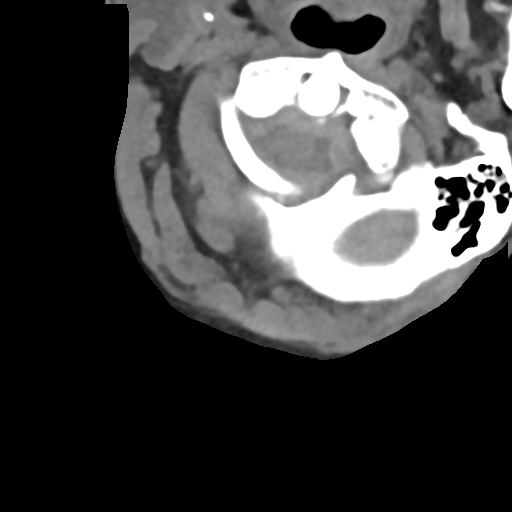
[im 79/95  bone]
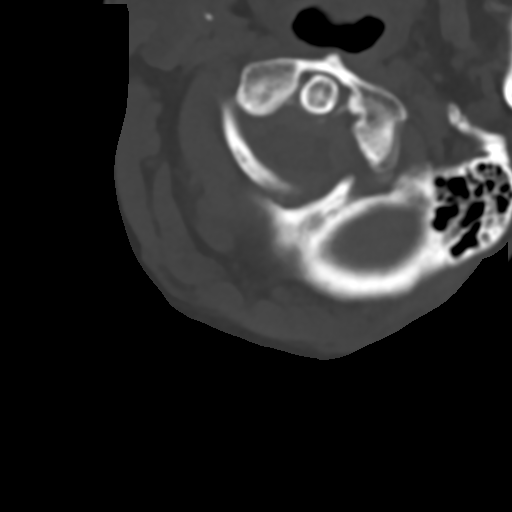

[Series 8: c_spine 2.0 sag bone · sagittal · 0.31mm/px · 5 of 61 slices shown, 6 images]
[im 21/61  bone]
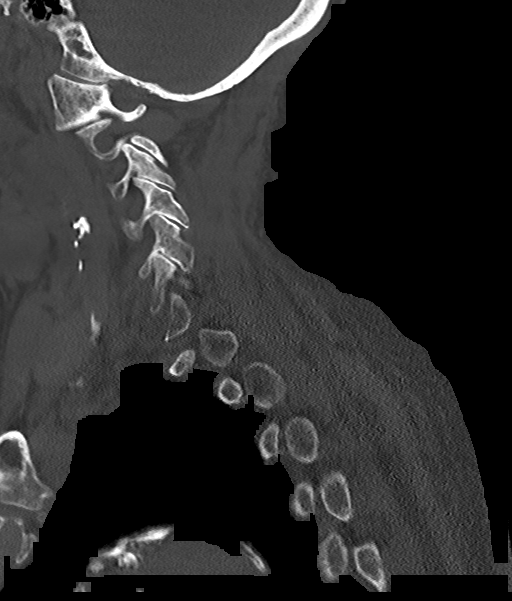
[im 26/61  bone]
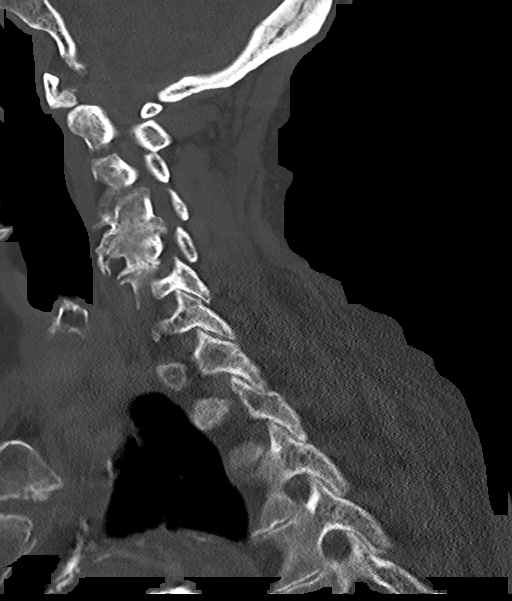
[im 31/61  soft-tissue]
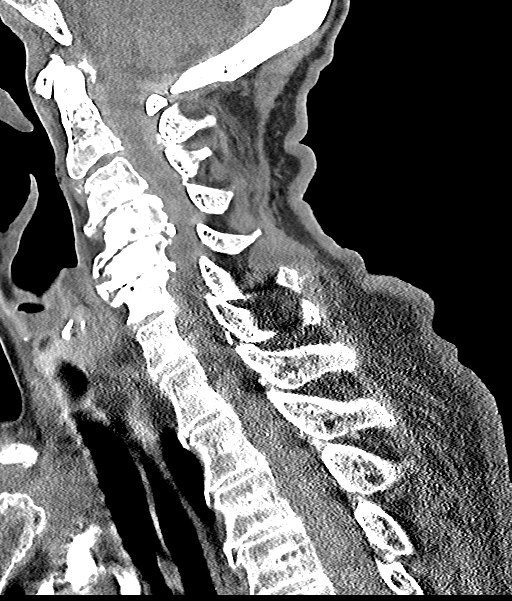
[im 31/61  bone]
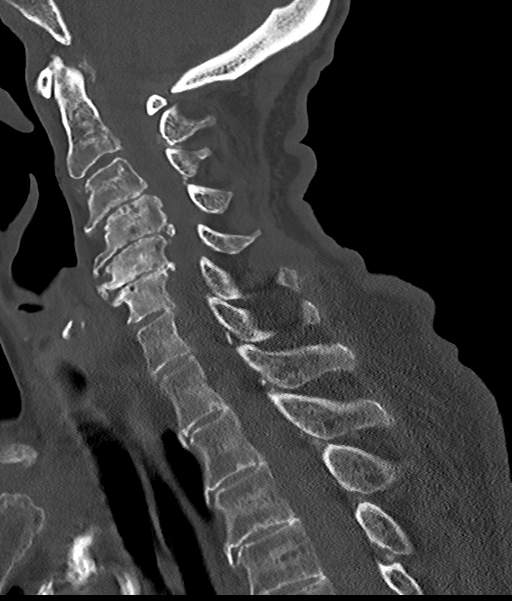
[im 36/61  bone]
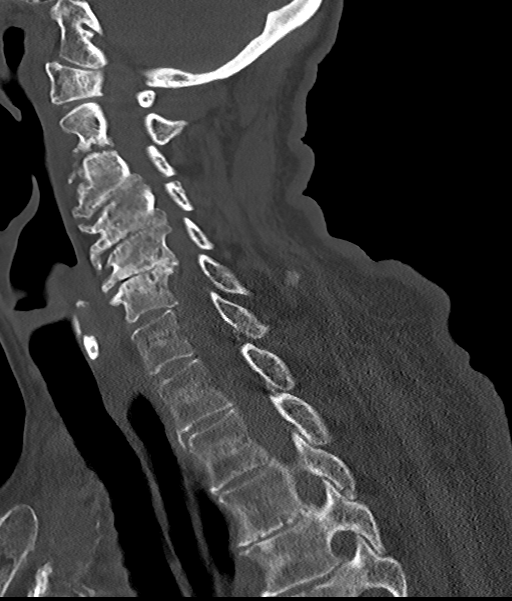
[im 41/61  bone]
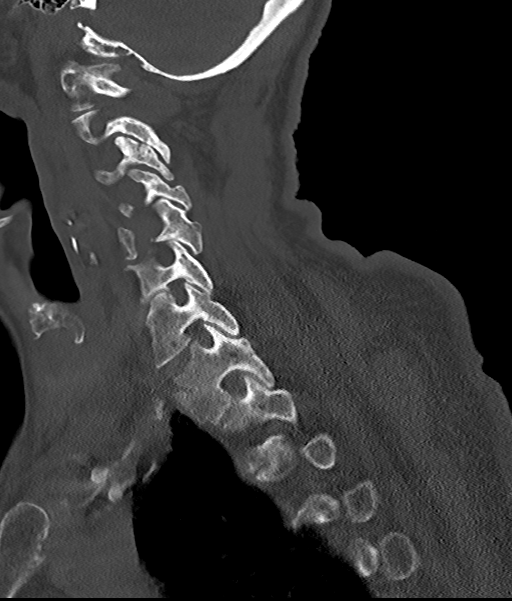

[Series 9: c_spine 2.0 cor bone · coronal · 0.24mm/px · 3 of 61 slices shown]
[im 13/61  bone]
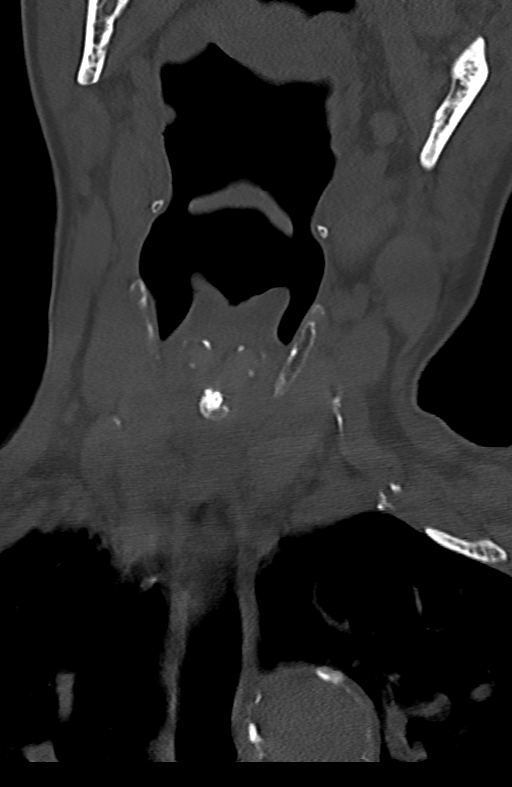
[im 25/61  bone]
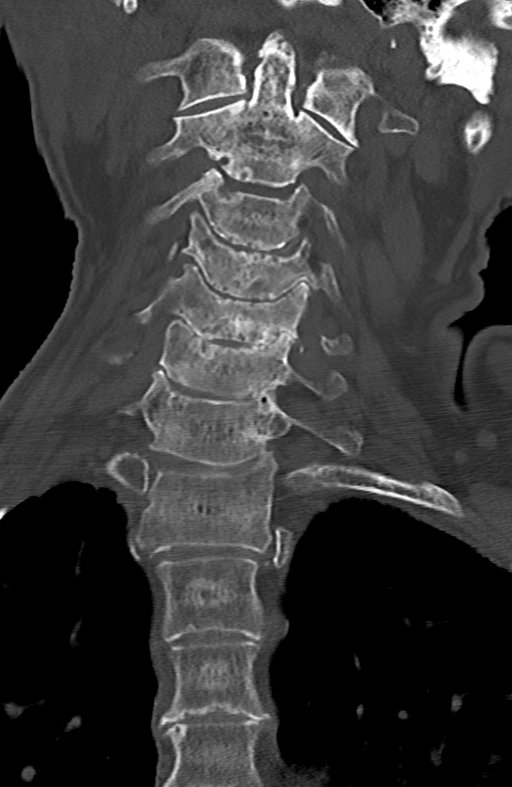
[im 37/61  bone]
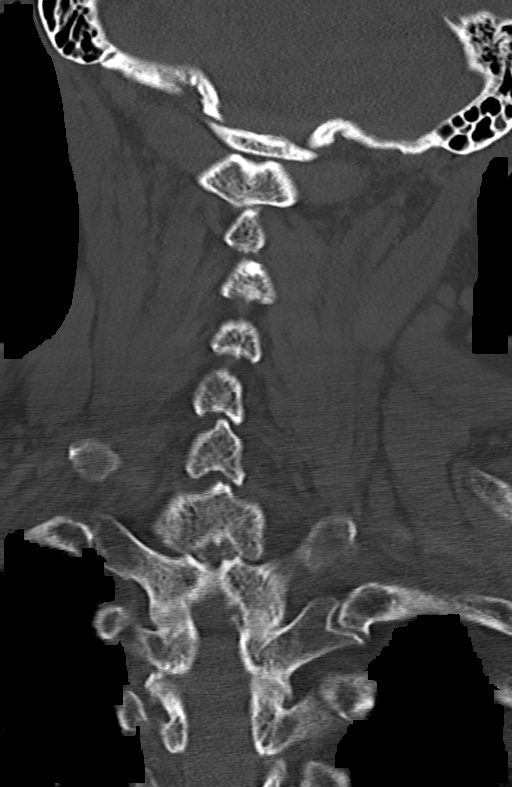

[13 of 33 positions shown; findings below may reference images not displayed]

FINDINGS: Alignment: Similar reversal of the normal cervical lordosis. Slight
anterolisthesis of C3 on C4 and retrolisthesis of C4 on C5, favor
degenerative given severe degenerative changes at these levels.
Dextrocurvature.

Skull base and vertebrae: No evidence of acute fracture. Vertebral
body heights are maintained.

Soft tissues and spinal canal: Ventral epidural abnormality seen on
same day MRI is hyperdense on CT (for example see series 11, image
43) and does not correlate with bone, compatible with hemorrhage.
Additional hyperdensity in the canal more superiorly at the foramen
magnum/craniocervical junction, suggestive of hemorrhage.

Disc levels: Please see same day MRI for characterization of
multilevel degenerative change, including multilevel severe canal
stenosis.

Upper chest: Limited visualization lung apices without evidence of
consolidation.

Other: Calcific atherosclerosis of the carotid arteries.
IMPRESSION: 1. Ventral epidural abnormality seen on same day MRI is hyperdense
on CT and does not correlate with bone, compatible with acute/recent
hemorrhage. Additional canal hemorrhage is noted more superiorly at
the foramen magnum/craniocervical junction.
2. Please see same day MRI for characterization of multilevel severe
canal stenosis and cord edema.
3. No evidence of acute fracture.

## 2022-11-29 IMAGING — MR MR HEAD W/O CM
12 of 13 series · 43 of 48 positions shown · non-contrast
Comparison: 01/21/2014

CLINICAL DATA: Increasing weakness in the right arm and leg.

EXAM:
MRI HEAD WITHOUT CONTRAST
TECHNIQUE: Multiplanar, multiecho pulse sequences of the brain and surrounding
structures were obtained without intravenous contrast.

[Series 5: DWI · axial · 3.0mm · 0.88mm/px · z∈[-98,+42]mm · 6 of 96 slices shown (1 of 4)]
[im 1/96]
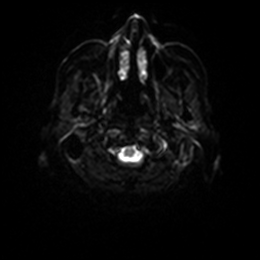
[im 20/96]
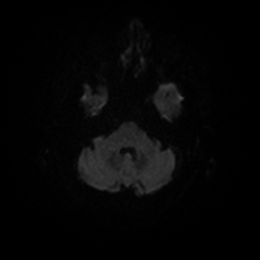
[im 39/96]
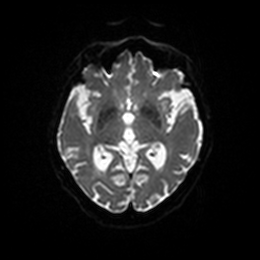
[im 58/96]
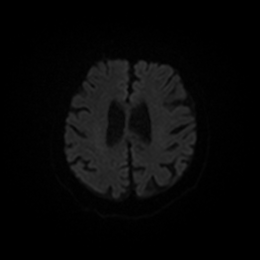
[im 77/96]
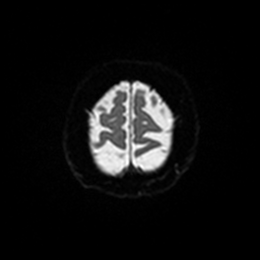
[im 96/96]
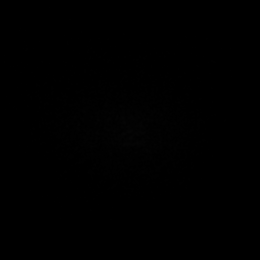

[Series 6: DWI · axial · 3.0mm · 0.88mm/px · z∈[-98,+42]mm · 4 of 48 slices shown (2 of 4)]
[im 1/48]
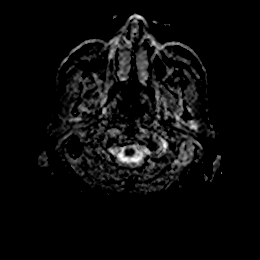
[im 16/48]
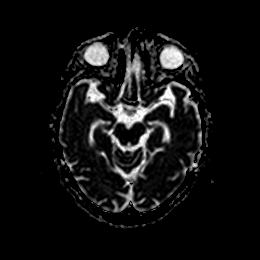
[im 32/48]
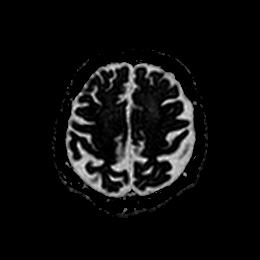
[im 48/48]
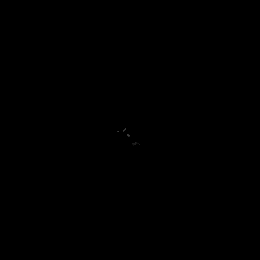

[Series 7: DWI · coronal · 4.0mm · 0.88mm/px · 5 of 64 slices shown (3 of 4)]
[im 1/64]
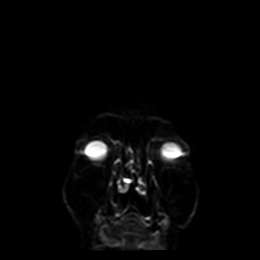
[im 16/64]
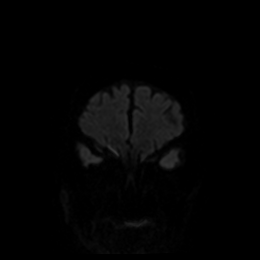
[im 32/64]
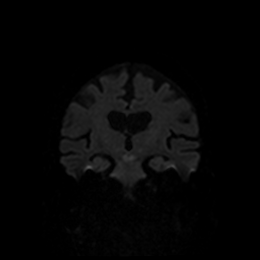
[im 48/64]
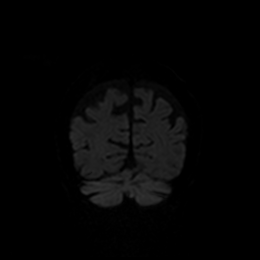
[im 64/64]
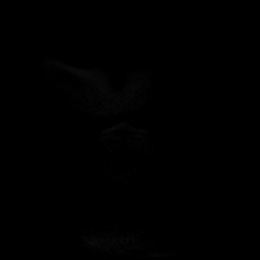

[Series 8: DWI · coronal · 4.0mm · 0.88mm/px · 2 of 32 slices shown (4 of 4)]
[im 1/32]
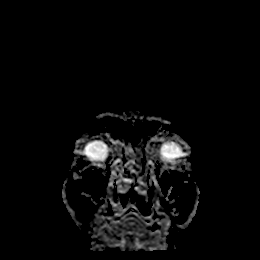
[im 32/32]
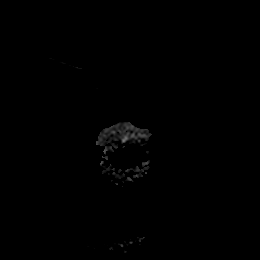

[Series 9: T1 · sagittal · 5.0mm · 0.75mm/px · 2 of 23 slices shown]
[im 1/23]
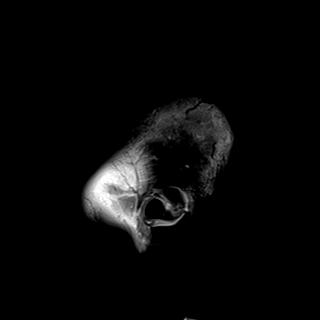
[im 23/23]
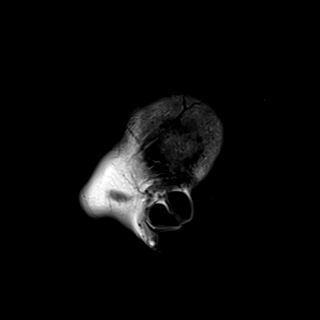

[Series 10: T2 · axial · 5.0mm · 0.72mm/px · z∈[-100,+43]mm · 2 of 25 slices shown (1 of 2)]
[im 1/25]
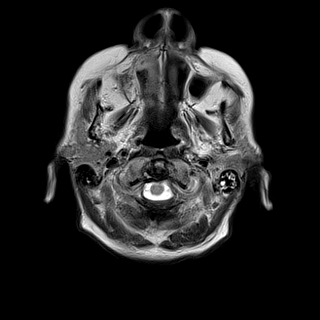
[im 25/25]
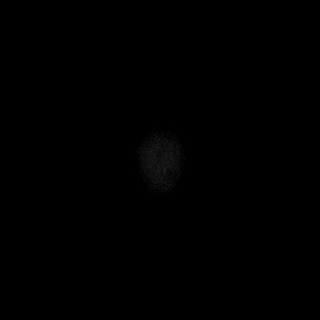

[Series 11: FLAIR · axial · 5.0mm · 0.45mm/px · z∈[-99,+44]mm · 2 of 25 slices shown]
[im 1/25]
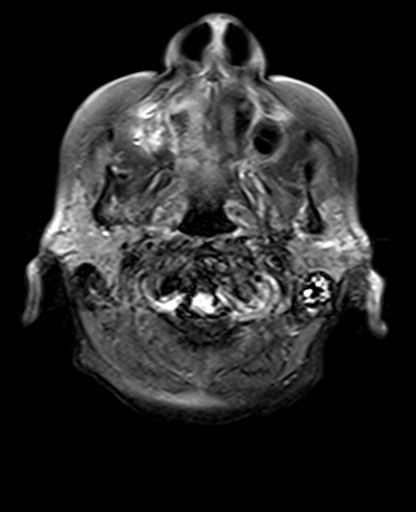
[im 25/25]
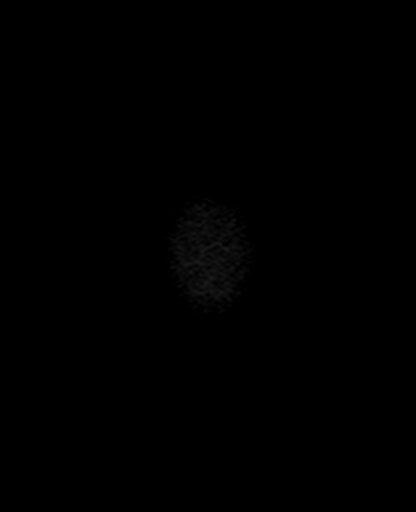

[Series 12: mag_images · axial · 3.0mm · 0.90mm/px · z∈[-115,+61]mm · 5 of 60 slices shown]
[im 1/60]
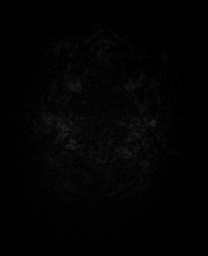
[im 15/60]
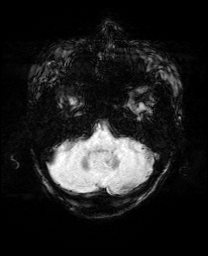
[im 30/60]
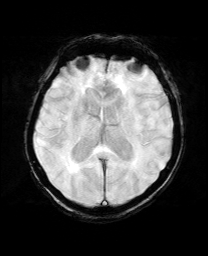
[im 45/60]
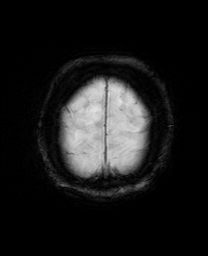
[im 60/60]
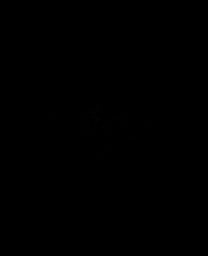

[Series 13: pha_images · axial · 3.0mm · 0.90mm/px · z∈[-112,+46]mm · 4 of 54 slices shown]
[im 1/54]
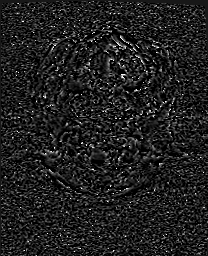
[im 18/54]
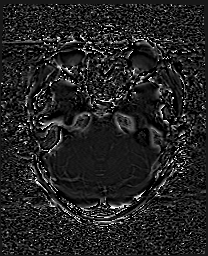
[im 36/54]
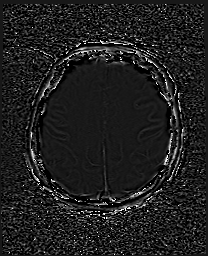
[im 54/54]
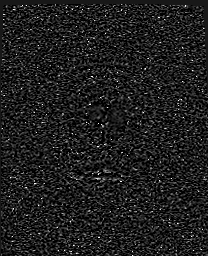

[Series 14: swi_images · axial · 3.0mm · 0.90mm/px · z∈[-115,+61]mm · 5 of 60 slices shown]
[im 1/60]
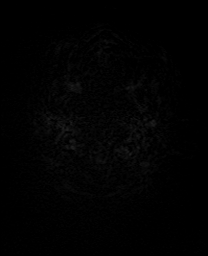
[im 15/60]
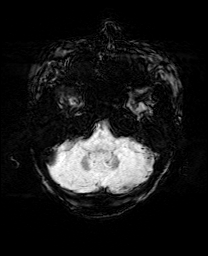
[im 30/60]
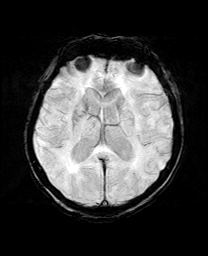
[im 45/60]
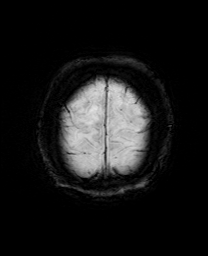
[im 60/60]
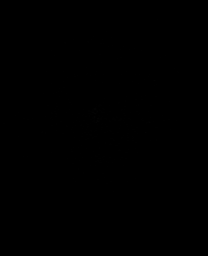

[Series 15: mip_images(sw) · axial · 24.0mm · 0.90mm/px · z∈[-105,+50]mm · 4 of 53 slices shown]
[im 1/53]
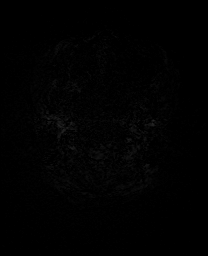
[im 18/53]
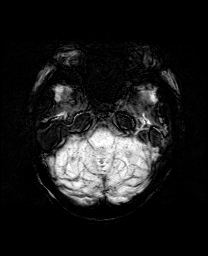
[im 35/53]
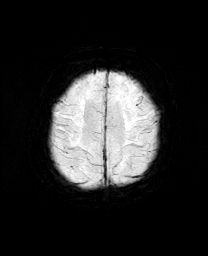
[im 53/53]
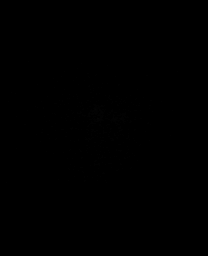

[Series 17: T2 · coronal · 5.0mm · 0.34mm/px · 2 of 29 slices shown (2 of 2)]
[im 1/29]
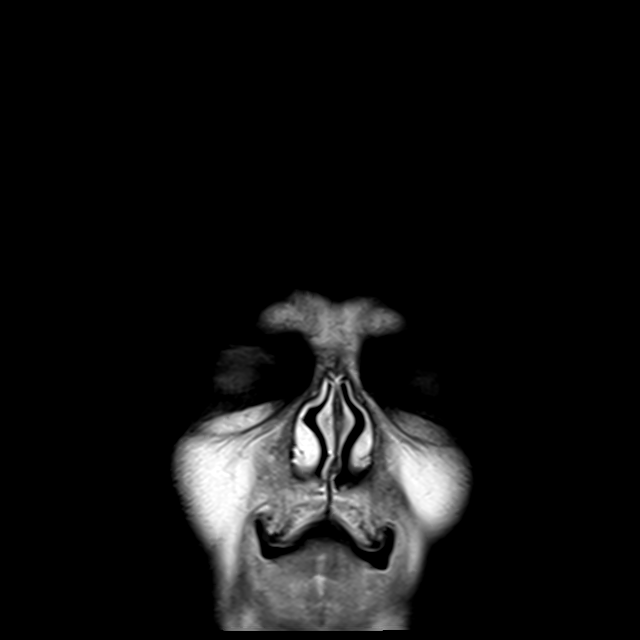
[im 29/29]
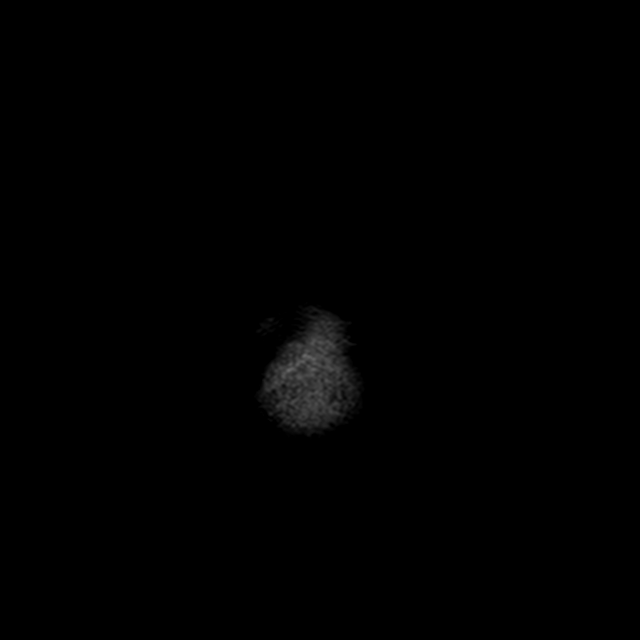

[43 of 48 positions shown; findings below may reference images not displayed]

FINDINGS: Brain: No convincing infarction, hemorrhage, hydrocephalus,
extra-axial collection or mass lesion. Minimal diffusion
hyperintensity in the region of the lower left thalamus on coronal
acquisition is not confirmed on the axial diffusion or other
sequences.

Cerebral volume which has progressed from 3251. Chronic small vessel
ischemic change in the deep cerebral white matter that is mild for
age, also mildly progressed. Clustered CSF intensity cystic spaces
along the body and splenium of the corpus callosum on the left,
stable and attributed to dilated perivascular spaces.

Vascular: The left cervical ICA is difficult to visualize on T2
weighted imaging but appears stable and symmetric on T1 weighted
imaging when compared with prior.

Skull and upper cervical spine: Advanced cervical spine degeneration
with probable cord impingement at C2-3, C3-4, and C4-5.

Sinuses/Orbits: No emergent finding. Bilateral cataract resection.
Partial left mastoid opacification with negative nasopharynx.
IMPRESSION: 1. No acute intracranial finding.
2. Brain involution that has progressed from 3251.
3. Cervical spine degeneration with multilevel upper cord
impingement.
4. Partial left mastoid opacification with negative nasopharynx.
